# Patient Record
Sex: Male | Born: 1965
Health system: Southern US, Community
[De-identification: ages and names within clinical notes are randomized; demographics above are authoritative.]

## PROBLEM LIST (undated history)

## (undated) DIAGNOSIS — I10 Essential (primary) hypertension: Secondary | ICD-10-CM

## (undated) DIAGNOSIS — E785 Hyperlipidemia, unspecified: Secondary | ICD-10-CM

## (undated) HISTORY — DX: Hyperlipidemia, unspecified: E78.5

## (undated) HISTORY — DX: Essential (primary) hypertension: I10

---

## 2000-04-25 ENCOUNTER — Encounter: Payer: Self-pay | Admitting: Emergency Medicine

## 2000-04-25 ENCOUNTER — Emergency Department (HOSPITAL_COMMUNITY): Admission: EM | Admit: 2000-04-25 | Discharge: 2000-04-25 | Payer: Self-pay | Admitting: Emergency Medicine

## 2003-08-20 ENCOUNTER — Emergency Department (HOSPITAL_COMMUNITY): Admission: EM | Admit: 2003-08-20 | Discharge: 2003-08-21 | Payer: Self-pay | Admitting: Emergency Medicine

## 2006-11-23 ENCOUNTER — Ambulatory Visit: Payer: Self-pay | Admitting: Cardiovascular Disease

## 2006-11-24 ENCOUNTER — Ambulatory Visit: Payer: Self-pay | Admitting: Cardiovascular Disease

## 2006-11-24 LAB — CONVERTED CEMR LAB
ALT: 24 units/L (ref 0–40)
AST: 23 units/L (ref 0–37)
Albumin: 3.9 g/dL (ref 3.5–5.2)
Alkaline Phosphatase: 43 units/L (ref 39–117)
BUN: 8 mg/dL (ref 6–23)
Basophils Absolute: 0.1 10*3/uL (ref 0.0–0.1)
Basophils Relative: 0.8 % (ref 0.0–1.0)
Bilirubin, Direct: 0.1 mg/dL (ref 0.0–0.3)
CO2: 30 meq/L (ref 19–32)
Calcium: 9.7 mg/dL (ref 8.4–10.5)
Chloride: 102 meq/L (ref 96–112)
Cholesterol: 234 mg/dL (ref 0–200)
Creatinine, Ser: 1 mg/dL (ref 0.4–1.5)
Direct LDL: 79.8 mg/dL
Eosinophils Absolute: 0.2 10*3/uL (ref 0.0–0.6)
Eosinophils Relative: 2.9 % (ref 0.0–5.0)
GFR calc Af Amer: 106 mL/min
GFR calc non Af Amer: 88 mL/min
Glucose, Bld: 113 mg/dL — ABNORMAL HIGH (ref 70–99)
HCT: 41.5 % (ref 39.0–52.0)
HDL: 30.9 mg/dL — ABNORMAL LOW (ref >39.0)
Hemoglobin: 14.2 g/dL (ref 13.0–17.0)
Lymphocytes Relative: 48.7 % — ABNORMAL HIGH (ref 12.0–46.0)
MCHC: 34.2 g/dL (ref 30.0–36.0)
MCV: 81.8 fL (ref 78.0–100.0)
Monocytes Absolute: 0.7 10*3/uL (ref 0.2–0.7)
Monocytes Relative: 9.1 % (ref 3.0–11.0)
Neutro Abs: 2.9 10*3/uL (ref 1.4–7.7)
Neutrophils Relative %: 38.5 % — ABNORMAL LOW (ref 43.0–77.0)
Platelets: 326 10*3/uL (ref 150–400)
Potassium: 3.6 meq/L (ref 3.5–5.1)
RBC: 5.07 M/uL (ref 4.22–5.81)
RDW: 12.7 % (ref 11.5–14.6)
Sodium: 138 meq/L (ref 135–145)
TSH: 2.44 microintl units/mL (ref 0.35–5.50)
Total Bilirubin: 0.7 mg/dL (ref 0.3–1.2)
Total CHOL/HDL Ratio: 7.6
Total Protein: 7.5 g/dL (ref 6.0–8.3)
Triglycerides: 856 mg/dL (ref 0–149)
VLDL: 171 mg/dL — ABNORMAL HIGH (ref 0–40)
WBC: 7.6 10*3/uL (ref 4.5–10.5)

## 2006-12-07 ENCOUNTER — Ambulatory Visit: Payer: Self-pay | Admitting: Cardiovascular Disease

## 2007-03-06 ENCOUNTER — Ambulatory Visit: Payer: Self-pay | Admitting: Cardiovascular Disease

## 2007-03-06 ENCOUNTER — Ambulatory Visit: Payer: Self-pay

## 2007-03-06 LAB — CONVERTED CEMR LAB
ALT: 24 units/L (ref 0–40)
AST: 27 units/L (ref 0–37)
Albumin: 3.8 g/dL (ref 3.5–5.2)
Alkaline Phosphatase: 47 units/L (ref 39–117)
Bilirubin, Direct: 0.1 mg/dL (ref 0.0–0.3)
Cholesterol: 248 mg/dL (ref 0–200)
Direct LDL: 67.2 mg/dL
HDL: 27.4 mg/dL — ABNORMAL LOW (ref >39.0)
Total Bilirubin: 0.7 mg/dL (ref 0.3–1.2)
Total CHOL/HDL Ratio: 9.1
Total Protein: 7.8 g/dL (ref 6.0–8.3)
Triglycerides: 939 mg/dL (ref 0–149)
VLDL: 188 mg/dL — ABNORMAL HIGH (ref 0–40)

## 2008-06-24 ENCOUNTER — Ambulatory Visit: Payer: Self-pay | Admitting: Cardiovascular Disease

## 2008-06-27 ENCOUNTER — Ambulatory Visit: Payer: Self-pay | Admitting: Cardiovascular Disease

## 2008-06-27 LAB — CONVERTED CEMR LAB
ALT: 23 units/L (ref 0–53)
AST: 25 units/L (ref 0–37)
Albumin: 4.2 g/dL (ref 3.5–5.2)
Alkaline Phosphatase: 37 units/L — ABNORMAL LOW (ref 39–117)
BUN: 15 mg/dL (ref 6–23)
Bilirubin, Direct: 0.1 mg/dL (ref 0.0–0.3)
CO2: 28 meq/L (ref 19–32)
Calcium: 9.5 mg/dL (ref 8.4–10.5)
Chloride: 101 meq/L (ref 96–112)
Cholesterol: 296 mg/dL (ref 0–200)
Creatinine, Ser: 1.1 mg/dL (ref 0.4–1.5)
Direct LDL: 75.9 mg/dL
GFR calc Af Amer: 94 mL/min
GFR calc non Af Amer: 78 mL/min
Glucose, Bld: 124 mg/dL — ABNORMAL HIGH (ref 70–99)
HDL: 29.4 mg/dL — ABNORMAL LOW (ref >39.0)
Potassium: 3.7 meq/L (ref 3.5–5.1)
Sodium: 137 meq/L (ref 135–145)
Total Bilirubin: 0.8 mg/dL (ref 0.3–1.2)
Total CHOL/HDL Ratio: 10.1
Total Protein: 7.8 g/dL (ref 6.0–8.3)
Triglycerides: 1325 mg/dL (ref 0–149)
VLDL: 265 mg/dL — ABNORMAL HIGH (ref 0–40)

## 2008-10-01 ENCOUNTER — Emergency Department (HOSPITAL_BASED_OUTPATIENT_CLINIC_OR_DEPARTMENT_OTHER): Admission: EM | Admit: 2008-10-01 | Discharge: 2008-10-01 | Payer: Self-pay | Admitting: Emergency Medicine

## 2008-10-01 ENCOUNTER — Ambulatory Visit: Payer: Self-pay | Admitting: Diagnostic Radiology

## 2009-10-16 DIAGNOSIS — E781 Pure hyperglyceridemia: Secondary | ICD-10-CM | POA: Insufficient documentation

## 2009-10-16 DIAGNOSIS — I10 Essential (primary) hypertension: Secondary | ICD-10-CM | POA: Insufficient documentation

## 2009-10-29 ENCOUNTER — Ambulatory Visit: Payer: Self-pay | Admitting: Cardiovascular Disease

## 2010-02-11 ENCOUNTER — Encounter (INDEPENDENT_AMBULATORY_CARE_PROVIDER_SITE_OTHER): Payer: Self-pay | Admitting: *Deleted

## 2010-07-07 ENCOUNTER — Ambulatory Visit: Payer: Self-pay | Admitting: Cardiovascular Disease

## 2010-07-09 ENCOUNTER — Ambulatory Visit: Payer: Self-pay | Admitting: Cardiovascular Disease

## 2010-07-10 ENCOUNTER — Ambulatory Visit: Payer: Self-pay | Admitting: Internal Medicine

## 2010-07-10 DIAGNOSIS — E118 Type 2 diabetes mellitus with unspecified complications: Secondary | ICD-10-CM | POA: Insufficient documentation

## 2010-07-10 LAB — CONVERTED CEMR LAB
ALT: 43 units/L (ref 0–53)
AST: 28 units/L (ref 0–37)
Albumin: 4.9 g/dL (ref 3.5–5.2)
BUN: 16 mg/dL (ref 6–23)
Basophils Absolute: 0 10*3/uL (ref 0.0–0.1)
Bilirubin Urine: NEGATIVE
CO2: 28 meq/L (ref 19–32)
Calcium: 9.7 mg/dL (ref 8.4–10.5)
Chloride: 96 meq/L (ref 96–112)
Cholesterol: 586 mg/dL — ABNORMAL HIGH (ref 0–200)
Creatinine, Ser: 0.8 mg/dL (ref 0.4–1.5)
Creatinine,U: 164 mg/dL
GFR calc non Af Amer: 132.8 mL/min (ref >60)
Glucose, Bld: 204 mg/dL — ABNORMAL HIGH (ref 70–99)
HDL: 21 mg/dL — ABNORMAL LOW (ref >39)
Hemoglobin: 14.1 g/dL (ref 13.0–17.0)
Hgb A1c MFr Bld: 13.2 % — ABNORMAL HIGH (ref 4.6–6.5)
Ketones, ur: NEGATIVE mg/dL
Leukocytes, UA: NEGATIVE
Lymphocytes Relative: 49 % — ABNORMAL HIGH (ref 12–46)
Microalb Creat Ratio: 11.2 mg/g (ref 0.0–30.0)
Microalb, Ur: 18.4 mg/dL — ABNORMAL HIGH (ref 0.0–1.9)
Neutro Abs: 3.4 10*3/uL (ref 1.7–7.7)
Nitrite: NEGATIVE
Platelets: 416 10*3/uL — ABNORMAL HIGH (ref 150–400)
Potassium: 4.1 meq/L (ref 3.5–5.1)
RDW: 14.1 % (ref 11.5–15.5)
Sodium: 133 meq/L — ABNORMAL LOW (ref 135–145)
Specific Gravity, Urine: 1.025 (ref 1.000–1.030)
Total CHOL/HDL Ratio: 27.9
Total Protein, Urine: 30 mg/dL
Urine Glucose: NEGATIVE mg/dL
Urobilinogen, UA: 0.2 (ref 0.0–1.0)
pH: 6 (ref 5.0–8.0)

## 2010-07-27 ENCOUNTER — Ambulatory Visit: Payer: Self-pay | Admitting: Internal Medicine

## 2010-07-27 DIAGNOSIS — R809 Proteinuria, unspecified: Secondary | ICD-10-CM | POA: Insufficient documentation

## 2010-07-27 LAB — CONVERTED CEMR LAB: Cholesterol, target level: 200 mg/dL

## 2010-08-06 ENCOUNTER — Ambulatory Visit: Payer: Self-pay | Admitting: Cardiovascular Disease

## 2010-08-06 DIAGNOSIS — E785 Hyperlipidemia, unspecified: Secondary | ICD-10-CM | POA: Insufficient documentation

## 2010-08-06 LAB — CONVERTED CEMR LAB
ALT: 26 units/L (ref 0–53)
Bilirubin, Direct: 0 mg/dL (ref 0.0–0.3)
Cholesterol: 219 mg/dL — ABNORMAL HIGH (ref 0–200)
Direct LDL: 124.2 mg/dL
Total Protein: 7.8 g/dL (ref 6.0–8.3)
Triglycerides: 282 mg/dL — ABNORMAL HIGH (ref 0.0–149.0)

## 2010-08-13 ENCOUNTER — Ambulatory Visit: Payer: Self-pay | Admitting: Cardiology

## 2010-08-31 ENCOUNTER — Ambulatory Visit: Payer: Self-pay | Admitting: Endocrinology

## 2010-09-14 ENCOUNTER — Ambulatory Visit: Payer: Self-pay | Admitting: Cardiology

## 2010-09-14 LAB — CONVERTED CEMR LAB
AST: 22 units/L (ref 0–37)
Albumin: 4.3 g/dL (ref 3.5–5.2)
Cholesterol: 170 mg/dL (ref 0–200)
HDL: 30.9 mg/dL — ABNORMAL LOW (ref >39.00)
Total CHOL/HDL Ratio: 6
Total Protein: 7.8 g/dL (ref 6.0–8.3)
Triglycerides: 185 mg/dL — ABNORMAL HIGH (ref 0.0–149.0)

## 2010-09-21 ENCOUNTER — Ambulatory Visit: Payer: Self-pay | Admitting: Cardiovascular Disease

## 2010-10-14 ENCOUNTER — Encounter: Payer: Self-pay | Admitting: Cardiovascular Disease

## 2010-10-14 ENCOUNTER — Encounter: Admit: 2010-10-14 | Payer: Self-pay | Admitting: Cardiovascular Disease

## 2010-11-01 LAB — CONVERTED CEMR LAB
ALT: 25 units/L (ref 0–53)
AST: 23 units/L (ref 0–37)
Albumin: 4.2 g/dL (ref 3.5–5.2)
Alkaline Phosphatase: 39 units/L (ref 39–117)
BUN: 18 mg/dL (ref 6–23)
BUN: 9 mg/dL (ref 6–23)
Bilirubin, Direct: 0 mg/dL (ref 0.0–0.3)
CO2: 25 meq/L (ref 19–32)
CO2: 27 meq/L (ref 19–32)
Calcium: 9.5 mg/dL (ref 8.4–10.5)
Calcium: 9.9 mg/dL (ref 8.4–10.5)
Chloride: 101 meq/L (ref 96–112)
Chloride: 94 meq/L — ABNORMAL LOW (ref 96–112)
Cholesterol: 287 mg/dL — ABNORMAL HIGH (ref 0–200)
Creatinine, Ser: 0.8 mg/dL (ref 0.4–1.5)
Creatinine, Ser: 0.8 mg/dL (ref 0.4–1.5)
Direct LDL: 42.4 mg/dL
GFR calc non Af Amer: 127.34 mL/min (ref >60)
GFR calc non Af Amer: 135.14 mL/min (ref >60)
Glucose, Bld: 133 mg/dL — ABNORMAL HIGH (ref 70–99)
Glucose, Bld: 339 mg/dL — ABNORMAL HIGH (ref 70–99)
HDL: 41.4 mg/dL (ref >39.00)
Potassium: 3.6 meq/L (ref 3.5–5.1)
Potassium: 4.1 meq/L (ref 3.5–5.1)
Sodium: 129 meq/L — ABNORMAL LOW (ref 135–145)
Sodium: 137 meq/L (ref 135–145)
TSH: 1.05 microintl units/mL (ref 0.35–5.50)
Total Bilirubin: 0.6 mg/dL (ref 0.3–1.2)
Total CHOL/HDL Ratio: 7
Total Protein: 7.7 g/dL (ref 6.0–8.3)
Triglycerides: 1624 mg/dL — ABNORMAL HIGH (ref 0.0–149.0)
VLDL: 324.8 mg/dL — ABNORMAL HIGH (ref 0.0–40.0)

## 2010-11-05 NOTE — Assessment & Plan Note (Signed)
Summary: 2 wk f/u #/cd   Vital Signs:  Patient profile:   45 year old male Height:      67 inches Weight:      204 pounds BMI:     32.07 O2 Sat:      98 % on Room air Temp:     98.4 degrees F oral Pulse rate:   64 / minute Pulse rhythm:   regular Resp:     16 per minute BP sitting:   118 / 80  (left arm) Cuff size:   large  Vitals Entered By: Rock Nephew CMA (July 27, 2010 10:14 AM)  Nutrition Counseling: Patient's BMI is greater than 25 and therefore counseled on weight management options.  O2 Flow:  Room air CC: follow-up visit, Lipid Management Is Patient Diabetic? Yes Did you bring your meter with you today? No Pain Assessment Patient in pain? no       Does patient need assistance? Functional Status Self care Ambulation Normal   Primary Care Provider:  Etta Grandchild MD  CC:  follow-up visit and Lipid Management.  History of Present Illness:  Follow-Up Visit      This is a 45 year old man who presents for Follow-up visit.  The patient denies chest pain, palpitations, dizziness, syncope, low blood sugar symptoms, high blood sugar symptoms, edema, SOB, DOE, PND, and orthopnea.  Since the last visit the patient notes no new problems or concerns.  The patient reports taking meds as prescribed, monitoring BP, monitoring blood sugars, and dietary compliance.  When questioned about possible medication side effects, the patient notes none.    Lipid Management History:      Positive NCEP/ATP III risk factors include diabetes, HDL cholesterol less than 40, and hypertension.  Negative NCEP/ATP III risk factors include male age less than 10 years old, no family history for ischemic heart disease, non-tobacco-user status, no ASHD (atherosclerotic heart disease), no prior stroke/TIA, no peripheral vascular disease, and no history of aortic aneurysm.        The patient states that he knows about the "Therapeutic Lifestyle Change" diet.  His compliance with the TLC diet is  fair.  The patient expresses understanding of adjunctive measures for cholesterol lowering.  Adjunctive measures started by the patient include aerobic exercise, fiber, limit alcohol consumpton, and weight reduction.  He expresses no side effects from his lipid-lowering medication.  The patient denies any symptoms to suggest myopathy or liver disease.     Preventive Screening-Counseling & Management  Alcohol-Tobacco     Alcohol drinks/day: 0     Alcohol Counseling: not indicated; patient does not drink     Smoking Status: never     Tobacco Counseling: not indicated; no tobacco use  Hep-HIV-STD-Contraception     Hepatitis Risk: no risk noted     HIV Risk: no risk noted     STD Risk: no risk noted      Sexual History:  currently monogamous.        Drug Use:  never.        Blood Transfusions:  no.    Clinical Review Panels:  Immunizations   Last Tetanus Booster:  Tdap (07/10/2010)   Last Flu Vaccine:  Fluvax 3+ (07/10/2010)  Lipid Management   Cholesterol:  586 (07/10/2010)   LDL (bad choesterol):  NOT CALC mg/dL (16/07/9603)   HDL (good cholesterol):  21 (07/10/2010)  Diabetes Management   HgBA1C:  13.2 (07/10/2010)   Creatinine:  0.8 (07/10/2010)   Last  Foot Exam:  yes (07/27/2010)   Last Flu Vaccine:  Fluvax 3+ (07/10/2010)  CBC   WBC:  8.3 (07/10/2010)   RBC:  4.79 (07/10/2010)   Hgb:  14.1 (07/10/2010)   Hct:  38.3 (07/10/2010)   Platelets:  416 (07/10/2010)   MCV  80.0 (07/10/2010)   MCHC  36.8 (07/10/2010)   RDW  14.1 (07/10/2010)   PMN:  41 (07/10/2010)   Lymphs:  49 (07/10/2010)   Monos:  8 (07/10/2010)   Eosinophils:  1 (07/10/2010)   Basophil:  1 (07/10/2010)  Complete Metabolic Panel   Glucose:  204 (07/10/2010)   Sodium:  133 Lipemic mEq/L (07/10/2010)   Potassium:  4.1 (07/10/2010)   Chloride:  96 (07/10/2010)   CO2:  28 (07/10/2010)   BUN:  16 (07/10/2010)   Creatinine:  0.8 (07/10/2010)   Albumin:  4.9 (07/10/2010)   Total Protein:  7.8  (07/10/2010)   Calcium:  9.7 (07/10/2010)   Total Bili:  0.4 (07/10/2010)   Alk Phos:  47 (07/10/2010)   SGPT (ALT):  43 (07/10/2010)   SGOT (AST):  28 (07/10/2010)   Medications Prior to Update: 1)  Glucosamine-Chondroitin  Caps (Glucosamine-Chondroit-Vit C-Mn) .... Once A Week 2)  Cayenne Pepper Vitamin Cap .Marland Kitchen.. 3 X A Week 3)  Fish Oil 1000 Mg Caps (Omega-3 Fatty Acids) .... Take 4 Capsules By Mouth Daily 4)  Tricor 145 Mg Tabs (Fenofibrate) .... Take One Tablet By Mouth Daily 5)  Aspirin Ec 325 Mg Tbec (Aspirin) .... Take One Tablet By Mouth Daily 6)  Lantus Solostar 100 Unit/ml Soln (Insulin Glargine) .... Inject 10 Units Subcutaneously Once Daily 7)  Pen Needles 5/16" 31g X 8 Mm Misc (Insulin Pen Needle) .... As Directed 8)  Onetouch Ultra 2 W/device Kit (Blood Glucose Monitoring Suppl) .... Use Two Times A Day To Test Blood Sugar 9)  Onetouch Ultra Blue  Strp (Glucose Blood) .... Use Two Times A Day As Directed  Current Medications (verified): 1)  Glucosamine-Chondroitin  Caps (Glucosamine-Chondroit-Vit C-Mn) .... Once A Week 2)  Cayenne Pepper Vitamin Cap .Marland Kitchen.. 3 X A Week 3)  Fish Oil 1000 Mg Caps (Omega-3 Fatty Acids) .... Take 4 Capsules By Mouth Daily 4)  Tricor 145 Mg Tabs (Fenofibrate) .... Take One Tablet By Mouth Daily 5)  Aspirin Ec 325 Mg Tbec (Aspirin) .... Take One Tablet By Mouth Daily 6)  Lantus Solostar 100 Unit/ml Soln (Insulin Glargine) .... Inject 10 Units Subcutaneously Once Daily 7)  Pen Needles 5/16" 31g X 8 Mm Misc (Insulin Pen Needle) .... As Directed 8)  Onetouch Ultra 2 W/device Kit (Blood Glucose Monitoring Suppl) .... Use Two Times A Day To Test Blood Sugar 9)  Onetouch Ultra Blue  Strp (Glucose Blood) .... Use Two Times A Day As Directed 10)  Janumet 50-1000 Mg Tabs (Sitagliptin-Metformin Hcl) .... One By Mouth Two Times A Day For Diabetes 11)  Crestor 20 Mg Tabs (Rosuvastatin Calcium) .... One By Mouth Once Daily 12)  Ramipril 2.5 Mg Caps (Ramipril)  .... One By Mouth Once Daily For Kidneys  Allergies (verified): No Known Drug Allergies  Past History:  Past Medical History: Last updated: 07/09/2010 Current Problems:  HYPERTENSION, UNSPECIFIED (ICD-401.9) HYPERTRIGLYCERIDEMIA (ICD-272.1), markedly elevated DIABETES  Past Surgical History: Last updated: 10/16/2009 None  Family History: Last updated: 07/10/2010   Pertinent in that his brother died at age 18 from  congestive heart failure and his father had his first myocardial  infarction at age 10 and died at age 50 of  congestive heart failure. 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 He drinks alcohol on occasion, but not in excess. He does not smoke cigarettes. Married Regular exercise-yes  Risk Factors: Alcohol Use: 0 (07/27/2010) Exercise: yes (07/10/2010)  Risk Factors: Smoking Status: never (07/27/2010)  Family History: Reviewed history from 07/10/2010 and no changes required.   Pertinent in that his brother died at age 49 from  congestive heart failure and his father had his first myocardial  infarction at age 14 and died at age 25 of congestive heart failure. Family History Diabetes 1st degree relative Family History Hypertension  Social History: Reviewed history from 07/10/2010 and no changes required. 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 He drinks alcohol on occasion, but not in excess. He does not smoke cigarettes. Married Regular exercise-yes Hepatitis Risk:  no risk noted HIV Risk:  no risk noted STD Risk:  no risk noted Sexual History:  currently monogamous Drug Use:  never Blood Transfusions:  no  Review of Systems  The patient denies anorexia, fever, weight loss, weight gain, chest pain, syncope, dyspnea on  exertion, peripheral edema, prolonged cough, headaches, hemoptysis, abdominal pain, and difficulty walking.   Endo:  Denies cold intolerance, excessive hunger, excessive thirst, excessive urination, heat intolerance, polyuria, and weight change.  Physical Exam  General:  alert, well-developed, well-nourished, well-hydrated, cooperative to examination, good hygiene, and overweight-appearing.   Mouth:  Oral mucosa and oropharynx without lesions or exudates.  Teeth in good repair. Neck:  No deformities, masses, or tenderness noted. Lungs:  normal respiratory effort, no intercostal retractions, no accessory muscle use, normal breath sounds, no dullness, no fremitus, no crackles, and no wheezes.   Heart:  Normal rate and regular rhythm. S1 and S2 normal without gallop, murmur, click, rub or other extra sounds. Abdomen:  soft, non-tender, normal bowel sounds, no distention, no masses, no guarding, no rigidity, no rebound tenderness, no abdominal hernia, no inguinal hernia, no hepatomegaly, and no splenomegaly.   Msk:  No deformity or scoliosis noted of thoracic or lumbar spine.   Pulses:  R and L carotid,radial,femoral,dorsalis pedis and posterior tibial pulses are full and equal bilaterally Extremities:  No clubbing, cyanosis, edema, or deformity noted with normal full range of motion of all joints.   Neurologic:  No cranial nerve deficits noted. Station and gait are normal. Plantar reflexes are down-going bilaterally. DTRs are symmetrical throughout. Sensory, motor and coordinative functions appear intact. Skin:  Intact without suspicious lesions or rashes Cervical Nodes:  No lymphadenopathy noted Psych:  Cognition and judgment appear intact. Alert and cooperative with normal attention span and concentration. No apparent delusions, illusions, hallucinations  Diabetes Management Exam:    Foot Exam (with socks and/or shoes not present):       Sensory-Pinprick/Light touch:          Left medial foot  (L-4): normal          Left dorsal foot (L-5): normal          Left lateral foot (S-1): normal          Right medial foot (L-4): normal          Right dorsal foot (L-5): normal          Right lateral foot (S-1): normal       Sensory-Monofilament:  Left foot: normal          Right foot: normal       Inspection:          Left foot: normal          Right foot: normal       Nails:          Left foot: normal          Right foot: normal   Impression & Recommendations:  Problem # 1:  MICROALBUMINURIA (ICD-791.0) start altace  Problem # 2:  DIABETES MELLITUS, TYPE II, ON INSULIN (ICD-250.00) Assessment: Deteriorated  His updated medication list for this problem includes:    Aspirin Ec 325 Mg Tbec (Aspirin) .Marland Kitchen... Take one tablet by mouth daily    Lantus Solostar 100 Unit/ml Soln (Insulin glargine) ..... Inject 10 units subcutaneously once daily    Janumet 50-1000 Mg Tabs (Sitagliptin-metformin hcl) ..... One by mouth two times a day for diabetes    Ramipril 2.5 Mg Caps (Ramipril) ..... One by mouth once daily for kidneys  Labs Reviewed: Creat: 0.8 (07/10/2010)    Reviewed HgBA1c results: 13.2 (07/10/2010)  Orders: Endocrinology Referral (Endocrine) Ophthalmology Referral (Ophthalmology)  Problem # 3:  HYPERTRIGLYCERIDEMIA (ICD-272.1) Assessment: Deteriorated his lipids are unusual and severe, I will ask Dr. Everardo All to make a recommendation His updated medication list for this problem includes:    Tricor 145 Mg Tabs (Fenofibrate) .Marland Kitchen... Take one tablet by mouth daily    Crestor 20 Mg Tabs (Rosuvastatin calcium) ..... One by mouth once daily  Orders: Endocrinology Referral (Endocrine)  Problem # 4:  HYPERTENSION, UNSPECIFIED (ICD-401.9) Assessment: Improved  His updated medication list for this problem includes:    Ramipril 2.5 Mg Caps (Ramipril) ..... One by mouth once daily for kidneys  BP today: 118/80 Prior BP: 120/82 (07/10/2010)  Prior 10 Yr Risk Heart  Disease: Not enough information (07/10/2010)  Labs Reviewed: K+: 4.1 (07/10/2010) Creat: : 0.8 (07/10/2010)   Chol: 586 (07/10/2010)   HDL: 21 (07/10/2010)   LDL: NOT CALC mg/dL (74/25/9563)   TG: 8756 (07/10/2010)  Complete Medication List: 1)  Glucosamine-chondroitin Caps (Glucosamine-chondroit-vit c-mn) .... Once a week 2)  Cayenne Pepper Vitamin Cap  .Marland Kitchen.. 3 x a week 3)  Fish Oil 1000 Mg Caps (Omega-3 fatty acids) .... Take 4 capsules by mouth daily 4)  Tricor 145 Mg Tabs (Fenofibrate) .... Take one tablet by mouth daily 5)  Aspirin Ec 325 Mg Tbec (Aspirin) .... Take one tablet by mouth daily 6)  Lantus Solostar 100 Unit/ml Soln (Insulin glargine) .... Inject 10 units subcutaneously once daily 7)  Pen Needles 5/16" 31g X 8 Mm Misc (Insulin pen needle) .... As directed 8)  Onetouch Ultra 2 W/device Kit (Blood glucose monitoring suppl) .... Use two times a day to test blood sugar 9)  Onetouch Ultra Blue Strp (Glucose blood) .... Use two times a day as directed 10)  Janumet 50-1000 Mg Tabs (Sitagliptin-metformin hcl) .... One by mouth two times a day for diabetes 11)  Crestor 20 Mg Tabs (Rosuvastatin calcium) .... One by mouth once daily 12)  Ramipril 2.5 Mg Caps (Ramipril) .... One by mouth once daily for kidneys  Lipid Assessment/Plan:      Based on NCEP/ATP III, the patient's risk factor category is "history of diabetes".  The patient's lipid goals are as follows: Total cholesterol goal is 200; LDL cholesterol goal is 100; HDL cholesterol goal is 40; Triglyceride goal is 150.    Patient Instructions: 1)  Please schedule a follow-up appointment in 3 months. 2)  It is important that you exercise regularly at least 20 minutes 5 times a week. If you develop chest pain, have severe difficulty breathing, or feel very tired , stop exercising immediately and seek medical attention. 3)  You need to lose weight. Consider a lower calorie diet and regular exercise.  4)  Check your blood sugars  regularly. If your readings are usually above 200 or below 70 you should contact our office. 5)  It is important that your Diabetic A1c level is checked every 3 months. 6)  See your eye doctor yearly to check for diabetic eye damage. 7)  Check your feet each night for sore areas, calluses or signs of infection. Prescriptions: RAMIPRIL 2.5 MG CAPS (RAMIPRIL) One by mouth once daily for kidneys  #30 x 11   Entered and Authorized by:   Etta Grandchild MD   Signed by:   Etta Grandchild MD on 07/27/2010   Method used:   Electronically to        [facility] [address] #[id number]* (retail)       [address]       [place], [place]  [address]       Ph: [phone]       Fax: [phone]   RxID:   [id number] CRESTOR 20 MG TABS (ROSUVASTATIN CALCIUM) One by mouth once daily  #70 x 0   Entered and Authorized by:   Etta Grandchild MD   Signed by:   Etta Grandchild MD on 07/27/2010   Method used:   Samples Given   RxID:   [id number] JANUMET 50-1000 MG TABS (SITAGLIPTIN-METFORMIN HCL) One by mouth two times a day for diabetes  #224 x 0   Entered and Authorized by:   Etta Grandchild MD   Signed by:   Etta Grandchild MD on 07/27/2010   Method used:   Samples Given   RxID:   [id number]    Orders Added: 1)  Endocrinology Referral [Endocrine] 2)  Ophthalmology Referral [Ophthalmology] 3)  Est. Patient Level IV [55732]

## 2010-11-05 NOTE — Assessment & Plan Note (Signed)
Summary: rov/sp         This is a 45 year old male who presents for follow-up in Lipid Clinic.  He has been compliant with Tricor, Crestor, and fish oil.   He has seen Dr. Everardo All for management of his DM.  His Lantus has been decreased to 5 units daily.  He is checking his blood sugars.  His fructosamine was 284, which corresponds to an A1c  ~6.4.  He has no complaints of muscle pain, aches, chest pain or SOB.      Pt has continued to follow the dietary changes he made prior to last visit.  He continues to avoid pasta and decreased the amount of bread he eats.  He has continued to eat vegetables such as brocolli and salads and started incorporating more fruits such as grapes.  He  has tried a few "cheat" meals to learn what will cause his BG to rise.      Exercise is on the basketball court.  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  He will work out and do drills for about 30 minutes 4 days a week.   Lipid Management Provider  Weston Brass, PharmD  Current Medications (verified): 1)  Cayenne Pepper Vitamin Cap .Marland Kitchen.. 3 X A Week 2)  Fish Oil 1000 Mg Caps (Omega-3 Fatty Acids) .... Take 4 Capsules By Mouth Daily 3)  Tricor 145 Mg Tabs (Fenofibrate) .... Take One Tablet By Mouth Daily 4)  Aspirin Ec 325 Mg Tbec (Aspirin) .... Take One Tablet By Mouth Daily 5)  Lantus Solostar 100 Unit/ml Soln (Insulin Glargine) .... Inject 5 Units Subcutaneously Once Daily 6)  Pen Needles 5/16" 31g X 8 Mm Misc (Insulin Pen Needle) .... As Directed 7)  Onetouch Ultra 2 W/device Kit (Blood Glucose Monitoring Suppl) .... Use Two Times A Day To Test Blood Sugar 8)  Onetouch Ultra Blue  Strp (Glucose Blood) .... Use Two Times A Day As Directed 9)  Crestor 20 Mg Tabs (Rosuvastatin Calcium) .... One By Mouth Once Daily 10)  Ramipril 2.5 Mg Caps (Ramipril) .... One By Mouth Once Daily For Kidneys  Allergies: No Known Drug Allergies   Vital Signs:  Patient profile:   45 year old male BP  sitting:   132 / 100  Impression & Recommendations:  Problem # 1:  PURE HYPERCHOLESTEROLEMIA (ICD-272.0) Assessment Improved Pt's cholesterol continues to improve with the addition of Crestor in November.  TC- 170 (goal<200), TG- 185 (goal<150), HDL- 30.9 (goal>40), LDL- 409 (goal<70).  LFTs are WNL.  Pt has no issues tolerating medications.  He is very compliant with a low-carbohydrate diet as well as exercise.  WIll continue current medications and see if there is any additional decrease in LDL.  If LDL remains elevated, will consider increasing Crestor to 40mg .  Will recheck labs in 3 months.    His updated medication list for this problem includes:    Tricor 145 Mg Tabs (Fenofibrate) .Marland Kitchen... Take one tablet by mouth daily    Crestor 20 Mg Tabs (Rosuvastatin calcium) ..... One by mouth once daily  Patient Instructions: 1)  Great job on your progress!  2)  Continue current medications. 3)  Try to continue to exercise as much as possible 4)  Lab Appt: 12/21/2010 at 9am (fasting) 5)  Lipid Clinic Appt: 12/24/10 at 10:30  Prescriptions: CRESTOR 20 MG TABS (ROSUVASTATIN CALCIUM) One by mouth once daily  #30 x 6   Entered by:  Weston Brass PharmD   Authorized by:   Norva Karvonen, MD   Signed by:   Weston Brass PharmD on 10/01/2010   Method used:   Electronically to        [id number]* (retail)       [address]       [place], [place]  [address]       Ph: [phone]       Fax: [phone]   RxID:   [id number]

## 2010-11-05 NOTE — Assessment & Plan Note (Signed)
Summary: ROV  Medications Added CHOLESTEROL DEFENSE  TABS (NUTRITIONAL SUPPLEMENTS) Take 1 tablet by mouth once a day GLUCOSAMINE 500 MG CAPS (GLUCOSAMINE SULFATE) Take 1 capsule by mouth once a day ASPIRIN 81 MG TBEC (ASPIRIN) Take one tablet by mouth daily NIASPAN 500 MG CR-TABS (NIACIN (ANTIHYPERLIPIDEMIC)) take 1 tab by mouth q hs x4weeks then Niaspan 1 gram x 4 weeks      Allergies Added: NKDA  Visit Type:  Follow-up Primary Provider:  none  CC:  Heart burn.  History of Present Illness: This is a 45 year-old male with familial hyperlipidemia, predomint pattern of high triglycerides and low HDL cholesterol, presenting today for follow-up evaluation. He was last seen in September 2009 and discontinued his medication since then. His triglycerides have been greater than 1000 without medication. He has tolerated Niaspan but discontinued it because the prescription ran out. He follows a fat restricted diet, does not exercise.  The patient denies chest pain, dyspnea, orthopnea, PND, edema, palpitations, lightheadedness, or syncope.  He has previously had problems with GERD but no symptoms recently.  Current Medications (verified): 1)  Cholesterol Defense  Tabs (Nutritional Supplements) .... Take 1 Tablet By Mouth Once A Day 2)  Glucosamine 500 Mg Caps (Glucosamine Sulfate) .... Take 1 Capsule By Mouth Once A Day 3)  Aspirin 81 Mg Tbec (Aspirin) .... Take One Tablet By Mouth Daily 4)  Niaspan 500 Mg Cr-Tabs (Niacin (Antihyperlipidemic)) .... Take 1 Tab By Mouth Q Hs X4weeks Then Niaspan 1 Gram X 4 Weeks  Allergies (verified): No Known Drug Allergies  Past History:  Past medical history reviewed for relevance to current acute and chronic problems.  Past Medical History: Reviewed history from 10/16/2009 and no changes required. Current Problems:  HYPERTENSION, UNSPECIFIED (ICD-401.9) HYPERTRIGLYCERIDEMIA (ICD-272.1) PREDIABETES (ICD-790.29)  Review of Systems       Positive  for knee pain, otherwise negative except as per HPI   Vital Signs:  Patient profile:   45 year old male Height:      67 inches Weight:      221 pounds BMI:     34.74 Pulse rate:   79 / minute Pulse rhythm:   regular Resp:     18 per minute BP sitting:   134 / 90  (left arm) Cuff size:   large  Vitals Entered By: Vikki Ports (October 29, 2009 9:21 AM)  Physical Exam  General:  Pt is alert and oriented, obese, African-American male, in no acute distress. HEENT: normal Neck: normal carotid upstrokes without bruits, JVP normal Lungs: CTA CV: RRR without murmur or gallop Abd: soft, NT, positive BS, no bruit, no organomegaly Ext: no clubbing, cyanosis, or edema. peripheral pulses 2+ and equal Skin: warm and dry without rash    EKG  Procedure date:  10/29/2009  Findings:      NSR with sinus arrhythmia, LAD, otherwise WNL, HR 79 bpm  Impression & Recommendations:  Problem # 1:  HYPERTRIGLYCERIDEMIA (ICD-272.1) Long discussion with the patient today. He needs to take his lipid disorder seriously as he has a very bad family history of aggressive and premature CAD. I have recommend restarting Niaspan 500 mg daily and increasing by 500 mg every 4 wks - will check lipids today and repeat in 12 wks to assess treatment effect. Also reviewed initiaves for an exercise program and dietary changes. He was advised to take an ASA 81 mg prior to the Niaspan dose.  His updated medication list for this problem includes:    Niaspan  500 Mg Cr-tabs (Niacin (antihyperlipidemic)) .Marland Kitchen... Take 1 tab by mouth q hs x4weeks then niaspan 1 gram x 4 weeks  Orders: Treadmill (Treadmill) TLB-Lipid Panel (80061-LIPID) TLB-Hepatic/Liver Function Pnl (80076-HEPATIC) TLB-BMP (Basic Metabolic Panel-BMET) (80048-METABOL)  CHOL: 296 (06/27/2008)   LDL: DEL (06/27/2008)   HDL: 29.4 (06/27/2008)   TG: 1325 (06/27/2008)  Problem # 2:  PREDIABETES (ICD-790.29) Check a fasting glucose today with other labwork.  Restrict carbohydrates.  Problem # 3:  HYPERTENSION, UNSPECIFIED (ICD-401.9) BP borderline. Follow - does not require treatment at present.  His updated medication list for this problem includes:    Aspirin 81 Mg Tbec (Aspirin) .Marland Kitchen... Take one tablet by mouth daily  Orders: Treadmill (Treadmill) TLB-BMP (Basic Metabolic Panel-BMET) (80048-METABOL) TLB-TSH (Thyroid Stimulating Hormone) (84443-TSH)  BP today: 134/90  Labs Reviewed: K+: 3.7 (06/27/2008) Creat: : 1.1 (06/27/2008)   Chol: 296 (06/27/2008)   HDL: 29.4 (06/27/2008)   LDL: DEL (06/27/2008)   TG: 1325 (06/27/2008)  Patient Instructions: 1)  Your physician recommends that you schedule a follow-up appointment in: one year 2)  Your physician has recommended you make the following change in your medication: please start Niaspan 500mg  every night for 4 weeks  tnen Niaspan 0ne gram everynight for 4weeks  then have fasting lipid/liver panels drawn--start asa 81mg  every day 3)  Your physician has requested that you have an exercise tolerance test.  For further information please visit [email or web address]  Please also follow instruction sheet, as given. Prescriptions: NIASPAN 500 MG CR-TABS (NIACIN (ANTIHYPERLIPIDEMIC)) take 1 tab by mouth q hs x4weeks then Niaspan 1 gram x 4 weeks  #90 x 3   Entered by:   Ledon Snare, RN   Authorized by:   Norva Karvonen, MD   Signed by:   Norva Karvonen, MD on 10/29/2009   Method used:   Electronically to        [facility] [address] [id number]* (retail)       [address] [place], [place]  [address]       Ph: [phone]       Fax: [phone]   RxID:   [id number]

## 2010-11-05 NOTE — Letter (Signed)
Summary: Appointment - Missed  [facility], Main Office  [address]   [place], [place] [address]   Phone: [phone]  Fax: [phone]     Feb 11, 2010 MRN: [id number]   Tim Wilson [address] [place], [place]  [address]   Dear [name],  Our records indicate you missed your appointment on 01/13/2010 with Dr. Excell Seltzer. It is very important that we reach you to reschedule this appointment. We look forward to participating in your health care needs. Please contact us at the number listed above at your earliest convenience to reschedule this appointment.     Sincerely, Neurosurgeon Team LG

## 2010-11-05 NOTE — Assessment & Plan Note (Signed)
Summary: ecn/dm/bcbs/jones referred/jss   Vital Signs:  Patient profile:   45 year old male Height:      67 inches (170.18 cm) Weight:      208.50 pounds (94.77 kg) BMI:     32.77 O2 Sat:      97 % on Room air Temp:     98.4 degrees F (36.89 degrees C) oral Pulse rate:   88 / minute Pulse rhythm:   regular BP sitting:   112 / 72  (left arm) Cuff size:   large  Vitals Entered By: Brenton Grills CMA Duncan Dull) (August 31, 2010 10:46 AM)  O2 Flow:  Room air CC: New Endo Consult/DMII/Dr. Jones/aj Is Patient Diabetic? Yes   Primary Provider:  Etta Grandchild MD  CC:  New Endo Consult/DMII/Dr. Jones/aj.  History of Present Illness: pt states 1 month h/o dm.  it is complicated by microalbuminuria.  he was started on insulin, as he had severe hyperglycemia.  he takes lantus 10 units qd.   no cbg record, but states cbg's are 90-130.  it is in general higher later in the day. pt says his diet and exercise are "good." symptomatically, pt states he had a few weeks of slight tingling of the legs, and associated weight loss.  sxs are much better since he has been on the insulin, but he wants to change to oral med for dm if at all possible.    Current Medications (verified): 1)  Cayenne Pepper Vitamin Cap .Marland Kitchen.. 3 X A Week 2)  Fish Oil 1000 Mg Caps (Omega-3 Fatty Acids) .... Take 4 Capsules By Mouth Daily 3)  Tricor 145 Mg Tabs (Fenofibrate) .... Take One Tablet By Mouth Daily 4)  Aspirin Ec 325 Mg Tbec (Aspirin) .... Take One Tablet By Mouth Daily 5)  Lantus Solostar 100 Unit/ml Soln (Insulin Glargine) .... Inject 10 Units Subcutaneously Once Daily 6)  Pen Needles 5/16" 31g X 8 Mm Misc (Insulin Pen Needle) .... As Directed 7)  Onetouch Ultra 2 W/device Kit (Blood Glucose Monitoring Suppl) .... Use Two Times A Day To Test Blood Sugar 8)  Onetouch Ultra Blue  Strp (Glucose Blood) .... Use Two Times A Day As Directed 9)  Crestor 20 Mg Tabs (Rosuvastatin Calcium) .... One By Mouth Once Daily 10)   Ramipril 2.5 Mg Caps (Ramipril) .... One By Mouth Once Daily For Kidneys  Allergies (verified): No Known Drug Allergies  Past History:  Past Medical History: Last updated: 07/09/2010 Current Problems:  HYPERTENSION, UNSPECIFIED (ICD-401.9) HYPERTRIGLYCERIDEMIA (ICD-272.1), markedly elevated DIABETES  Family History: Reviewed history from 07/10/2010 and no changes required.   Pertinent in that his brother died at age 77 from  congestive heart failure and his father had his first myocardial  infarction at age 28 and died at age 61 of congestive heart failure. Family History Diabetes:  mother Family History Hypertension  Social History: Reviewed history from 07/10/2010 and no changes required. 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 He drinks alcohol on occasion, but not in excess. He does not smoke cigarettes. Married Regular exercise-yes  Review of Systems  The patient denies fever.         denies headache, chest pain, sob, n/v, urinary frequency, cramps, excessive diaphoresis, memory loss, depression, hypoglycemia, rhinorrhea, and easy bruising.  blurry vision is improved.   Physical Exam  General:  normal appearance.   Head:  head: no deformity eyes: no periorbital swelling, no proptosis external nose  and ears are normal mouth: no lesion seen Neck:  Supple without thyroid enlargement or tenderness.  Lungs:  Clear to auscultation bilaterally. Normal respiratory effort.  Heart:  Regular rate and rhythm without murmurs or gallops noted. Normal S1,S2.   Abdomen:  abdomen is soft, nontender.  no hepatosplenomegaly.   not distended.  no hernia  Msk:  muscle bulk and strength are grossly normal.  no obvious joint swelling.  gait is normal and steady  Pulses:  dorsalis pedis intact bilat.  no carotid bruit Extremities:  no deformity.  no ulcer on the feet.  feet are of normal color and temp.  no edema  Neurologic:   cn 2-12 grossly intact.   readily moves all 4's.   sensation is intact to touch on the feet  Skin:  normal texture and temp.  no rash.  not diaphoretic  Cervical Nodes:  No significant adenopathy.  Psych:  Alert and cooperative; normal mood and affect; normal attention span and concentration.   Additional Exam:  labs: a1c=13 (rbs was 133 jan 2011)  Fructosamine    284 umol/L   (converts to a1c of 6.4)   Impression & Recommendations:  Problem # 1:  DIABETES MELLITUS, TYPE II, ON INSULIN (ICD-250.00) abrupt onset suggests dx of type 1 dm.  this would also explain sudden improvement.  however, with time the severe hyperglycemia will recur if not rx'ed.  Problem # 2:  MICROALBUMINURIA (ICD-791.0) possibly due to #1  Problem # 3:  HYPERTENSION, UNSPECIFIED (ICD-401.9) i agree with aggressive rx of this, in view of #2  Problem # 4:  HYPERCHOLESTEROLEMIA (ICD-272.0) same as #3  Medications Added to Medication List This Visit: 1)  Lantus Solostar 100 Unit/ml Soln (Insulin glargine) .... Inject 5 units subcutaneously once daily  Other Orders: T-Fructosamine ([id number]) Consultation Level IV (43329)  Patient Instructions: 1)  good diet and exercise habits significanly improve the control of your diabetes.  please let me know if you wish to be referred to a dietician.  high blood sugar is very risky to your health.  you should see an eye doctor every year. 2)  controlling your blood pressure and cholesterol drastically reduces the damage diabetes does to your body.  this also applies to quitting smoking.  please discuss these with your doctor.  you should take an aspirin every day, unless you have been advised by a doctor not to. 3)  check your blood sugar 2 times a day.  vary the time of day when you check, between before the 3 meals, and at bedtime.  also check if you have symptoms of your blood sugar being too high or too low.  please keep a record of the readings and bring it to your  next appointment here.  please call us sooner if you are having low blood sugar episodes. 4)  blood tests are being ordered for you today.  please call [phone] to hear your test results. 5)  pending the test results, please continue the same medications for now. 6)  Please schedule a follow-up appointment in 3 months. 7)  (update: i left message on phone-tree:  reduce lantus to 5 units once daily.  call if cbg's continue to be low, so we can consider changing to actos)   Orders Added: 1)  T-Fructosamine [[id number]] 2)  Consultation Level IV [93235]

## 2010-11-05 NOTE — Letter (Signed)
Summary: Results Follow-up Letter  [facility]  [address] [place], [place] [address]   Phone: [phone]  Fax: [phone]    07/10/2010  [address] [place], [place]  [address]  Dear Mr. POPLASKI,   The following are the results of your recent test(s):  Test     Result     A1C=13     blood sugars are very high Kidney     low sodium from sugar dilution Urine       early signs of diabetic damage  _________________________________________________________  Please call for an appointment very soon _________________________________________________________ _________________________________________________________ _________________________________________________________  Sincerely,  Sanda Linger MD [facility]

## 2010-11-05 NOTE — Assessment & Plan Note (Signed)
Summary: new eval   Visit Type:  Initial Consult Primary [place] Height:  Etta Grandchild MD   History of Present Illness:             This is a 45 year old male who presents for evaluation in Lipid Clinic.  The patient presents with history of hypertriglyceridemia, hypertension, and recently diagnosed diabetes.  He has had TG in the 1000s for several years and tried many different medications including Lopid, Niaspan, Lipitor, and Tricor.  From previous notes, seems like pt had various reasons for stopping medications (niaspan-flushing, Lipitor and Tricor- myalgias) but would mainly d/c due to non-compliance.  He has a pertinent family history for dyslipidemia and CAD including a father with an MI at 51.  At his most recent visit with Dr. Excell Seltzer, pt was noticed to have TG>5000 and fasting BG of >300.  It was decided to start patient on fish oil 4gm daily, Tricor 145mg  daily and Lantus 10 units daily at that time.  He was also referred to primary care to help manage his DM.  Repeat labwork showed TG- 3470 and A1c>13%.  Primary Care MD started patient on Janumet, Crestor, and ramipril.  Of note, pt did not start these because he wanted to talk with our office first.  He has only been taking Tricor, fish oil and lantus.  He is checking his blood sugars.  His post-prandial ( ~1 hr after dinner) are around 110.  He has not done a fasting CBG.  He does report improvement in his blurred vision and is no longer shaky.      Pt has made significant dietary changes since his visit with Dr. Excell Seltzer.  Prior to October, pt would eat some type of pasta every day and pizza at least once a week.  He would eat only one large meal per day.  He would also eat ice cream every day.  His favorite drinks were sweet tea and kol-aid.  Since his visit in October, he has stopped all pasta and tea.  He now drinks water, 7up, Sprite.  He has switched to 3 smaller meals throughout the day.  Breakfast is eggs whites, Cheerios.  He eats  more salads and fresh vegetables such as brocoli and sherburt instead of ice cream.  He snacks on peanuts and almonds and has cut out all alcohol.       Exercise is on the basketball court.  Lipid Management Kenlei Safi  Weston Brass, PharmD  Medications Prior to Update: 1)  Glucosamine-Chondroitin  Caps (Glucosamine-Chondroit-Vit C-Mn) .... Once A Week 2)  Cayenne Pepper Vitamin Cap .Marland Kitchen.. 3 X A Week 3)  Fish Oil 1000 Mg Caps (Omega-3 Fatty Acids) .... Take 4 Capsules By Mouth Daily 4)  Tricor 145 Mg Tabs (Fenofibrate) .... Take One Tablet By Mouth Daily 5)  Aspirin Ec 325 Mg Tbec (Aspirin) .... Take One Tablet By Mouth Daily 6)  Lantus Solostar 100 Unit/ml Soln (Insulin Glargine) .... Inject 10 Units Subcutaneously Once Daily 7)  Pen Needles 5/16" 31g X 8 Mm Misc (Insulin Pen Needle) .... As Directed 8)  Onetouch Ultra 2 W/device Kit (Blood Glucose Monitoring Suppl) .... Use Two Times A Day To Test Blood Sugar 9)  Onetouch Ultra Blue  Strp (Glucose Blood) .... Use Two Times A Day As Directed 10)  Janumet 50-1000 Mg Tabs (Sitagliptin-Metformin Hcl) .... One By Mouth Two Times A Day For Diabetes 11)  Crestor 20 Mg Tabs (Rosuvastatin Calcium) .... One By Mouth Once Daily 12)  Ramipril 2.5 Mg Caps (Ramipril) .... One By Mouth Once Daily For Kidneys  Current Medications (verified): 1)  Cayenne Pepper Vitamin Cap .Marland Kitchen.. 3 X A Week 2)  Fish Oil 1000 Mg Caps (Omega-3 Fatty Acids) .... Take 4 Capsules By Mouth Daily 3)  Tricor 145 Mg Tabs (Fenofibrate) .... Take One Tablet By Mouth Daily 4)  Aspirin Ec 325 Mg Tbec (Aspirin) .... Take One Tablet By Mouth Daily 5)  Lantus Solostar 100 Unit/ml Soln (Insulin Glargine) .... Inject 10 Units Subcutaneously Once Daily 6)  Pen Needles 5/16" 31g X 8 Mm Misc (Insulin Pen Needle) .... As Directed 7)  Onetouch Ultra 2 W/device Kit (Blood Glucose Monitoring Suppl) .... Use Two Times A Day To Test Blood Sugar 8)  Onetouch Ultra Blue  Strp (Glucose Blood) .... Use Two  Times A Day As Directed 9)  Crestor 20 Mg Tabs (Rosuvastatin Calcium) .... One By Mouth Once Daily 10)  Ramipril 2.5 Mg Caps (Ramipril) .... One By Mouth Once Daily For Kidneys  Allergies: No Known Drug Allergies  Past History:  Past Medical History: Last updated: 07/09/2010 Current Problems:  HYPERTENSION, UNSPECIFIED (ICD-401.9) HYPERTRIGLYCERIDEMIA (ICD-272.1), markedly elevated DIABETES  Family History: Last updated: 07/10/2010   Pertinent in that his brother died at age 83 from  congestive heart failure and his father had his first myocardial  infarction at age 56 and died at age 64 of congestive heart failure. 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 He drinks alcohol on occasion, but not in excess. He does not smoke cigarettes. Married Regular exercise-yes   Vital Signs:  Patient profile:   45 year old male Height:      67 inches Weight:      201 pounds BMI:     31.59 BP sitting:   137 / 89  Impression & Recommendations:  Problem # 1:  HYPERTRIGLYCERIDEMIA (ICD-272.1) Assessment Improved Pt's cholesterol has markedly improved since starting Tricor, fish oil, and Lantus.  His TG have decreased to 282 from >5000 in 1 month.  HDL has improved to 33.8.  Since TG have decreased, LDL was able to be measured and was 124.2 (goal<70).  LFTs are WNL.  He is tolerating current medications with no issues.  Since LDL is above goal and pt is diabetic, will go ahead and start statin.  Pt has samples of Crestor 20mg  at home so will start at this dose.  Pt is comfortable continuing lifestyle changes he has already made.  Encouraged him continue exercising as much as possible.  Will f/u in 1 month.   His updated medication list for this problem includes:    Tricor 145 Mg Tabs (Fenofibrate) .Marland Kitchen... Take one tablet by  mouth daily    Crestor 20 Mg Tabs (Rosuvastatin calcium) ..... One by mouth once daily  Problem # 2:  DIABETES MELLITUS, TYPE II, ON INSULIN (ICD-250.00) Assessment: Comment Only Pt seen primary care for DM management.  Of note, did not start medications previously prescribed.  Has only been taking Lantus and self-reported CBGs are much improved.  Pt has appt scheduled with Dr. Everardo All in a few weeks.  Pt would like to wait until then to start any additional medications.  Did ask pt to begin ramipril as BP was slightly above goal of 130/80 today and is renal protective.  Pt is agreeable to this.   The  following medications were removed from the medication list:    Janumet 50-1000 Mg Tabs (Sitagliptin-metformin hcl) ..... One by mouth two times a day for diabetes His updated medication list for this problem includes:    Aspirin Ec 325 Mg Tbec (Aspirin) .Marland Kitchen... Take one tablet by mouth daily    Lantus Solostar 100 Unit/ml Soln (Insulin glargine) ..... Inject 10 units subcutaneously once daily    Ramipril 2.5 Mg Caps (Ramipril) ..... One by mouth once daily for kidneys  Patient Instructions: 1)  Your cholesterol looks great!  Continue your good work.  2)  Start Crestor 20mg  and ramipril 2.5mg  once daily 3)  Continue current medications 4)  Lab Appt: 12/12 in the am- Fasting 5)  Lipid Clinic Appt: 12/19 at 1:30pm

## 2010-11-05 NOTE — Assessment & Plan Note (Signed)
Summary: ROV   Visit Type:  Follow-up Primary Provider:  none  CC:  No complaints.  History of Present Illness: This is a 45 year-old male with familial hyperlipidemia, predomint pattern of high triglycerides and low HDL cholesterol, presenting today for follow-up evaluation. His triglycerides have been greater than 1000 without medication. He feels well and denies chest pain, dyspnea, claudication symptoms, edema, or other CV complaints. He is active but not engaged in regular exercise. He has not been compliant with lipid-lowering medication.  Current Medications (verified): 1)  Glucosamine-Chondroitin  Caps (Glucosamine-Chondroit-Vit C-Mn) .... Once A Week 2)  Cholesterol Off Tab .... Take 1 Tablet By Mouth Two Times A Day 3)  Cayenne Pepper Vitamin Cap .Marland Kitchen.. 3 X A Week  Allergies (verified): No Known Drug Allergies  Past History:  Past medical history reviewed for relevance to current acute and chronic problems.  Past Medical History: Current Problems:  HYPERTENSION, UNSPECIFIED (ICD-401.9) HYPERTRIGLYCERIDEMIA (ICD-272.1), markedly elevated DIABETES  Review of Systems       Negative except as per HPI   Vital Signs:  Patient profile:   45 year old male Height:      67 inches Weight:      203.50 pounds BMI:     31.99 Pulse rate:   63 / minute Pulse rhythm:   regular Resp:     18 per minute BP sitting:   128 / 90  (left arm) Cuff size:   large  Vitals Entered By: Vikki Ports (July 09, 2010 11:52 AM)  Physical Exam  General:  Pt is alert and oriented, obese, African-American male, in no acute distress. HEENT: normal Neck: normal carotid upstrokes without bruits, JVP normal Lungs: CTA CV: RRR without murmur or gallop Abd: soft, NT, positive BS, no bruit, no organomegaly Ext: no clubbing, cyanosis, or edema. peripheral pulses 2+ and equal Skin: warm and dry without rash    EKG  Procedure date:  07/09/2010  Findings:      NSR 64 bpm, left axis  deviation, otherwise within normal limits.  Impression & Recommendations:  Problem # 1:  HYPERTRIGLYCERIDEMIA (ICD-272.1) Lipids from 07/07/10 reviewed.Marland KitchenMarland KitchenTotal chol 817, Trig 5655, HDL <9 !!!  We had a long discussion again today about the serious nature of the patient's dyslipidemia. I tried to impress upon him the risk of pancreatitis and cardiovascular events with severe untreated dyslipidemia and triglycerides greater than 5000. He has developed Type 2 diabetes with fasting glucose greater than 300, and this is clearly contributing to worsening of his lipid abnormalities.   Plan: Initiate tricor, initiate Lantus insulin, ASA 81 mg daily, primary care referral for diabetes treatment, nutrition referral for education. The patient will be referred to our lipid clinic as well.   CHOL: 287 (10/29/2009)   LDL: DEL (06/27/2008)   HDL: 41.40 (10/29/2009)   TG: 1624.0 Lipemic mg/dL (34/74/2595)  His updated medication list for this problem includes:    Tricor 145 Mg Tabs (Fenofibrate) .Marland Kitchen... Take one tablet by mouth daily  Orders: Lipid Clinic (Lipid Clinic) Primary Care Referral (Primary) Nutrition Referral (Nutrition)  Problem # 2:  HYPERTENSION, UNSPECIFIED (ICD-401.9) Assessment: Unchanged  The following medications were removed from the medication list:    Aspirin 81 Mg Tbec (Aspirin) .Marland Kitchen... Take one tablet by mouth daily His updated medication list for this problem includes:    Aspirin Ec 325 Mg Tbec (Aspirin) .Marland Kitchen... Take one tablet by mouth daily  Orders: Lipid Clinic (Lipid Clinic) Primary Care Referral (Primary) Nutrition Referral (Nutrition)  BP  today: 128/90 Prior BP: 134/90 (10/29/2009)  Labs Reviewed: K+: 4.1 (07/07/2010) Creat: : 0.8 (07/07/2010)   Chol: 287 (10/29/2009)   HDL: 41.40 (10/29/2009)   LDL: DEL (06/27/2008)   TG: 1624.0 Lipemic mg/dL (16/07/9603)  Patient Instructions: 1)  Your physician recommends that you return for a FASTING LIPID and LIVER Profile in 1  Month (prior to lipid clinic appt--272.0) 2)  Your physician has recommended you make the following change in your medication: START Tricor 145mg  one daily, START Fish Oil 4 Grams daily, START Aspirin 325mg  once a day, START Lantus 10 units daily 3)  Your physician wants you to follow-up in: 6 MONTHS.  You will receive a reminder letter in the mail two months in advance. If you don't receive a letter, please call our office to schedule the follow-up appointment. 4)  An appointment for you to see the Dietician has been scheduled. Please keep your appointment or be sure to call if you need to reschedule. 5)  An appointment for you to see the Lipid Clinic has been scheduled. Please keep your appointment or be sure to call if you need to reschedule. 6)  You have been referred to [facility] at [facility].  Dr Sanda Linger 07/10/10 at 2:45.  Please arrive at 2:30 for check-in. Prescriptions: TRICOR 145 MG TABS (FENOFIBRATE) take one tablet by mouth daily  #30 x 3   Entered by:   Julieta Gutting, RN, BSN   Authorized by:   Norva Karvonen, MD   Signed by:   Julieta Gutting, RN, BSN on 07/09/2010   Method used:   Electronically to        [id number] (retail)       [address] [place], [place]  [address]       Ph: [phone]       Fax: [phone]   RxID:   [id number] PEN NEEDLES 5/16" 31G X 8 MM MISC (INSULIN PEN NEEDLE) as directed  #100 x 0   Entered by:   Julieta Gutting, RN, BSN   Authorized by:   Norva Karvonen, MD   Signed by:   Julieta Gutting, RN, BSN on 07/09/2010   Method used:   Electronically to        [facility] [id number]* (retail)       [address] [place], [place]  [address]       Ph: [phone]       Fax: [phone]   RxID:   [id number] LANTUS SOLOSTAR 100 UNIT/ML SOLN (INSULIN GLARGINE) inject 10 Units subcutaneously once daily  #15 x 0   Entered by:   Julieta Gutting, RN, BSN   Authorized by:   Norva Karvonen, MD   Signed  by:   Julieta Gutting, RN, BSN on 07/09/2010   Method used:   Electronically to        [facility] [address] [facility] [address] [id number]* (retail)       [address] [place], [place]  [address]       Ph: [phone]       Fax: [phone]   RxID:   [id number]

## 2010-11-05 NOTE — Assessment & Plan Note (Signed)
Summary: NEW/ FED BCBS/ DM/ PER CARDIOLOGY/NWS   Vital Signs:  Patient profile:   45 year old male Height:      67 inches Weight:      206 pounds BMI:     32.38 O2 Sat:      97 % on Room air Temp:     98.8 degrees F oral Pulse rate:   84 / minute Pulse rhythm:   regular Resp:     16 per minute BP sitting:   120 / 82  (left arm) Cuff size:   small  Vitals Entered By: Rock Nephew CMA (July 10, 2010 2:49 PM)  Nutrition Counseling: Patient's BMI is greater than 25 and therefore counseled on weight management options.  O2 Flow:  Room air CC: New patient to establish, Hypertension Management Is Patient Diabetic? Yes Did you bring your meter with you today? No Pain Assessment Patient in pain? no       Does patient need assistance? Functional Status Self care Ambulation Normal   Primary Care Provider:  Etta Grandchild MD  CC:  New patient to establish and Hypertension Management.  History of Present Illness: New to me for evaluation of Diabetes, he had a random blood sugar done yesterday that was nearly 400 and was started on Lantus by a PharmD.  Hypertension History:      He denies chest pain, palpitations, dyspnea with exertion, peripheral edema, visual symptoms, neurologic problems, and syncope.        Positive major cardiovascular risk factors include diabetes, hyperlipidemia, and hypertension.  Negative major cardiovascular risk factors include male age less than 6 years old, negative family history for ischemic heart disease, and non-tobacco-user status.        Further assessment for target organ damage reveals no history of ASHD, cardiac end-organ damage (CHF/LVH), stroke/TIA, peripheral vascular disease, renal insufficiency, or hypertensive retinopathy.     Preventive Screening-Counseling & Management  Alcohol-Tobacco     Smoking Status: never  Caffeine-Diet-Exercise     Does Patient Exercise: yes  Current Medications (verified): 1)   Glucosamine-Chondroitin  Caps (Glucosamine-Chondroit-Vit C-Mn) .... Once A Week 2)  Cayenne Pepper Vitamin Cap .Marland Kitchen.. 3 X A Week 3)  Fish Oil 1000 Mg Caps (Omega-3 Fatty Acids) .... Take 4 Capsules By Mouth Daily 4)  Tricor 145 Mg Tabs (Fenofibrate) .... Take One Tablet By Mouth Daily 5)  Aspirin Ec 325 Mg Tbec (Aspirin) .... Take One Tablet By Mouth Daily 6)  Lantus Solostar 100 Unit/ml Soln (Insulin Glargine) .... Inject 10 Units Subcutaneously Once Daily 7)  Pen Needles 5/16" 31g X 8 Mm Misc (Insulin Pen Needle) .... As Directed  Allergies (verified): No Known Drug Allergies  Past History:  Past Medical History: Last updated: 10/16/2009 Current Problems:  HYPERTENSION, UNSPECIFIED (ICD-401.9) HYPERTRIGLYCERIDEMIA (ICD-272.1) PREDIABETES (ICD-790.29)  Past Surgical History: Last updated: 10/16/2009 None  Family History: Last updated: 07/10/2010   Pertinent in that his brother died at age 59 from  congestive heart failure and his father had his first myocardial  infarction at age 22 and died at age 13 of congestive heart failure. 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 He drinks alcohol on occasion, but not in excess. He does not smoke cigarettes. Married Regular exercise-yes  Risk Factors: Exercise: yes (07/10/2010)  Risk Factors: Smoking Status: never (07/10/2010)  Family History:   Pertinent  in that his brother died at age 61 from  congestive heart failure and his father had his first myocardial  infarction at age 34 and died at age 69 of congestive heart failure. Family History Diabetes 1st degree relative Family History Hypertension  Social History: Reviewed history from 10/16/2009 and no changes required. 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 He drinks alcohol on occasion, but not in excess. He does not smoke cigarettes. Married Regular exercise-yes Smoking Status:  never Does Patient Exercise:  yes  Review of Systems       The patient complains of weight gain.  The patient denies anorexia, fever, weight loss, chest pain, syncope, dyspnea on exertion, peripheral edema, prolonged cough, headaches, hemoptysis, abdominal pain, suspicious skin lesions, difficulty walking, and depression.   Endo:  Complains of excessive hunger, excessive thirst, excessive urination, polyuria, and weight change; denies cold intolerance and heat intolerance.  Physical Exam  General:  alert, well-developed, well-nourished, well-hydrated, cooperative to examination, good hygiene, and overweight-appearing.   Head:  normocephalic, atraumatic, no abnormalities observed, and no abnormalities palpated.   Eyes:  No corneal or conjunctival inflammation noted. EOMI. Perrla. Funduscopic exam benign, without hemorrhages, exudates or papilledema. Vision grossly normal. Mouth:  Oral mucosa and oropharynx without lesions or exudates.  Teeth in good repair. Neck:  No deformities, masses, or tenderness noted. Lungs:  normal respiratory effort, no intercostal retractions, no accessory muscle use, normal breath sounds, no dullness, no fremitus, no crackles, and no wheezes.   Heart:  Normal rate and regular rhythm. S1 and S2 normal without gallop, murmur, click, rub or other extra sounds. Abdomen:  soft, non-tender, normal bowel sounds, no distention, no masses, no guarding, no rigidity, no rebound tenderness, no abdominal hernia, no inguinal hernia, no hepatomegaly, and no splenomegaly.   Msk:  No deformity or scoliosis noted of thoracic or lumbar spine.   Pulses:  R and L carotid,radial,femoral,dorsalis pedis and posterior tibial pulses are full and equal bilaterally Extremities:  No clubbing, cyanosis, edema, or deformity noted with normal full range of motion  of all joints.   Neurologic:  No cranial nerve deficits noted. Station and gait are normal. Plantar reflexes are down-going bilaterally. DTRs are symmetrical throughout. Sensory, motor and coordinative functions appear intact. Skin:  Intact without suspicious lesions or rashes Cervical Nodes:  No lymphadenopathy noted Axillary Nodes:  No palpable lymphadenopathy Inguinal Nodes:  No significant adenopathy Psych:  Cognition and judgment appear intact. Alert and cooperative with normal attention span and concentration. No apparent delusions, illusions, hallucinations  Diabetes Management Exam:    Foot Exam (with socks and/or shoes not present):       Sensory-Pinprick/Light touch:          Left medial foot (L-4): normal          Left dorsal foot (L-5): normal          Left lateral foot (S-1): normal          Right medial foot (L-4): normal          Right dorsal foot (L-5): normal          Right lateral foot (S-1): normal       Sensory-Monofilament:          Left foot: normal          Right foot: normal       Inspection:          Left foot: normal  Right foot: normal       Nails:          Left foot: normal          Right foot: normal   Impression & Recommendations:  Problem # 1:  DIABETES MELLITUS, TYPE II, ON INSULIN (ICD-250.00) Assessment New  He will probably need oral meds with metformin as the cornerstone of therapy for DM II His updated medication list for this problem includes:    Aspirin Ec 325 Mg Tbec (Aspirin) .Marland Kitchen... Take one tablet by mouth daily    Lantus Solostar 100 Unit/ml Soln (Insulin glargine) ..... Inject 10 units subcutaneously once daily  Orders: Diabetic Clinic Referral (Diabetic) Nutrition Referral (Nutrition) Venipuncture (24401) TLB-Lipid Panel (80061-LIPID) TLB-BMP (Basic Metabolic Panel-BMET) (80048-METABOL) TLB-CBC Platelet - w/Differential (85025-CBCD) TLB-Hepatic/Liver Function Pnl (80076-HEPATIC) TLB-A1C / Hgb A1C (Glycohemoglobin)  (83036-A1C) TLB-Udip w/ Micro (81001-URINE) TLB-Microalbumin/Creat Ratio, Urine (82043-MALB) Ophthalmology Referral (Ophthalmology)  Labs Reviewed: Creat: 0.8 (07/07/2010)     Problem # 2:  HYPERTRIGLYCERIDEMIA (ICD-272.1) Assessment: Unchanged  His updated medication list for this problem includes:    Tricor 145 Mg Tabs (Fenofibrate) .Marland Kitchen... Take one tablet by mouth daily  Orders: Diabetic Clinic Referral (Diabetic) Nutrition Referral (Nutrition) Venipuncture (02725) TLB-Lipid Panel (80061-LIPID) TLB-BMP (Basic Metabolic Panel-BMET) (80048-METABOL) TLB-CBC Platelet - w/Differential (85025-CBCD) TLB-Hepatic/Liver Function Pnl (80076-HEPATIC) TLB-A1C / Hgb A1C (Glycohemoglobin) (83036-A1C) TLB-Udip w/ Micro (81001-URINE) TLB-Microalbumin/Creat Ratio, Urine (82043-MALB)  Problem # 3:  HYPERTENSION, UNSPECIFIED (ICD-401.9) Assessment: Improved  Orders: Diabetic Clinic Referral (Diabetic) Nutrition Referral (Nutrition) Venipuncture (36644) TLB-Lipid Panel (80061-LIPID) TLB-BMP (Basic Metabolic Panel-BMET) (80048-METABOL) TLB-CBC Platelet - w/Differential (85025-CBCD) TLB-Hepatic/Liver Function Pnl (80076-HEPATIC) TLB-A1C / Hgb A1C (Glycohemoglobin) (83036-A1C) TLB-Udip w/ Micro (81001-URINE) TLB-Microalbumin/Creat Ratio, Urine (82043-MALB)  BP today: 120/82 Prior BP: 134/90 (10/29/2009)  10 Yr Risk Heart Disease: Not enough information  Labs Reviewed: K+: 4.1 (07/07/2010) Creat: : 0.8 (07/07/2010)   Chol: 287 (10/29/2009)   HDL: 41.40 (10/29/2009)   LDL: DEL (06/27/2008)   TG: 1624.0 Lipemic mg/dL (03/47/4259)  Complete Medication List: 1)  Glucosamine-chondroitin Caps (Glucosamine-chondroit-vit c-mn) .... Once a week 2)  Cayenne Pepper Vitamin Cap  .Marland Kitchen.. 3 x a week 3)  Fish Oil 1000 Mg Caps (Omega-3 fatty acids) .... Take 4 capsules by mouth daily 4)  Tricor 145 Mg Tabs (Fenofibrate) .... Take one tablet by mouth daily 5)  Aspirin Ec 325 Mg Tbec (Aspirin) .... Take  one tablet by mouth daily 6)  Lantus Solostar 100 Unit/ml Soln (Insulin glargine) .... Inject 10 units subcutaneously once daily 7)  Pen Needles 5/16" 31g X 8 Mm Misc (Insulin pen needle) .... As directed 8)  Onetouch Ultra 2 W/device Kit (Blood glucose monitoring suppl) .... Use two times a day to test blood sugar 9)  Onetouch Ultra Blue Strp (Glucose blood) .... Use two times a day as directed  Other Orders: Tdap => 73yrs IM (56387) Admin 1st Vaccine (56433) Admin 1st Vaccine (29518) Flu Vaccine 68yrs + (84166)  Hypertension Assessment/Plan:      The patient's hypertensive risk group is category C: Target organ damage and/or diabetes.  Today's blood pressure is 120/82.    Patient Instructions: 1)  Please schedule a follow-up appointment in 2 weeks. 2)  It is important that you exercise regularly at least 20 minutes 5 times a week. If you develop chest pain, have severe difficulty breathing, or feel very tired , stop exercising immediately and seek medical attention. 3)  You need to lose weight. Consider a lower calorie diet and regular exercise.  4)  Check your blood sugars regularly. If your readings are usually above 200 or below 70 you should contact our office. 5)  It is important that your Diabetic A1c level is checked every 3 months. 6)  See your eye doctor yearly to check for diabetic eye damage. 7)  Check your feet each night for sore areas, calluses or signs of infection. 8)  Check your Blood Pressure regularly. If it is above: you should make an appointment. Prescriptions: ONETOUCH ULTRA BLUE  STRP (GLUCOSE BLOOD) Use two times a day as directed  #10 x 0   Entered and Authorized by:   Etta Grandchild MD   Signed by:   Etta Grandchild MD on 07/10/2010   Method used:   Samples Given   RxID:   [id number] ONETOUCH ULTRA 2 W/DEVICE KIT (BLOOD GLUCOSE MONITORING SUPPL) Use two times a day to test blood sugar  #1 x 0   Entered and Authorized by:   Etta Grandchild MD   Signed  by:   Etta Grandchild MD on 07/10/2010   Method used:   Samples Given   RxID:   [id number]    Immunizations Administered:  Tetanus Vaccine:    Vaccine Type: Tdap    Site: right deltoid    Mfr: GlaxoSmithKline    Dose: 0.5 ml    Route: IM    Given by: Lamar Sprinkles, CMA    Exp. Date: 07/23/2012    Lot #: [id number]    VIS given: 08/21/08 version given July 10, 2010.      Flu Vaccine Consent Questions     Do you have a history of severe allergic reactions to this vaccine? no    Any prior history of allergic reactions to egg and/or gelatin? no    Do you have a sensitivity to the preservative Thimersol? no    Do you have a past history of Guillan-Barre Syndrome? no    Do you currently have an acute febrile illness? no    Have you ever had a severe reaction to latex? no    Vaccine information given and explained to patient? yes    Are you currently pregnant? no    Lot Number:[id number]   Exp Date:04/03/2011   Site Given  Left Deltoid IMlu ... Lamar Sprinkles, CMA  July 10, 2010 3:56 PM   Appended Document: Orders Update    Clinical Lists Changes  Orders: Added new Test order of T- * Misc. Laboratory test [phone]) - Signed

## 2010-12-08 ENCOUNTER — Ambulatory Visit: Payer: Self-pay | Admitting: Endocrinology

## 2010-12-08 DIAGNOSIS — Z0289 Encounter for other administrative examinations: Secondary | ICD-10-CM

## 2010-12-15 NOTE — Letter (Signed)
Summary: [facility]   Imported By: Marylou Mccoy 12/10/2010 15:26:10  _____________________________________________________________________  External Attachment:    Type:   Image     Comment:   External Document

## 2010-12-21 ENCOUNTER — Other Ambulatory Visit: Payer: Self-pay

## 2010-12-24 ENCOUNTER — Ambulatory Visit: Payer: Self-pay

## 2011-02-16 NOTE — Procedures (Signed)
[facility]                              EXERCISE TREADMILL   NAME:Courville, MALIK PAAR                       MRN:          [id number]  DATE:03/06/2007                            DOB:          1966/08/15    PROCEDURE:  Exercise treadmill stress test.   INDICATIONS:  Mr. Mecham is a 45 year old African-American male with  multiple cardiac risk factors.  He was referred for an exercise  treadmill stress test in the setting of severe familial hyperlipidemia.   The patient has a normal resting ECG with the exception of left axis  deviation.  He has no ST segment or T wave changes.  He exercised for 7  minutes and 12 seconds according to the Bruce protocol for 8.8 metabolic  equivalents.  He had a resting heart rate of 75 beats per minute that  rose to 162 beats per minute which represents 90% of the maximal age  predicted heart rate.  There was no chest pain, arrhythmia or  significant ST segment change with exercise.  His resting blood pressure  rose from 132 to 182 to a maximum of 193/96.   INTERPRETATION:  1. Negative for ischemia.  2. Hypertensive response to exercise.     Veverly Fells. Excell Seltzer, MD  Electronically Signed    MDC/MedQ  DD: 03/06/2007  DT: 03/06/2007  Job #: [id number]

## 2011-02-16 NOTE — Assessment & Plan Note (Signed)
[facility]                            CARDIOLOGY OFFICE NOTE   NAME:Wurth, UNDREA SHIPES                       MRN:          [id number]  DATE:06/24/2008                            DOB:          1966-01-31    REASON FOR VISIT:  Dyslipidemia.   HISTORY OF PRESENT ILLNESS:  Mr. Betsch is a 45 year old gentleman  with familial dyslipidemia mainly by hypertriglyceridemia.  He is  asymptomatic from a cardiac standpoint.  Unfortunately, he has not been  following a diet and has been off all of his medications.  He has had  difficulty tolerating both Lipitor and TriCor.  He had myalgias with  both of those medications.  He also has been off Lopid because of  intolerance and he discontinued Niaspan because he wanted to see if he  could control his triglycerides with diet and exercise.  He had flushing  with Niaspan, but this was fairly mild.  He was able to achieve a dose  of 1000 mg twice daily on this drug.   He has developed problems with heartburn.  He has symptoms at night time  and may always occur after eating.  He has been eating a lot of fried  foods and the symptoms are worse with those type of foods.  He denies  exertional dyspnea, edema, exertional chest pain, or other complaints.   MEDICATIONS:  None.   ALLERGIES:  NKDA.   PHYSICAL EXAMINATION:  VITAL SIGNS:  Weight is 221 pounds.  Blood  pressure 144/100, heart rate is 84, and respiratory rate is 12.  HEENT:  Normal.  NECK:  Normal carotid upstrokes.  No bruits.  JVP normal.  LUNGS:  Clear bilaterally.  HEART:  Regular rate and rhythm.  No murmurs or gallops.  ABDOMEN:  Soft and nontender.  No organomegaly.  EXTREMITIES:  No clubbing, cyanosis, or edema.   LABORATORY DATA:  Lipid panel from June 2008 showed normal LFTs with a  cholesterol of 248, triglycerides of 939, HDL 27, and LDL direct was 67.   EKG shows normal sinus rhythm with leftward axis, but otherwise within  normal  limits.   ASSESSMENT:  1. Hypertriglyceridemia.  Lipids has not been checked in over 1 year.      We will repeat lipids and LFTs.  I suspect his triglycerides will      be even higher since he has gained 10 pounds since last year.  We      really need to find a medication that he can stick with.  We will      probably retry Niaspan since he has tolerated this reasonably well      in the past.  Diet and weight loss are going to be a major factor      for his long-term control as well.  2. Hypertension.  The blood pressure was elevated today.  It was      normal at the time of his last visit.  Again, this will respond a      risk reduction through diet and behavioral change.  Long discussion  today regarding these measures.  3. Prediabetes.  Fasting blood sugar was elevated last year with a      glucose of 113.  We will follow up with labs again.   DISCUSSION:  Same as above.   For followup, I will see Mr. Champney back in 4 months.  I will be in  touch with further plans for his management after his blood work is  back.     Veverly Fells. Excell Seltzer, MD  Electronically Signed    MDC/MedQ  DD: 06/24/2008  DT: 06/25/2008  Job #: [id number]

## 2011-02-19 NOTE — Assessment & Plan Note (Signed)
[facility]                            CARDIOLOGY OFFICE NOTE   NAME:Lycan, SIONE BAUMGARTEN                       MRN:          [id number]  DATE:11/23/2006                            DOB:          Sep 25, 1966    Janeann Forehand returns to the [facility] in outpatient  follow up for his dyslipidemia. He has been previously followed by [facility] Must, but has not been seen here since 2003. He has had severe  hypertriglyceridemia and a family history of premature coronary artery  disease. He has previously been treated on Niaspan 1500 mg at night and  was tolerating this medication reasonably well. However, he discontinued  this back in 2003 and has not been on any medications since. He reports  that his diet has changed dramatically since that time and that he  follows a much healthier diet program. He does not exercise regularly.  Mr. Schetter has not had his lipids checked or seen a physician in the  last 4 years.   At today's visit, he has no complaints. He specifically denies chest  pain, dyspnea, orthopnea, PND, palpitations, light headedness, or  syncope. His only complaint is related to gastroesophageal reflux  disease. He gets a burning in his throat after eating certain foods.   MEDICATIONS:  None.   ALLERGIES:  No known drug allergies.   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 He drinks alcohol on occasion,  but not in excess. He does not smoke cigarettes.   FAMILY HISTORY:  Pertinent in that his brother died at age 70 from  congestive heart failure and his father had his first myocardial  infarction at age 43 and died at age 55 of congestive heart failure.   REVIEW OF SYSTEMS:  A complete 12 point review of systems was performed.  There were no pertinent positives to report.   PHYSICAL EXAMINATION:  GENERAL: The patient is alert and oriented. He is  in no  acute distress.  VITAL SIGNS: Blood pressure 110/80, heart rate 69, respiratory rate 12,  weight 210 pounds.  HEENT: Normal.  NECK: Normal. Carotid upstrokes are without bruits. Jugular venous  pressure is normal.  LUNGS: Clear to auscultation bilaterally.  HEART: Regular rate and rhythm without murmurs or gallops.  ABDOMEN: Soft and nontender, no organomegaly. No abdominal bruits.  EXTREMITIES: No cyanosis, clubbing, or edema. Peripheral pulses are 2+  and equal throughout.  SKIN: Warm and dry without rash. There are no skin lesions or  xanthelasmas.  LYMPHATICS: There is no adenopathy.  NEUROLOGIC: Cranial nerves 2-12 are intact. Motor strength is 5/5 and  equal in the arms and legs bilaterally.   EKG demonstrates normal sinus rhythm with left axis deviation.  Otherwise, it is within normal limits.   ASSESSMENT:  Mr. Jenniges is a 45 year old gentleman with a history of  marked hypertriglyceridemia. His last lipid panel, which was performed  in February 2003, demonstrated a triglyceride level of 298 on therapy.  His previous triglyceride level had been greater than 1300. He  clearly  needs a repeat lipid panel drawn. Following the results of his blood  work, we will likely initiate therapy with either Lopid or Niaspan. I  will plan on seeing him back in two weeks after his lipids and LFTs are  completed. Also, we will check a basic metabolic panel and a CBC as he  has not had any blood work performed in several years.   Regarding his strong family history of coronary artery disease, he may  require a stress test in the future. We will review this with him at the  time of his return visit.     Veverly Fells. Excell Seltzer, MD  Electronically Signed    MDC/MedQ  DD: 11/23/2006  DT: 11/24/2006  Job #: [id number]

## 2011-02-19 NOTE — Assessment & Plan Note (Signed)
[facility]                            CARDIOLOGY OFFICE NOTE   NAME:Zeitz, LATHYN GRIGGS                       MRN:          [id number]  DATE:12/07/2006                            DOB:          1966-08-06    Janeann Forehand returns for followup as an outpatient at the [facility] on December 07, 2006.  He is a 45 year old man with  familial dyslipidemia presenting today after an initial evaluation on  February 20.  He is asymptomatic from a cardiac standpoint.  His lipid  panel demonstrated a total cholesterol of 234 with an HDL of 31, LDL of  80, and a triglyceride level of 856.  His fasting glucose was 113.   Current medicines include only omega 3, multivitamin, garlic tablet, and  an over-the-counter medicine called Cholest-Off.   ALLERGIES:  No known drug allergies.   PHYSICAL EXAMINATION:  GENERAL:  The patient is alert and oriented.  He  is in no acute distress.  VITAL SIGNS:  Weight is 211 pounds, blood pressure is 138/83, heart rate  75, respiratory rate 12.  HEENT:  Normal.  NECK:  Normal carotid upstrokes without bruits.  Jugular venous pressure  is normal.  LUNGS:  Clear to auscultation bilaterally.  HEART:  Regular rate and rhythm without murmurs or gallops.  ABDOMEN:  Soft, nontender.  EXTREMITIES:  No clubbing, cyanosis, or edema.   ASSESSMENT:  Mr. Lamp is currently stable from a cardiac standpoint.  His problems are as follows:  1. Familial dyslipidemia with markedly elevated triglycerides and low      HDL cholesterol.  I have asked him to start on Lopid 600 mg twice      daily.  We will plan on recheck of lipids and LFTs in 12 weeks.  Of      note, he has been on Niaspan in the remote past but had trouble      getting up to a therapeutic dose.  If he is intolerant to Lopid, we      could give Niaspan another try, but I think that Lopid is a good      starting point with his specific lipid profile.  2. Prediabetes.  His  fasting blood glucose was 113.  We had a long      discussion today about dietary modifications and the importance of      limiting simple sugars and carbohydrates.  Also talked about the      importance of aerobic exercise.  We will plan on repeating his      fasting blood glucose in 3 months when he has his lipids drawn.  3. Overall cardiovascular risks.  Mr. Swing clearly has increased      risk with his dyslipidemia and family history of premature coronary      artery disease.  I would like to perform an exercise treadmill      stress test prior to his return visit in 3 months.  We will arrange      for this.   For followup, again, I will see him after his  lipids, LFTs, glucose and  exercise treadmill test.     Veverly Fells. Excell Seltzer, MD  Electronically Signed    MDC/MedQ  DD: 12/07/2006  DT: 12/07/2006  Job #: [id number]

## 2011-04-20 ENCOUNTER — Other Ambulatory Visit (INDEPENDENT_AMBULATORY_CARE_PROVIDER_SITE_OTHER): Payer: Federal, State, Local not specified - PPO

## 2011-04-20 ENCOUNTER — Encounter: Payer: Self-pay | Admitting: Internal Medicine

## 2011-04-20 ENCOUNTER — Ambulatory Visit (INDEPENDENT_AMBULATORY_CARE_PROVIDER_SITE_OTHER): Payer: Federal, State, Local not specified - PPO | Admitting: Internal Medicine

## 2011-04-20 ENCOUNTER — Telehealth: Payer: Self-pay | Admitting: *Deleted

## 2011-04-20 ENCOUNTER — Telehealth: Payer: Self-pay | Admitting: Internal Medicine

## 2011-04-20 DIAGNOSIS — E119 Type 2 diabetes mellitus without complications: Secondary | ICD-10-CM

## 2011-04-20 DIAGNOSIS — M25571 Pain in right ankle and joints of right foot: Secondary | ICD-10-CM

## 2011-04-20 DIAGNOSIS — M109 Gout, unspecified: Secondary | ICD-10-CM | POA: Insufficient documentation

## 2011-04-20 DIAGNOSIS — M25579 Pain in unspecified ankle and joints of unspecified foot: Secondary | ICD-10-CM

## 2011-04-20 LAB — COMPREHENSIVE METABOLIC PANEL
Alkaline Phosphatase: 36 U/L — ABNORMAL LOW (ref 39–117)
BUN: 11 mg/dL (ref 6–23)
CO2: 30 mEq/L (ref 19–32)
GFR: 121.86 mL/min (ref >60.00)
Glucose, Bld: 169 mg/dL — ABNORMAL HIGH (ref 70–99)
Sodium: 139 mEq/L (ref 135–145)
Total Bilirubin: 0.7 mg/dL (ref 0.3–1.2)
Total Protein: 8.5 g/dL — ABNORMAL HIGH (ref 6.0–8.3)

## 2011-04-20 LAB — HEMOGLOBIN A1C: Hgb A1c MFr Bld: 8.7 % — ABNORMAL HIGH (ref 4.6–6.5)

## 2011-04-20 MED ORDER — INDOMETHACIN ER 75 MG PO CPCR: 75.0000 mg | ORAL_CAPSULE | Freq: Two times a day (BID) | ORAL | Status: DC

## 2011-04-20 MED ORDER — TRAMADOL HCL 50 MG PO TABS: 50.0000 mg | ORAL_TABLET | Freq: Two times a day (BID) | ORAL | Status: DC | PRN

## 2011-04-20 NOTE — Telephone Encounter (Signed)
Called pt he is seeing Dr Macario Golds today for evaluation of pain in his foot

## 2011-04-20 NOTE — Telephone Encounter (Signed)
Tim Wilson, please, inform patient that all labs are normal except for diabetic test. Gout test was nl. It could be pseudogout. Take meds as prescribed. Keep ROV. Thx

## 2011-04-20 NOTE — Progress Notes (Signed)
  Subjective:    Patient ID: Tim Wilson, male    DOB: 1966/03/27, 45 y.o.   MRN: [id number]  HPI   C/o severe R ankle pain x 4 d - he had several attacks over past year, pain is 10/10 now. Aleve helped  A little   Review of Systems  Constitutional: Negative for appetite change, fatigue and unexpected weight change.  HENT: Negative for nosebleeds, congestion, sore throat, sneezing, trouble swallowing and neck pain.   Eyes: Negative for itching and visual disturbance.  Respiratory: Negative for cough.   Cardiovascular: Negative for chest pain, palpitations and leg swelling.  Gastrointestinal: Negative for nausea, diarrhea, blood in stool and abdominal distention.  Genitourinary: Negative for frequency and hematuria.  Musculoskeletal: Positive for arthralgias (R ankle). Negative for back pain, joint swelling and gait problem.  Skin: Negative for rash.  Neurological: Negative for dizziness, tremors, speech difficulty and weakness.  Psychiatric/Behavioral: Negative for sleep disturbance, dysphoric mood and agitation. The patient is not nervous/anxious.        Objective:   Physical Exam  Constitutional: He is oriented to person, place, and time. He appears well-developed.  HENT:  Mouth/Throat: Oropharynx is clear and moist.  Eyes: Conjunctivae are normal. Pupils are equal, round, and reactive to light.  Neck: Normal range of motion. No JVD present. No thyromegaly present.  Cardiovascular: Normal rate, regular rhythm, normal heart sounds and intact distal pulses.  Exam reveals no gallop and no friction rub.   No murmur heard. Pulmonary/Chest: Effort normal and breath sounds normal. No respiratory distress. He has no wheezes. He has no rales. He exhibits no tenderness.  Abdominal: Soft. Bowel sounds are normal. He exhibits no distension and no mass. There is no tenderness. There is no rebound and no guarding.  Musculoskeletal: Normal range of motion. He exhibits edema (R ankle) and  tenderness.  Lymphadenopathy:    He has no cervical adenopathy.  Neurological: He is alert and oriented to person, place, and time. He has normal reflexes. No cranial nerve deficit. He exhibits normal muscle tone. Coordination normal.  Skin: Skin is warm and dry. No rash noted.  Psychiatric: He has a normal mood and affect. His behavior is normal. Judgment and thought content normal.          Assessment & Plan:    Gout Info printed

## 2011-04-20 NOTE — Telephone Encounter (Signed)
Pt called stating he needs a ref to a podiatrist . Please Advise

## 2011-05-05 ENCOUNTER — Encounter: Payer: Self-pay | Admitting: Internal Medicine

## 2011-05-05 ENCOUNTER — Ambulatory Visit (INDEPENDENT_AMBULATORY_CARE_PROVIDER_SITE_OTHER): Payer: Federal, State, Local not specified - PPO | Admitting: Internal Medicine

## 2011-05-05 VITALS — BP 140/90 | HR 69 | Temp 98.7°F | Resp 16 | Wt 214.0 lb

## 2011-05-05 DIAGNOSIS — E781 Pure hyperglyceridemia: Secondary | ICD-10-CM

## 2011-05-05 DIAGNOSIS — E7800 Pure hypercholesterolemia, unspecified: Secondary | ICD-10-CM

## 2011-05-05 DIAGNOSIS — R809 Proteinuria, unspecified: Secondary | ICD-10-CM

## 2011-05-05 DIAGNOSIS — I10 Essential (primary) hypertension: Secondary | ICD-10-CM

## 2011-05-05 DIAGNOSIS — E119 Type 2 diabetes mellitus without complications: Secondary | ICD-10-CM

## 2011-05-05 MED ORDER — GLUCOSE BLOOD VI STRP: ORAL_STRIP | Status: DC

## 2011-05-05 MED ORDER — INSULIN GLARGINE 100 UNIT/ML ~~LOC~~ SOLN: 20.0000 [IU] | Freq: Every day | SUBCUTANEOUS | Status: DC

## 2011-05-05 MED ORDER — "PEN NEEDLES 5/16"" 31G X 8 MM MISC": 1.0000 | Freq: Every day | Status: DC

## 2011-05-05 NOTE — Progress Notes (Signed)
Subjective:    Patient ID: Tim Wilson, male    DOB: 06-22-1966, 45 y.o.   MRN: [id number]  Diabetes He presents for his follow-up diabetic visit. He has type 2 diabetes mellitus. His disease course has been worsening. There are no hypoglycemic associated symptoms. Pertinent negatives for hypoglycemia include no dizziness, headaches, pallor, seizures, speech difficulty, sweats or tremors. There are no diabetic associated symptoms. Pertinent negatives for diabetes include no blurred vision, no chest pain, no fatigue and no weakness. There are no hypoglycemic complications. Symptoms are stable. There are no diabetic complications. When asked about current treatments, none were reported. He is compliant with treatment none of the time. His weight is increasing steadily. He is following a generally healthy diet. Meal planning includes avoidance of concentrated sweets. He has not had a previous visit with a dietician. He participates in exercise intermittently. His home blood glucose trend is increasing steadily. His breakfast blood glucose range is generally 140-180 mg/dl. His lunch blood glucose range is generally 140-180 mg/dl. His dinner blood glucose range is generally 180-200 mg/dl. His highest blood glucose is 180-200 mg/dl. His overall blood glucose range is 180-200 mg/dl. An ACE inhibitor/angiotensin II receptor blocker is being taken. He does not see a podiatrist.Eye exam is not current.  Hypertension This is a chronic problem. The current episode started more than 1 year ago. The problem is unchanged. The problem is uncontrolled. Pertinent negatives include no anxiety, blurred vision, chest pain, headaches, malaise/fatigue, neck pain, orthopnea, palpitations, peripheral edema, PND, shortness of breath or sweats. Past treatments include ACE inhibitors. The current treatment provides mild improvement. Compliance problems include diet and exercise.       Review of Systems  Constitutional: Negative  for fever, chills, malaise/fatigue, diaphoresis, activity change, appetite change, fatigue and unexpected weight change.  HENT: Negative for sore throat, facial swelling, trouble swallowing, neck pain, neck stiffness and voice change.   Eyes: Negative.  Negative for blurred vision.  Respiratory: Negative for apnea, cough, choking, chest tightness, shortness of breath, wheezing and stridor.   Cardiovascular: Negative for chest pain, palpitations, orthopnea, leg swelling and PND.  Gastrointestinal: Negative for nausea, vomiting, abdominal pain, diarrhea and constipation.  Genitourinary: Negative for dysuria, urgency, frequency, hematuria, flank pain, decreased urine volume, enuresis and difficulty urinating.  Musculoskeletal: Negative for myalgias, back pain, joint swelling, arthralgias and gait problem.  Skin: Negative for color change, pallor, rash and wound.  Neurological: Negative for dizziness, tremors, seizures, syncope, facial asymmetry, speech difficulty, weakness, light-headedness, numbness and headaches.  Hematological: Negative for adenopathy. Does not bruise/bleed easily.  Psychiatric/Behavioral: Negative.        Objective:   Physical Exam  Vitals reviewed. Constitutional: He is oriented to person, place, and time. He appears well-developed and well-nourished. No distress.  HENT:  Head: Normocephalic and atraumatic.  Right Ear: External ear normal.  Left Ear: External ear normal.  Nose: Nose normal.  Mouth/Throat: Oropharynx is clear and moist. No oropharyngeal exudate.  Eyes: Conjunctivae and EOM are normal. Pupils are equal, round, and reactive to light. Right eye exhibits no discharge. Left eye exhibits no discharge. No scleral icterus.  Neck: Normal range of motion. Neck supple. No JVD present. No tracheal deviation present. No thyromegaly present.  Cardiovascular: Normal rate, regular rhythm, normal heart sounds and intact distal pulses.  Exam reveals no gallop and no  friction rub.   No murmur heard. Pulmonary/Chest: Effort normal and breath sounds normal. No stridor. No respiratory distress. He has no wheezes. He has no  rales. He exhibits no tenderness.  Abdominal: Soft. Bowel sounds are normal. He exhibits no distension and no mass. There is no tenderness. There is no rebound and no guarding.  Musculoskeletal: Normal range of motion. He exhibits no edema and no tenderness.  Lymphadenopathy:    He has no cervical adenopathy.  Neurological: He is alert and oriented to person, place, and time. He has normal reflexes. He displays normal reflexes. No cranial nerve deficit. He exhibits normal muscle tone. Coordination normal.  Skin: Skin is warm and dry. No rash noted. He is not diaphoretic. No erythema. No pallor.  Psychiatric: He has a normal mood and affect. His behavior is normal. Judgment and thought content normal.      Lab Results  Component Value Date   WBC 8.3 07/10/2010   HGB 14.1 07/10/2010   HCT 38.3* 07/10/2010   PLT 416* 07/10/2010   CHOL 170 09/14/2010   TRIG 185.0* 09/14/2010   HDL 30.90* 09/14/2010   LDLDIRECT 124.2 08/06/2010   ALT 19 04/20/2011   AST 21 04/20/2011   NA 139 04/20/2011   K 4.0 04/20/2011   CL 104 04/20/2011   CREATININE 0.9 04/20/2011   BUN 11 04/20/2011   CO2 30 04/20/2011   TSH 1.05 10/29/2009   HGBA1C 8.7* 04/20/2011   MICROALBUR 18.4* 07/10/2010      Assessment & Plan:

## 2011-05-05 NOTE — Assessment & Plan Note (Signed)
Continue crestor and tricor

## 2011-05-05 NOTE — Assessment & Plan Note (Signed)
He needs to restart his lantus

## 2011-05-05 NOTE — Patient Instructions (Signed)
Diabetes, Type 2 Diabetes is a lasting (chronic) disease. In type 2 diabetes, the pancreas does not make enough insulin (a hormone), and the body does not respond normally to the insulin that is made. This type of diabetes was also previously called adult onset diabetes. About 90% of all those who have diabetes have type 2. It usually occurs after the age of 55 but can occur at any age. CAUSES Unlike type 1 diabetes, which happens because insulin is no longer being made, type 2 diabetes happens because the body is making less insulin and has trouble using the insulin properly. SYMPTOMS  Drinking more than usual.   Urinating more than usual.   Blurred vision.   Dry, itchy skin.   Frequent infection like yeast infections in women.   More tired than usual (fatigue).  TREATMENT  Healthy eating.   Exercise.   Medication, if needed.   Monitoring blood glucose (sugar).   Seeing your caregiver regularly.  HOME CARE INSTRUCTIONS  Check your blood glucose (sugar) at least once daily. More frequent monitoring may be necessary, depending on your medications and on how well your diabetes is controlled. Your caregiver will advise you.   Take your medicine as directed by your caregiver.   Do not smoke.   Make wise food choices. Ask your caregiver for information. Weight loss can improve your diabetes.   Learn about low blood glucose (hypoglycemia) and how to treat it.   Get your eyes checked regularly.   Have a yearly physical exam. Have your blood pressure checked. Get your blood and urine tested.   Wear a pendant or bracelet saying that you have diabetes.   Check your feet every night for sores. Let your caregiver know if you have sores that are not healing.  SEEK MEDICAL CARE IF:  You are having problems keeping your blood glucose at target range.   You feel you might be having problems with your medicines.   You have symptoms of an illness that is not improving after 24  hours.   You have a sore or wound that is not healing.   You notice a change in vision or a new problem with your vision.   You develop a fever of more than 100.5.  Document Released: 09/20/2005 Document Re-Released: 10/12/2009 [facility] Patient Information 2011 [place], [place].A1c, Hemoglobin A1c   The A1c test checks the average amount of glucose (sugar) in the blood over the last 2 to 3 months. It does this by measuring the concentration of glycosylated hemoglobin. As glucose circulates in the blood, some of it binds to hemoglobin A. This is the main form of hemoglobin in adults. Hemoglobin is a red protein that carries oxygen in the red blood cells (RBC's). Once the glucose is bound to the hemoglobin A, it remains there for the life of the red blood cell (about 120 days). This combination of glucose and hemoglobin A is called A1c (or hemoglobin A1c or glycohemoglobin). Increased glucose in the blood, increases the hemoglobin A1c. A1c levels do not change quickly but will shift as older RBC's die and younger ones take their place.   The A1c test is used primarily to monitor the glucose control of diabetics over time. The goal of those with diabetes is to keep their blood glucose levels as close to normal as possible. This helps to minimize the complications caused by chronically elevated glucose levels, such as progressive damage to body organs like the kidneys, eyes, cardiovascular system, and nerves. The  A1c test gives a picture of the average amount of glucose in the blood over the last few months. It can help a patient and his doctor know if the measures they are taking to control the patient's diabetes are successful or need to be adjusted.    Depending on the type of diabetes that you have, how well your diabetes is controlled, and your doctor, your A1c may be measured 2 to 4 times each year. The American Diabetes Association (ADA) recommends testing your A1c 4 times each year if you have type  1 or type 2 diabetes and use insulin; or 2 times each year if you have type 2 diabetes and do not use insulin. When someone is first diagnosed with diabetes or if control is not good, A1c may be ordered more frequently.   PREPARATION FOR TEST:  No preparation or fasting is necessary. A blood sample is taken by a needle from a vein or a drop of blood can be taken from your finger.   NORMAL VALUES  Non diabetic adults: 2.2%-4.8%  Non diabetic child: 1.8%-4.0%  Good diabetic control: 2.5%-5.9%  Fair diabetic control: 6%-8%  Poor diabetic control: greater than 8%   MEANING OF TEST Your caregiver will go over the test results with you and discuss the importance and meaning of your results, as well as treatment options and the need for additional tests if necessary.   OBTAINING THE TEST RESULTS It is your responsibility to obtain your test results.  Ask the lab or department performing the test when and how you will get your results.   **"Normal" ranges for lab values and other tests may vary among different laboratories and/or hospitals.  You should always check with your doctor after having lab work or other tests done to discuss the meaning of your test results and whether or not your values are considered "within normal limits".   Document Released: 12/17/2008   [facility] Patient Information 2011 [place], [place].

## 2011-05-06 NOTE — Assessment & Plan Note (Signed)
He will improve his compliance with meds and his lifestyle modifications

## 2011-05-06 NOTE — Assessment & Plan Note (Signed)
Will continue the same for now 

## 2011-05-06 NOTE — Assessment & Plan Note (Signed)
I asked him to control his diabetes better and to stay on the ACEI

## 2011-05-10 ENCOUNTER — Other Ambulatory Visit: Payer: Self-pay | Admitting: Cardiovascular Disease

## 2011-05-29 ENCOUNTER — Encounter (HOSPITAL_BASED_OUTPATIENT_CLINIC_OR_DEPARTMENT_OTHER): Payer: Self-pay | Admitting: *Deleted

## 2011-05-29 ENCOUNTER — Emergency Department (HOSPITAL_BASED_OUTPATIENT_CLINIC_OR_DEPARTMENT_OTHER)
Admission: EM | Admit: 2011-05-29 | Discharge: 2011-05-29 | Disposition: A | Discharge status: Home / Self Care | Payer: Federal, State, Local not specified - PPO | Attending: Emergency Medicine | Admitting: Emergency Medicine

## 2011-05-29 DIAGNOSIS — E119 Type 2 diabetes mellitus without complications: Secondary | ICD-10-CM | POA: Insufficient documentation

## 2011-05-29 DIAGNOSIS — E785 Hyperlipidemia, unspecified: Secondary | ICD-10-CM | POA: Insufficient documentation

## 2011-05-29 DIAGNOSIS — I10 Essential (primary) hypertension: Secondary | ICD-10-CM | POA: Insufficient documentation

## 2011-05-29 DIAGNOSIS — J029 Acute pharyngitis, unspecified: Secondary | ICD-10-CM | POA: Insufficient documentation

## 2011-05-29 MED ORDER — AMOXICILLIN 500 MG PO CAPS: 500.0000 mg | ORAL_CAPSULE | Freq: Three times a day (TID) | ORAL | Status: AC

## 2011-05-29 MED ORDER — AMOXICILLIN 500 MG PO CAPS
500.0000 mg | ORAL_CAPSULE | Freq: Once | ORAL | Status: AC
  Administered 2011-05-29: 500 mg via ORAL
  Filled 2011-05-29: qty 1

## 2011-05-29 NOTE — ED Notes (Signed)
Pt presents to ED today with c/o sore throat that began around 0100.  Pt states "feels like theres something in there".  Pt tried coughing with no success in dislodging.

## 2011-05-29 NOTE — ED Provider Notes (Signed)
History     CSN: [id number] Arrival date & time: 05/29/2011  4:21 AM  Chief Complaint  Patient presents with  . Sore Throat   Patient is a 45 y.o. male presenting with pharyngitis. The history is provided by the patient.  Sore Throat This is a new problem. The current episode started 1 to 2 hours ago. The problem occurs constantly. The problem has not changed since onset.Pertinent negatives include no chest pain, no abdominal pain, no headaches and no shortness of breath. The symptoms are aggravated by swallowing. The symptoms are relieved by nothing. He has tried nothing for the symptoms.  Ate pizza earlier and went to bed and woke up with a sore throat. No F/C, no cough and no sick contacts. No rash or recent illness otherwise, no SOB or trouble breathing but intially complained of unable to clear his throat like something was in there that made him anxious. Hurts to swallow, no unilateral pain.   Past Medical History  Diagnosis Date  . Hyperlipidemia   . Diabetes mellitus   . Hypertension     History reviewed. No pertinent past surgical history.  Family History  Problem Relation Age of Onset  . Diabetes Mother   . Heart disease Father   . Heart disease Brother     History  Substance Use Topics  . Smoking status: Never Smoker   . Smokeless tobacco: Not on file  . Alcohol Use: 1.2 oz/week    2 Cans of beer per week     on occasion, not in excess      Review of Systems  Constitutional: Negative for fever and chills.  HENT: Positive for sore throat. Negative for trouble swallowing, neck pain, neck stiffness and voice change.   Eyes: Negative for pain.  Respiratory: Negative for apnea, cough, choking, chest tightness, shortness of breath, wheezing and stridor.   Cardiovascular: Negative for chest pain.  Gastrointestinal: Negative for abdominal pain.  Genitourinary: Negative for dysuria.  Musculoskeletal: Negative for back pain.  Skin: Negative for rash.  Neurological:  Negative for headaches.  All other systems reviewed and are negative.    Physical Exam  BP 130/99  Pulse 76  Temp(Src) 98.6 F (37 C) (Oral)  SpO2 99%  Physical Exam  Nursing note and vitals reviewed. Constitutional: He is oriented to person, place, and time. He appears well-developed and well-nourished.  HENT:  Head: Normocephalic and atraumatic.  Mouth/Throat: Oropharynx is clear and moist and mucous membranes are normal. No oral lesions.       Posterior pharynx erythematous with enlarged tonsils and uvula. No exudates, no trismus, no uvular deviation.   Eyes: Conjunctivae and EOM are normal. Pupils are equal, round, and reactive to light.  Neck: Trachea normal. Neck supple. No tracheal tenderness present. No rigidity. Normal range of motion present. No mass and no thyromegaly present.       No tender lymphadenopathy  Cardiovascular: Normal rate, regular rhythm, S1 normal, S2 normal and normal pulses.     No systolic murmur is present   No diastolic murmur is present  Pulses:      Radial pulses are 2+ on the right side, and 2+ on the left side.  Pulmonary/Chest: Effort normal and breath sounds normal. No stridor. He has no wheezes. He has no rhonchi. He has no rales. He exhibits no tenderness.  Abdominal: Soft. Normal appearance and bowel sounds are normal. There is no tenderness. There is no CVA tenderness and negative Murphy's sign.  Musculoskeletal:  BLE:s Calves nontender, no cords or erythema, negative Homans sign  Neurological: He is alert and oriented to person, place, and time. He has normal strength. No cranial nerve deficit or sensory deficit. GCS eye subscore is 4. GCS verbal subscore is 5. GCS motor subscore is 6.  Skin: Skin is warm and dry. No rash noted. He is not diaphoretic.  Psychiatric: His speech is normal.       Cooperative and appropriate    ED Course  Procedures  MDM Clinical pharyngitis in adult diabetic male. PO ABx, no airway stridor or  wheezes/ stable for discharge with close PCP follow up. Reliable historian understands strict return precautions.       Sunnie Nielsen, MD 05/29/11 [phone]

## 2011-05-29 NOTE — ED Notes (Signed)
Family at bedside. 

## 2011-07-07 ENCOUNTER — Other Ambulatory Visit (INDEPENDENT_AMBULATORY_CARE_PROVIDER_SITE_OTHER): Payer: Federal, State, Local not specified - PPO

## 2011-07-07 ENCOUNTER — Encounter: Payer: Self-pay | Admitting: Internal Medicine

## 2011-07-07 ENCOUNTER — Ambulatory Visit (INDEPENDENT_AMBULATORY_CARE_PROVIDER_SITE_OTHER): Payer: Federal, State, Local not specified - PPO | Admitting: Internal Medicine

## 2011-07-07 ENCOUNTER — Other Ambulatory Visit: Payer: Self-pay | Admitting: Internal Medicine

## 2011-07-07 DIAGNOSIS — I10 Essential (primary) hypertension: Secondary | ICD-10-CM

## 2011-07-07 DIAGNOSIS — E119 Type 2 diabetes mellitus without complications: Secondary | ICD-10-CM

## 2011-07-07 DIAGNOSIS — R809 Proteinuria, unspecified: Secondary | ICD-10-CM

## 2011-07-07 DIAGNOSIS — E7800 Pure hypercholesterolemia, unspecified: Secondary | ICD-10-CM

## 2011-07-07 LAB — LIPID PANEL
Cholesterol: 239 mg/dL — ABNORMAL HIGH (ref 0–200)
HDL: 32.2 mg/dL — ABNORMAL LOW (ref >39.00)
Total CHOL/HDL Ratio: 7
Triglycerides: 1000 mg/dL — ABNORMAL HIGH (ref 0.0–149.0)

## 2011-07-07 LAB — COMPREHENSIVE METABOLIC PANEL
ALT: 22 U/L (ref 0–53)
CO2: 26 mEq/L (ref 19–32)
Creatinine, Ser: 0.8 mg/dL (ref 0.4–1.5)
GFR: 134.11 mL/min (ref >60.00)
Glucose, Bld: 110 mg/dL — ABNORMAL HIGH (ref 70–99)
Total Bilirubin: 0.2 mg/dL — ABNORMAL LOW (ref 0.3–1.2)

## 2011-07-07 LAB — CBC WITH DIFFERENTIAL/PLATELET
Basophils Absolute: 0.1 10*3/uL (ref 0.0–0.1)
Eosinophils Relative: 2.2 % (ref 0.0–5.0)
HCT: 39.7 % (ref 39.0–52.0)
Lymphs Abs: 4.2 10*3/uL — ABNORMAL HIGH (ref 0.7–4.0)
MCV: 82.9 fl (ref 78.0–100.0)
Monocytes Absolute: 0.7 10*3/uL (ref 0.1–1.0)
Monocytes Relative: 7.8 % (ref 3.0–12.0)
Neutrophils Relative %: 39.5 % — ABNORMAL LOW (ref 43.0–77.0)
Platelets: 364 10*3/uL (ref 150.0–400.0)
RDW: 14.1 % (ref 11.5–14.6)
WBC: 8.6 10*3/uL (ref 4.5–10.5)

## 2011-07-07 MED ORDER — OLMESARTAN MEDOXOMIL 40 MG PO TABS: 40.0000 mg | ORAL_TABLET | Freq: Every day | ORAL | Status: DC

## 2011-07-07 NOTE — Assessment & Plan Note (Signed)
Change to benicar

## 2011-07-07 NOTE — Patient Instructions (Signed)
Diabetes, Type 2 Diabetes is a lasting (chronic) disease. In type 2 diabetes, the pancreas does not make enough insulin (a hormone), and the body does not respond normally to the insulin that is made. This type of diabetes was also previously called adult onset diabetes. About 90% of all those who have diabetes have type 2. It usually occurs after the age of 40 but can occur at any age. CAUSES Unlike type 1 diabetes, which happens because insulin is no longer being made, type 2 diabetes happens because the body is making less insulin and has trouble using the insulin properly. SYMPTOMS  Drinking more than usual.   Urinating more than usual.   Blurred vision.   Dry, itchy skin.   Frequent infection like yeast infections in women.   More tired than usual (fatigue).  TREATMENT  Healthy eating.   Exercise.   Medication, if needed.   Monitoring blood glucose (sugar).   Seeing your caregiver regularly.  HOME CARE INSTRUCTIONS  Check your blood glucose (sugar) at least once daily. More frequent monitoring may be necessary, depending on your medications and on how well your diabetes is controlled. Your caregiver will advise you.   Take your medicine as directed by your caregiver.   Do not smoke.   Make wise food choices. Ask your caregiver for information. Weight loss can improve your diabetes.   Learn about low blood glucose (hypoglycemia) and how to treat it.   Get your eyes checked regularly.   Have a yearly physical exam. Have your blood pressure checked. Get your blood and urine tested.   Wear a pendant or bracelet saying that you have diabetes.   Check your feet every night for sores. Let your caregiver know if you have sores that are not healing.  SEEK MEDICAL CARE IF:  You are having problems keeping your blood glucose at target range.   You feel you might be having problems with your medicines.   You have symptoms of an illness that is not improving after 24  hours.   You have a sore or wound that is not healing.   You notice a change in vision or a new problem with your vision.   You develop a fever of more than 100.5.  Document Released: 09/20/2005 Document Re-Released: 10/12/2009 [employer] Patient Information 2011 [employer]. 

## 2011-07-07 NOTE — Assessment & Plan Note (Signed)
Stop the ACEI due to the cough and start Benicar

## 2011-07-07 NOTE — Progress Notes (Signed)
Subjective:    Patient ID: Tim Wilson, male    DOB: July 29, 1966, 45 y.o.   MRN: [id number]  Hypertension This is a chronic problem. The current episode started more than 1 year ago. The problem has been gradually improving since onset. The problem is controlled. Pertinent negatives include no anxiety, blurred vision, chest pain, headaches, malaise/fatigue, neck pain, orthopnea, palpitations, peripheral edema, PND, shortness of breath or sweats. There are no associated agents to hypertension. Past treatments include ACE inhibitors. The current treatment provides significant improvement. Compliance problems include medication side effects (chronic, lingering non-productive cough).   Diabetes He presents for his follow-up diabetic visit. He has type 2 diabetes mellitus. His disease course has been stable. There are no hypoglycemic associated symptoms. Pertinent negatives for hypoglycemia include no dizziness, headaches, seizures, speech difficulty, sweats or tremors. Pertinent negatives for diabetes include no blurred vision, no chest pain, no fatigue, no foot paresthesias, no foot ulcerations, no polydipsia, no polyphagia, no polyuria, no visual change, no weakness and no weight loss. There are no hypoglycemic complications. Symptoms are stable. There are no diabetic complications. Current diabetic treatment includes intensive insulin program. He is compliant with treatment some of the time. His weight is stable. He is following a generally healthy diet. Meal planning includes avoidance of concentrated sweets. He has not had a previous visit with a dietician. He participates in exercise every other day. His breakfast blood glucose range is generally 70-90 mg/dl. His lunch blood glucose range is generally 110-130 mg/dl. His dinner blood glucose range is generally 130-140 mg/dl. His highest blood glucose is 140-180 mg/dl. His overall blood glucose range is 130-140 mg/dl. An ACE inhibitor/angiotensin II  receptor blocker is being taken.      Review of Systems  Constitutional: Negative for fever, chills, weight loss, malaise/fatigue, diaphoresis, activity change, appetite change, fatigue and unexpected weight change.  HENT: Negative.  Negative for neck pain.   Eyes: Negative.  Negative for blurred vision.  Respiratory: Negative for apnea, cough, choking, chest tightness, shortness of breath, wheezing and stridor.   Cardiovascular: Negative for chest pain, palpitations, orthopnea, leg swelling and PND.  Gastrointestinal: Negative.   Genitourinary: Negative for dysuria, urgency, polyuria, frequency, hematuria, flank pain, decreased urine volume, enuresis and difficulty urinating.  Musculoskeletal: Negative for myalgias, back pain, joint swelling, arthralgias and gait problem.  Skin: Negative.   Neurological: Negative for dizziness, tremors, seizures, syncope, facial asymmetry, speech difficulty, weakness, light-headedness, numbness and headaches.  Hematological: Negative for polydipsia, polyphagia and adenopathy. Does not bruise/bleed easily.  Psychiatric/Behavioral: Negative.        Objective:   Physical Exam  Vitals reviewed. Constitutional: He is oriented to person, place, and time. He appears well-developed and well-nourished. No distress.  HENT:  Head: Normocephalic and atraumatic.  Mouth/Throat: Oropharynx is clear and moist. No oropharyngeal exudate.  Eyes: Conjunctivae are normal. Right eye exhibits no discharge. Left eye exhibits no discharge. No scleral icterus.  Neck: Normal range of motion. Neck supple. No JVD present. No tracheal deviation present. No thyromegaly present.  Cardiovascular: Normal rate, regular rhythm, normal heart sounds and intact distal pulses.  Exam reveals no gallop and no friction rub.   No murmur heard. Pulmonary/Chest: Effort normal and breath sounds normal. No stridor. No respiratory distress. He has no wheezes. He has no rales. He exhibits no  tenderness.  Abdominal: Soft. Bowel sounds are normal. He exhibits no distension and no mass. There is no tenderness. There is no rebound and no guarding.  Musculoskeletal: Normal range  of motion. He exhibits no edema and no tenderness.  Lymphadenopathy:    He has no cervical adenopathy.  Neurological: He is oriented to person, place, and time. He displays normal reflexes. He exhibits normal muscle tone. Coordination normal.  Skin: Skin is warm and dry. No rash noted. He is not diaphoretic. No erythema. No pallor.  Psychiatric: He has a normal mood and affect. His behavior is normal. Judgment and thought content normal.      Lab Results  Component Value Date   WBC 8.3 07/10/2010   HGB 14.1 07/10/2010   HCT 38.3* 07/10/2010   PLT 416* 07/10/2010   CHOL 170 09/14/2010   TRIG 185.0* 09/14/2010   HDL 30.90* 09/14/2010   LDLDIRECT 124.2 08/06/2010   ALT 19 04/20/2011   AST 21 04/20/2011   NA 139 04/20/2011   K 4.0 04/20/2011   CL 104 04/20/2011   CREATININE 0.9 04/20/2011   BUN 11 04/20/2011   CO2 30 04/20/2011   TSH 1.05 10/29/2009   HGBA1C 8.7* 04/20/2011   MICROALBUR 18.4* 07/10/2010      Assessment & Plan:

## 2011-07-07 NOTE — Assessment & Plan Note (Signed)
I will check his A1C today and will monitor his renal function 

## 2011-07-07 NOTE — Assessment & Plan Note (Signed)
Continue current meds and check his FLP today

## 2011-07-09 ENCOUNTER — Encounter: Payer: Self-pay | Admitting: Internal Medicine

## 2011-07-09 LAB — HEMOGLOBIN A1C: Hgb A1c MFr Bld: 8.2 % — ABNORMAL HIGH (ref 4.6–6.5)

## 2011-08-02 ENCOUNTER — Other Ambulatory Visit: Payer: Self-pay | Admitting: Cardiovascular Disease

## 2011-08-03 ENCOUNTER — Other Ambulatory Visit: Payer: Self-pay | Admitting: *Deleted

## 2011-08-03 ENCOUNTER — Telehealth: Payer: Self-pay | Admitting: Cardiovascular Disease

## 2011-08-03 MED ORDER — FENOFIBRATE 145 MG PO TABS: 145.0000 mg | ORAL_TABLET | Freq: Every day | ORAL | Status: DC

## 2011-08-03 NOTE — Telephone Encounter (Signed)
Walk In pt Form " pt needs Refill" sent to [facility]  08/03/11/km

## 2011-10-08 ENCOUNTER — Telehealth: Payer: Self-pay

## 2011-10-08 DIAGNOSIS — E119 Type 2 diabetes mellitus without complications: Secondary | ICD-10-CM

## 2011-10-08 MED ORDER — GLUCOSE BLOOD VI STRP: ORAL_STRIP | Status: DC

## 2011-10-08 NOTE — Telephone Encounter (Signed)
Refill

## 2012-01-06 ENCOUNTER — Other Ambulatory Visit: Payer: Self-pay | Admitting: Cardiovascular Disease

## 2012-07-11 ENCOUNTER — Encounter: Payer: Federal, State, Local not specified - PPO | Admitting: Internal Medicine

## 2012-07-13 ENCOUNTER — Other Ambulatory Visit (INDEPENDENT_AMBULATORY_CARE_PROVIDER_SITE_OTHER): Payer: Federal, State, Local not specified - PPO

## 2012-07-13 ENCOUNTER — Encounter: Payer: Self-pay | Admitting: Internal Medicine

## 2012-07-13 ENCOUNTER — Ambulatory Visit (INDEPENDENT_AMBULATORY_CARE_PROVIDER_SITE_OTHER): Payer: Federal, State, Local not specified - PPO | Admitting: Internal Medicine

## 2012-07-13 VITALS — BP 118/82 | HR 68 | Temp 97.7°F | Resp 16 | Wt 213.0 lb

## 2012-07-13 DIAGNOSIS — R809 Proteinuria, unspecified: Secondary | ICD-10-CM

## 2012-07-13 DIAGNOSIS — Z0000 Encounter for general adult medical examination without abnormal findings: Secondary | ICD-10-CM | POA: Insufficient documentation

## 2012-07-13 DIAGNOSIS — E7800 Pure hypercholesterolemia, unspecified: Secondary | ICD-10-CM

## 2012-07-13 DIAGNOSIS — E119 Type 2 diabetes mellitus without complications: Secondary | ICD-10-CM

## 2012-07-13 DIAGNOSIS — I10 Essential (primary) hypertension: Secondary | ICD-10-CM

## 2012-07-13 DIAGNOSIS — Z23 Encounter for immunization: Secondary | ICD-10-CM

## 2012-07-13 DIAGNOSIS — E781 Pure hyperglyceridemia: Secondary | ICD-10-CM

## 2012-07-13 LAB — COMPREHENSIVE METABOLIC PANEL
ALT: 25 U/L (ref 0–53)
Alkaline Phosphatase: 42 U/L (ref 39–117)
CO2: 30 mEq/L (ref 19–32)
Creatinine, Ser: 0.9 mg/dL (ref 0.4–1.5)
GFR: 110.84 mL/min (ref >60.00)
Total Bilirubin: 0.5 mg/dL (ref 0.3–1.2)

## 2012-07-13 LAB — CBC WITH DIFFERENTIAL/PLATELET
Eosinophils Relative: 2.1 % (ref 0.0–5.0)
HCT: 44.8 % (ref 39.0–52.0)
Hemoglobin: 14.7 g/dL (ref 13.0–17.0)
Lymphs Abs: 3.8 10*3/uL (ref 0.7–4.0)
Monocytes Relative: 7.5 % (ref 3.0–12.0)
Neutro Abs: 3.8 10*3/uL (ref 1.4–7.7)
WBC: 8.4 10*3/uL (ref 4.5–10.5)

## 2012-07-13 LAB — URINALYSIS, ROUTINE W REFLEX MICROSCOPIC
Bilirubin Urine: NEGATIVE
Leukocytes, UA: NEGATIVE
Nitrite: NEGATIVE

## 2012-07-13 LAB — LIPID PANEL
Cholesterol: 261 mg/dL — ABNORMAL HIGH (ref 0–200)
HDL: 33.8 mg/dL — ABNORMAL LOW (ref >39.00)
Triglycerides: 1100 mg/dL — ABNORMAL HIGH (ref 0.0–149.0)

## 2012-07-13 LAB — HEMOGLOBIN A1C: Hgb A1c MFr Bld: 8 % — ABNORMAL HIGH (ref 4.6–6.5)

## 2012-07-13 LAB — HM DIABETES FOOT EXAM: HM Diabetic Foot Exam: NORMAL

## 2012-07-13 LAB — TSH: TSH: 1.46 u[IU]/mL (ref 0.35–5.50)

## 2012-07-13 NOTE — Assessment & Plan Note (Signed)
His BP is well controlled, I will check his lytes and renal function 

## 2012-07-13 NOTE — Assessment & Plan Note (Signed)
Exam done, vaccines were updated, labs ordered, pt ed material was given 

## 2012-07-13 NOTE — Assessment & Plan Note (Signed)
He is doing well on his current regimen, I will check his FLP today

## 2012-07-13 NOTE — Patient Instructions (Addendum)
Health Maintenance, Males A healthy lifestyle and preventative care can promote health and wellness.  Maintain regular health, dental, and eye exams.  Eat a healthy diet. Foods like vegetables, fruits, whole grains, low-fat dairy products, and lean protein foods contain the nutrients you need without too many calories. Decrease your intake of foods high in solid fats, added sugars, and salt. Get information about a proper diet from your caregiver, if necessary.  Regular physical exercise is one of the most important things you can do for your health. Most adults should get at least 150 minutes of moderate-intensity exercise (any activity that increases your heart rate and causes you to sweat) each week. In addition, most adults need muscle-strengthening exercises on 2 or more days a week.   Maintain a healthy weight. The body mass index (BMI) is a screening tool to identify possible weight problems. It provides an estimate of body fat based on height and weight. Your caregiver can help determine your BMI, and can help you achieve or maintain a healthy weight. For adults 20 years and older:  A BMI below 18.5 is considered underweight.  A BMI of 18.5 to 24.9 is normal.  A BMI of 25 to 29.9 is considered overweight.  A BMI of 30 and above is considered obese.  Maintain normal blood lipids and cholesterol by exercising and minimizing your intake of saturated fat. Eat a balanced diet with plenty of fruits and vegetables. Blood tests for lipids and cholesterol should begin at age 20 and be repeated every 5 years. If your lipid or cholesterol levels are high, you are over 50, or you are a high risk for heart disease, you may need your cholesterol levels checked more frequently.Ongoing high lipid and cholesterol levels should be treated with medicines, if diet and exercise are not effective.  If you smoke, find out from your caregiver how to quit. If you do not use tobacco, do not start.  If you  choose to drink alcohol, do not exceed 2 drinks per day. One drink is considered to be 12 ounces (355 mL) of beer, 5 ounces (148 mL) of wine, or 1.5 ounces (44 mL) of liquor.  Avoid use of street drugs. Do not share needles with anyone. Ask for help if you need support or instructions about stopping the use of drugs.  High blood pressure causes heart disease and increases the risk of stroke. Blood pressure should be checked at least every 1 to 2 years. Ongoing high blood pressure should be treated with medicines if weight loss and exercise are not effective.  If you are 45 to 46 years old, ask your caregiver if you should take aspirin to prevent heart disease.  Diabetes screening involves taking a blood sample to check your fasting blood sugar level. This should be done once every 3 years, after age 45, if you are within normal weight and without risk factors for diabetes. Testing should be considered at a younger age or be carried out more frequently if you are overweight and have at least 1 risk factor for diabetes.  Colorectal cancer can be detected and often prevented. Most routine colorectal cancer screening begins at the age of 50 and continues through age 75. However, your caregiver may recommend screening at an earlier age if you have risk factors for colon cancer. On a yearly basis, your caregiver may provide home test kits to check for hidden blood in the stool. Use of a small camera at the end of a tube,   to directly examine the colon (sigmoidoscopy or colonoscopy), can detect the earliest forms of colorectal cancer. Talk to your caregiver about this at age 50, when routine screening begins. Direct examination of the colon should be repeated every 5 to 10 years through age 75, unless early forms of pre-cancerous polyps or small growths are found.  Hepatitis C blood testing is recommended for all people born from 1945 through 1965 and any individual with known risks for hepatitis C.  Healthy  men should no longer receive prostate-specific antigen (PSA) blood tests as part of routine cancer screening. Consult with your caregiver about prostate cancer screening.  Testicular cancer screening is not recommended for adolescents or adult males who have no symptoms. Screening includes self-exam, caregiver exam, and other screening tests. Consult with your caregiver about any symptoms you have or any concerns you have about testicular cancer.  Practice safe sex. Use condoms and avoid high-risk sexual practices to reduce the spread of sexually transmitted infections (STIs).  Use sunscreen with a sun protection factor (SPF) of 30 or greater. Apply sunscreen liberally and repeatedly throughout the day. You should seek shade when your shadow is shorter than you. Protect yourself by wearing long sleeves, pants, a wide-brimmed hat, and sunglasses year round, whenever you are outdoors.  Notify your caregiver of new moles or changes in moles, especially if there is a change in shape or color. Also notify your caregiver if a mole is larger than the size of a pencil eraser.  A one-time screening for abdominal aortic aneurysm (AAA) and surgical repair of large AAAs by sound wave imaging (ultrasonography) is recommended for ages 65 to 75 years who are current or former smokers.  Stay current with your immunizations. Document Released: 03/18/2008 Document Revised: 12/13/2011 Document Reviewed: 02/15/2011 [employer] Patient Information 2013 [employer].  

## 2012-07-13 NOTE — Assessment & Plan Note (Signed)
I will check his a1c and will monitor his renal function 

## 2012-07-13 NOTE — Assessment & Plan Note (Signed)
BMP and UA today 

## 2012-07-13 NOTE — Assessment & Plan Note (Signed)
FLP today 

## 2012-07-13 NOTE — Progress Notes (Signed)
Subjective:    Patient ID: Tim Wilson, male    DOB: [birth year], 46 y.o.   MRN: [id number]  Diabetes He presents for his follow-up diabetic visit. He has type 2 diabetes mellitus. His disease course has been stable. There are no hypoglycemic associated symptoms. Pertinent negatives for hypoglycemia include no dizziness, pallor or tremors. Pertinent negatives for diabetes include no blurred vision, no chest pain, no fatigue, no foot paresthesias, no foot ulcerations, no polydipsia, no polyphagia, no polyuria, no visual change, no weakness and no weight loss. There are no hypoglycemic complications. Symptoms are stable. There are no diabetic complications. Current diabetic treatment includes diet. He is compliant with treatment all of the time. His weight is stable. He is following a generally healthy diet. Meal planning includes avoidance of concentrated sweets. He has not had a previous visit with a dietician. He participates in exercise intermittently. His breakfast blood glucose range is generally 90-110 mg/dl. His lunch blood glucose range is generally 110-130 mg/dl. His dinner blood glucose range is generally 110-130 mg/dl. His highest blood glucose is 130-140 mg/dl. His overall blood glucose range is 110-130 mg/dl. An ACE inhibitor/angiotensin II receptor blocker is not being taken. He does not see a podiatrist.Eye exam is not current.      Review of Systems  Constitutional: Negative for fever, chills, weight loss, diaphoresis, activity change, appetite change, fatigue and unexpected weight change.  HENT: Negative.   Eyes: Negative.  Negative for blurred vision.  Respiratory: Negative for cough, chest tightness, shortness of breath, wheezing and stridor.   Cardiovascular: Negative for chest pain, palpitations and leg swelling.  Gastrointestinal: Negative for nausea, vomiting, abdominal pain, diarrhea, constipation and blood in stool.  Genitourinary: Negative.  Negative for polyuria.    Musculoskeletal: Negative.   Skin: Negative for color change, pallor, rash and wound.  Neurological: Negative.  Negative for dizziness, tremors, weakness and light-headedness.  Hematological: Negative for polydipsia, polyphagia and adenopathy. Does not bruise/bleed easily.  Psychiatric/Behavioral: Negative.        Objective:   Physical Exam  Vitals reviewed. Constitutional: He is oriented to person, place, and time. He appears well-developed and well-nourished. No distress.  HENT:  Head: Normocephalic and atraumatic.  Mouth/Throat: Oropharynx is clear and moist. No oropharyngeal exudate.  Eyes: Conjunctivae normal are normal. Right eye exhibits no discharge. Left eye exhibits no discharge. No scleral icterus.  Neck: Normal range of motion. Neck supple. No JVD present. No tracheal deviation present. No thyromegaly present.  Cardiovascular: Normal rate, regular rhythm, normal heart sounds and intact distal pulses.  Exam reveals no gallop and no friction rub.   No murmur heard. Pulmonary/Chest: Effort normal and breath sounds normal. No stridor. No respiratory distress. He has no wheezes. He has no rales. He exhibits no tenderness.  Abdominal: Soft. Bowel sounds are normal. He exhibits no distension and no mass. There is no tenderness. There is no rebound and no guarding. Hernia confirmed negative in the right inguinal area and confirmed negative in the left inguinal area.  Genitourinary: Rectum normal, prostate normal, testes normal and penis normal. Rectal exam shows no external hemorrhoid, no internal hemorrhoid, no fissure, no mass, no tenderness and anal tone normal. Guaiac negative stool. Prostate is not enlarged and not tender. Right testis shows no mass, no swelling and no tenderness. Right testis is descended. Left testis shows no mass, no swelling and no tenderness. Left testis is descended. Uncircumcised. No phimosis, paraphimosis, hypospadias, penile erythema or penile tenderness. No  discharge found.  Musculoskeletal:  Normal range of motion. He exhibits no edema and no tenderness.  Lymphadenopathy:    He has no cervical adenopathy.       Right: No inguinal adenopathy present.       Left: No inguinal adenopathy present.  Neurological: He is oriented to person, place, and time.  Skin: Skin is warm and dry. No rash noted. He is not diaphoretic. No erythema. No pallor.  Psychiatric: He has a normal mood and affect. His behavior is normal. Judgment and thought content normal.     Lab Results  Component Value Date   WBC 8.6 07/07/2011   HGB 13.4 07/07/2011   HCT 39.7 07/07/2011   PLT 364.0 07/07/2011   GLUCOSE 110* 07/07/2011   CHOL 239* 07/07/2011   TRIG 1000.0 Triglyceride is over 400; calculations on Lipids are invalid.* 07/07/2011   HDL 32.20* 07/07/2011   LDLDIRECT 86.8 07/07/2011   LDLCALC 102* 09/14/2010   ALT 22 07/07/2011   AST 22 07/07/2011   NA 139 07/07/2011   K 3.8 07/07/2011   CL 107 07/07/2011   CREATININE 0.8 07/07/2011   BUN 10 07/07/2011   CO2 26 07/07/2011   TSH 1.05 10/29/2009   HGBA1C 8.2* 07/07/2011   MICROALBUR 18.4* 07/10/2010       Assessment & Plan:

## 2012-08-02 LAB — HM DIABETES EYE EXAM: HM Diabetic Eye Exam: NORMAL

## 2012-08-24 ENCOUNTER — Other Ambulatory Visit: Payer: Self-pay | Admitting: Cardiovascular Disease

## 2012-08-28 ENCOUNTER — Telehealth: Payer: Self-pay

## 2012-08-28 MED ORDER — RAMIPRIL 2.5 MG PO TABS: 2.5000 mg | ORAL_TABLET | Freq: Every day | ORAL | Status: DC

## 2012-08-28 MED ORDER — FENOFIBRATE 145 MG PO TABS: 145.0000 mg | ORAL_TABLET | Freq: Every day | ORAL | Status: DC

## 2012-08-28 NOTE — Telephone Encounter (Signed)
Patient called requesting refills on tricor and altace. RX sent via e-script

## 2012-09-15 ENCOUNTER — Telehealth: Payer: Self-pay

## 2012-09-15 MED ORDER — TRAMADOL HCL 50 MG PO TABS: 50.0000 mg | ORAL_TABLET | Freq: Two times a day (BID) | ORAL | Status: AC | PRN

## 2012-09-15 MED ORDER — INDOMETHACIN ER 75 MG PO CPCR: 75.0000 mg | ORAL_CAPSULE | Freq: Two times a day (BID) | ORAL | Status: AC

## 2012-09-15 NOTE — Telephone Encounter (Signed)
done

## 2012-09-15 NOTE — Telephone Encounter (Signed)
Patient notified per MD.

## 2012-09-15 NOTE — Telephone Encounter (Signed)
Patient called Tim Wilson c/o gout flare since 09/11/12. He states that in the past tramadol and an anti inflammatory has help with this before, he is not sure of the name of it.

## 2013-07-30 ENCOUNTER — Encounter: Payer: Self-pay | Admitting: Internal Medicine

## 2013-07-30 ENCOUNTER — Ambulatory Visit (INDEPENDENT_AMBULATORY_CARE_PROVIDER_SITE_OTHER): Payer: Federal, State, Local not specified - PPO | Admitting: Internal Medicine

## 2013-07-30 ENCOUNTER — Other Ambulatory Visit (INDEPENDENT_AMBULATORY_CARE_PROVIDER_SITE_OTHER): Payer: Federal, State, Local not specified - PPO

## 2013-07-30 VITALS — BP 130/80 | HR 80 | Temp 97.5°F | Resp 16 | Ht 69.0 in | Wt 214.0 lb

## 2013-07-30 DIAGNOSIS — R809 Proteinuria, unspecified: Secondary | ICD-10-CM

## 2013-07-30 DIAGNOSIS — I10 Essential (primary) hypertension: Secondary | ICD-10-CM

## 2013-07-30 DIAGNOSIS — Z0000 Encounter for general adult medical examination without abnormal findings: Secondary | ICD-10-CM

## 2013-07-30 DIAGNOSIS — E781 Pure hyperglyceridemia: Secondary | ICD-10-CM

## 2013-07-30 DIAGNOSIS — IMO0001 Reserved for inherently not codable concepts without codable children: Secondary | ICD-10-CM

## 2013-07-30 DIAGNOSIS — E7800 Pure hypercholesterolemia, unspecified: Secondary | ICD-10-CM

## 2013-07-30 DIAGNOSIS — Z23 Encounter for immunization: Secondary | ICD-10-CM

## 2013-07-30 DIAGNOSIS — E119 Type 2 diabetes mellitus without complications: Secondary | ICD-10-CM

## 2013-07-30 LAB — COMPREHENSIVE METABOLIC PANEL
ALT: 20 U/L (ref 0–53)
AST: 16 U/L (ref 0–37)
BUN: 9 mg/dL (ref 6–23)
CO2: 27 mEq/L (ref 19–32)
Calcium: 9.4 mg/dL (ref 8.4–10.5)
Chloride: 97 mEq/L (ref 96–112)
GFR: 123.93 mL/min (ref >60.00)
Glucose, Bld: 218 mg/dL — ABNORMAL HIGH (ref 70–99)
Potassium: 4.3 mEq/L (ref 3.5–5.1)
Sodium: 133 mEq/L — ABNORMAL LOW (ref 135–145)
Total Bilirubin: 0.9 mg/dL (ref 0.3–1.2)
Total Protein: 7.2 g/dL (ref 6.0–8.3)

## 2013-07-30 LAB — LIPID PANEL
HDL: 19.1 mg/dL — ABNORMAL LOW (ref >39.00)
Total CHOL/HDL Ratio: 21
Triglycerides: 3442 mg/dL — ABNORMAL HIGH (ref 0.0–149.0)

## 2013-07-30 LAB — CBC WITH DIFFERENTIAL/PLATELET
Basophils Absolute: 0.1 10*3/uL (ref 0.0–0.1)
Basophils Relative: 1 % (ref 0.0–3.0)
Eosinophils Absolute: 0.2 10*3/uL (ref 0.0–0.7)
HCT: 38.5 % — ABNORMAL LOW (ref 39.0–52.0)
Hemoglobin: 13.8 g/dL (ref 13.0–17.0)
Lymphs Abs: 3.7 10*3/uL (ref 0.7–4.0)
MCHC: 35.8 g/dL (ref 30.0–36.0)
MCV: 79.9 fl (ref 78.0–100.0)
Monocytes Relative: 7.2 % (ref 3.0–12.0)
Neutro Abs: 2.8 10*3/uL (ref 1.4–7.7)
Platelets: 318 10*3/uL (ref 150.0–400.0)
RDW: 13.5 % (ref 11.5–14.6)

## 2013-07-30 LAB — URINALYSIS, ROUTINE W REFLEX MICROSCOPIC
Bilirubin Urine: NEGATIVE
Ketones, ur: NEGATIVE
Nitrite: NEGATIVE
Total Protein, Urine: 30
pH: 6 (ref 5.0–8.0)

## 2013-07-30 MED ORDER — FENOFIBRATE 145 MG PO TABS: 145.0000 mg | ORAL_TABLET | Freq: Every day | ORAL | Status: DC

## 2013-07-30 MED ORDER — ROSUVASTATIN CALCIUM 20 MG PO TABS: 20.0000 mg | ORAL_TABLET | Freq: Every day | ORAL | Status: DC

## 2013-07-30 MED ORDER — OMEGA-3-ACID ETHYL ESTERS 1 G PO CAPS: 2.0000 g | ORAL_CAPSULE | Freq: Two times a day (BID) | ORAL | Status: DC

## 2013-07-30 MED ORDER — INSULIN DETEMIR 100 UNIT/ML ~~LOC~~ SOLN: 30.0000 [IU] | Freq: Every day | SUBCUTANEOUS | Status: DC

## 2013-07-30 MED ORDER — OLMESARTAN MEDOXOMIL 20 MG PO TABS: 20.0000 mg | ORAL_TABLET | Freq: Every day | ORAL | Status: DC

## 2013-07-30 MED ORDER — SITAGLIPTIN PHOS-METFORMIN HCL 50-1000 MG PO TABS: 1.0000 | ORAL_TABLET | Freq: Two times a day (BID) | ORAL | Status: DC

## 2013-07-30 NOTE — Addendum Note (Signed)
Addended by: Etta Grandchild on: 07/30/2013 04:31 PM   Modules accepted: Orders

## 2013-07-30 NOTE — Patient Instructions (Signed)
Health Maintenance, Males A healthy lifestyle and preventative care can promote health and wellness.  Maintain regular health, dental, and eye exams.  Eat a healthy diet. Foods like vegetables, fruits, whole grains, low-fat dairy products, and lean protein foods contain the nutrients you need without too many calories. Decrease your intake of foods high in solid fats, added sugars, and salt. Get information about a proper diet from your caregiver, if necessary.  Regular physical exercise is one of the most important things you can do for your health. Most adults should get at least 150 minutes of moderate-intensity exercise (any activity that increases your heart rate and causes you to sweat) each week. In addition, most adults need muscle-strengthening exercises on 2 or more days a week.   Maintain a healthy weight. The body mass index (BMI) is a screening tool to identify possible weight problems. It provides an estimate of body fat based on height and weight. Your caregiver can help determine your BMI, and can help you achieve or maintain a healthy weight. For adults 20 years and older:  A BMI below 18.5 is considered underweight.  A BMI of 18.5 to 24.9 is normal.  A BMI of 25 to 29.9 is considered overweight.  A BMI of 30 and above is considered obese.  Maintain normal blood lipids and cholesterol by exercising and minimizing your intake of saturated fat. Eat a balanced diet with plenty of fruits and vegetables. Blood tests for lipids and cholesterol should begin at age 20 and be repeated every 5 years. If your lipid or cholesterol levels are high, you are over 50, or you are a high risk for heart disease, you may need your cholesterol levels checked more frequently.Ongoing high lipid and cholesterol levels should be treated with medicines, if diet and exercise are not effective.  If you smoke, find out from your caregiver how to quit. If you do not use tobacco, do not start.  If you  choose to drink alcohol, do not exceed 2 drinks per day. One drink is considered to be 12 ounces (355 mL) of beer, 5 ounces (148 mL) of wine, or 1.5 ounces (44 mL) of liquor.  Avoid use of street drugs. Do not share needles with anyone. Ask for help if you need support or instructions about stopping the use of drugs.  High blood pressure causes heart disease and increases the risk of stroke. Blood pressure should be checked at least every 1 to 2 years. Ongoing high blood pressure should be treated with medicines if weight loss and exercise are not effective.  If you are 45 to 47 years old, ask your caregiver if you should take aspirin to prevent heart disease.  Diabetes screening involves taking a blood sample to check your fasting blood sugar level. This should be done once every 3 years, after age 45, if you are within normal weight and without risk factors for diabetes. Testing should be considered at a younger age or be carried out more frequently if you are overweight and have at least 1 risk factor for diabetes.  Colorectal cancer can be detected and often prevented. Most routine colorectal cancer screening begins at the age of 50 and continues through age 75. However, your caregiver may recommend screening at an earlier age if you have risk factors for colon cancer. On a yearly basis, your caregiver may provide home test kits to check for hidden blood in the stool. Use of a small camera at the end of a tube,   to directly examine the colon (sigmoidoscopy or colonoscopy), can detect the earliest forms of colorectal cancer. Talk to your caregiver about this at age 26, when routine screening begins. Direct examination of the colon should be repeated every 5 to 10 years through age 13, unless early forms of pre-cancerous polyps or small growths are found.  Hepatitis C blood testing is recommended for all people born from [birth year] through 1965 and any individual with known risks for hepatitis C.  Healthy  men should no longer receive prostate-specific antigen (PSA) blood tests as part of routine cancer screening. Consult with your caregiver about prostate cancer screening.  Testicular cancer screening is not recommended for adolescents or adult males who have no symptoms. Screening includes self-exam, caregiver exam, and other screening tests. Consult with your caregiver about any symptoms you have or any concerns you have about testicular cancer.  Practice safe sex. Use condoms and avoid high-risk sexual practices to reduce the spread of sexually transmitted infections (STIs).  Use sunscreen with a sun protection factor (SPF) of 30 or greater. Apply sunscreen liberally and repeatedly throughout the day. You should seek shade when your shadow is shorter than you. Protect yourself by wearing long sleeves, pants, a wide-brimmed hat, and sunglasses year round, whenever you are outdoors.  Notify your caregiver of new moles or changes in moles, especially if there is a change in shape or color. Also notify your caregiver if a mole is larger than the size of a pencil eraser.  A one-time screening for abdominal aortic aneurysm (AAA) and surgical repair of large AAAs by sound wave imaging (ultrasonography) is recommended for ages 44 to 28 years who are current or former smokers.  Stay current with your immunizations. Document Released: 03/18/2008 Document Revised: 12/13/2011 Document Reviewed: 02/15/2011 [facility] Patient Information 2014 [place], [place]. Type 2 Diabetes Mellitus, Adult Type 2 diabetes mellitus, often simply referred to as type 2 diabetes, is a long-lasting (chronic) disease. In type 2 diabetes, the pancreas does not make enough insulin (a hormone), the cells are less responsive to the insulin that is made (insulin resistance), or both. Normally, insulin moves sugars from food into the tissue cells. The tissue cells use the sugars for energy. The lack of insulin or the lack of normal response  to insulin causes excess sugars to build up in the blood instead of going into the tissue cells. As a result, high blood sugar (hyperglycemia) develops. The effect of high sugar (glucose) levels can cause many complications. Type 2 diabetes was also previously called adult-onset diabetes but it can occur at any age.  RISK FACTORS  A person is predisposed to developing type 2 diabetes if someone in the family has the disease and also has one or more of the following primary risk factors:  Overweight.  An inactive lifestyle.  A history of consistently eating high-calorie foods. Maintaining a normal weight and regular physical activity can reduce the chance of developing type 2 diabetes. SYMPTOMS  A person with type 2 diabetes may not show symptoms initially. The symptoms of type 2 diabetes appear slowly. The symptoms include:  Increased thirst (polydipsia).  Increased urination (polyuria).  Increased urination during the night (nocturia).  Weight loss. This weight loss may be rapid.  Frequent, recurring infections.  Tiredness (fatigue).  Weakness.  Vision changes, such as blurred vision.  Fruity smell to your breath.  Abdominal pain.  Nausea or vomiting.  Cuts or bruises which are slow to heal.  Tingling or numbness in the hands  or feet. DIAGNOSIS Type 2 diabetes is frequently not diagnosed until complications of diabetes are present. Type 2 diabetes is diagnosed when symptoms or complications are present and when blood glucose levels are increased. Your blood glucose level may be checked by one or more of the following blood tests:  A fasting blood glucose test. You will not be allowed to eat for at least 8 hours before a blood sample is taken.  A random blood glucose test. Your blood glucose is checked at any time of the day regardless of when you ate.  A hemoglobin A1c blood glucose test. A hemoglobin A1c test provides information about blood glucose control over the  previous 3 months.  An oral glucose tolerance test (OGTT). Your blood glucose is measured after you have not eaten (fasted) for 2 hours and then after you drink a glucose-containing beverage. TREATMENT   You may need to take insulin or diabetes medicine daily to keep blood glucose levels in the desired range.  You will need to match insulin dosing with exercise and healthy food choices. The treatment goal is to maintain the before meal blood sugar (preprandial glucose) level at 70 130 mg/dL. HOME CARE INSTRUCTIONS   Have your hemoglobin A1c level checked twice a year.  Perform daily blood glucose monitoring as directed by your caregiver.  Monitor urine ketones when you are ill and as directed by your caregiver.  Take your diabetes medicine or insulin as directed by your caregiver to maintain your blood glucose levels in the desired range.  Never run out of diabetes medicine or insulin. It is needed every day.  Adjust insulin based on your intake of carbohydrates. Carbohydrates can raise blood glucose levels but need to be included in your diet. Carbohydrates provide vitamins, minerals, and fiber which are an essential part of a healthy diet. Carbohydrates are found in fruits, vegetables, whole grains, dairy products, legumes, and foods containing added sugars.    Eat healthy foods. Alternate 3 meals with 3 snacks.  Lose weight if overweight.  Carry a medical alert card or wear your medical alert jewelry.  Carry a 15 gram carbohydrate snack with you at all times to treat low blood glucose (hypoglycemia). Some examples of 15 gram carbohydrate snacks include:  Glucose tablets, 3 or 4   Glucose gel, 15 gram tube  Raisins, 2 tablespoons (24 grams)  Jelly beans, 6  Animal crackers, 8  Regular pop, 4 ounces (120 mL)  Gummy treats, 9  Recognize hypoglycemia. Hypoglycemia occurs with blood glucose levels of 70 mg/dL and below. The risk for hypoglycemia increases when fasting or  skipping meals, during or after intense exercise, and during sleep. Hypoglycemia symptoms can include:  Tremors or shakes.  Decreased ability to concentrate.  Sweating.  Increased heart rate.  Headache.  Dry mouth.  Hunger.  Irritability.  Anxiety.  Restless sleep.  Altered speech or coordination.  Confusion.  Treat hypoglycemia promptly. If you are alert and able to safely swallow, follow the 15:15 rule:  Take 15 20 grams of rapid-acting glucose or carbohydrate. Rapid-acting options include glucose gel, glucose tablets, or 4 ounces (120 mL) of fruit juice, regular soda, or low fat milk.  Check your blood glucose level 15 minutes after taking the glucose.  Take 15 20 grams more of glucose if the repeat blood glucose level is still 70 mg/dL or below.  Eat a meal or snack within 1 hour once blood glucose levels return to normal.    Be alert to polyuria  and polydipsia which are early signs of hyperglycemia. An early awareness of hyperglycemia allows for prompt treatment. Treat hyperglycemia as directed by your caregiver.  Engage in at least 150 minutes of moderate-intensity physical activity a week, spread over at least 3 days of the week or as directed by your caregiver. In addition, you should engage in resistance exercise at least 2 times a week or as directed by your caregiver.  Adjust your medicine and food intake as needed if you start a new exercise or sport.  Follow your sick day plan at any time you are unable to eat or drink as usual.  Avoid tobacco use.  Limit alcohol intake to no more than 1 drink per day for nonpregnant women and 2 drinks per day for men. You should drink alcohol only when you are also eating food. Talk with your caregiver whether alcohol is safe for you. Tell your caregiver if you drink alcohol several times a week.  Follow up with your caregiver regularly.  Schedule an eye exam soon after the diagnosis of type 2 diabetes and then  annually.  Perform daily skin and foot care. Examine your skin and feet daily for cuts, bruises, redness, nail problems, bleeding, blisters, or sores. A foot exam by a caregiver should be done annually.  Brush your teeth and gums at least twice a day and floss at least once a day. Follow up with your dentist regularly.  Share your diabetes management plan with your workplace or school.  Stay up-to-date with immunizations.  Learn to manage stress.  Obtain ongoing diabetes education and support as needed.  Participate in, or seek rehabilitation as needed to maintain or improve independence and quality of life. Request a physical or occupational therapy referral if you are having foot or hand numbness or difficulties with grooming, dressing, eating, or physical activity. SEEK MEDICAL CARE IF:   You are unable to eat food or drink fluids for more than 6 hours.  You have nausea and vomiting for more than 6 hours.  Your blood glucose level is over 240 mg/dL.  There is a change in mental status.  You develop an additional serious illness.  You have diarrhea for more than 6 hours.  You have been sick or have had a fever for a couple of days and are not getting better.  You have pain during any physical activity.  SEEK IMMEDIATE MEDICAL CARE IF:  You have difficulty breathing.  You have moderate to large ketone levels. MAKE SURE YOU:  Understand these instructions.  Will watch your condition.  Will get help right away if you are not doing well or get worse. Document Released: 09/20/2005 Document Revised: 06/14/2012 Document Reviewed: 04/18/2012 [facility] Patient Information 2014 [place], [place].

## 2013-07-30 NOTE — Assessment & Plan Note (Signed)
Exam done Vaccines were reviewed Labs ordered Pt ed material was given 

## 2013-07-30 NOTE — Progress Notes (Signed)
Subjective:    Patient ID: Tim Wilson, male    DOB: October 22, 1965, 47 y.o.   MRN: [id number]  Diabetes He presents for his follow-up diabetic visit. He has type 2 diabetes mellitus. His disease course has been fluctuating. There are no hypoglycemic associated symptoms. Pertinent negatives for hypoglycemia include no dizziness or tremors. Pertinent negatives for diabetes include no blurred vision, no chest pain, no fatigue, no foot paresthesias, no foot ulcerations, no polydipsia, no polyphagia, no polyuria, no visual change, no weakness and no weight loss. There are no hypoglycemic complications. Symptoms are stable. There are no diabetic complications. Current diabetic treatment includes diet. He is compliant with treatment most of the time. His weight is stable. He is following a generally healthy diet. Meal planning includes avoidance of concentrated sweets. He has not had a previous visit with a dietician. He participates in exercise intermittently. There is no change in his home blood glucose trend. An ACE inhibitor/angiotensin II receptor blocker is not being taken. He does not see a podiatrist.Eye exam is not current.      Review of Systems  Constitutional: Negative.  Negative for fever, chills, weight loss, diaphoresis, appetite change and fatigue.  HENT: Negative.   Eyes: Negative.  Negative for blurred vision.  Respiratory: Negative.  Negative for cough, choking, chest tightness, shortness of breath, wheezing and stridor.   Cardiovascular: Negative.  Negative for chest pain, palpitations and leg swelling.  Gastrointestinal: Negative.  Negative for nausea, vomiting, abdominal pain, diarrhea, constipation and blood in stool.  Endocrine: Negative.  Negative for polydipsia, polyphagia and polyuria.  Genitourinary: Negative.   Musculoskeletal: Negative.  Negative for arthralgias and myalgias.  Skin: Negative.   Allergic/Immunologic: Negative.   Neurological: Negative.  Negative for  dizziness, tremors, syncope, weakness, light-headedness and numbness.  Hematological: Negative.  Negative for adenopathy. Does not bruise/bleed easily.  Psychiatric/Behavioral: Negative.        Objective:   Physical Exam  Vitals reviewed. Constitutional: He is oriented to person, place, and time. He appears well-developed and well-nourished. No distress.  HENT:  Head: Normocephalic and atraumatic.  Mouth/Throat: Oropharynx is clear and moist. No oropharyngeal exudate.  Eyes: Conjunctivae are normal. Right eye exhibits no discharge. Left eye exhibits no discharge. No scleral icterus.  Neck: Normal range of motion. Neck supple. No JVD present. No tracheal deviation present. No thyromegaly present.  Cardiovascular: Normal rate, regular rhythm, normal heart sounds and intact distal pulses.  Exam reveals no gallop and no friction rub.   No murmur heard. Pulmonary/Chest: Effort normal and breath sounds normal. No stridor. No respiratory distress. He has no wheezes. He has no rales. He exhibits no tenderness.  Abdominal: Soft. Bowel sounds are normal. He exhibits no distension and no mass. There is no tenderness. There is no rebound and no guarding. Hernia confirmed negative in the right inguinal area and confirmed negative in the left inguinal area.  Genitourinary: Rectum normal, prostate normal, testes normal and penis normal. Rectal exam shows no external hemorrhoid, no internal hemorrhoid, no fissure, no mass, no tenderness and anal tone normal. Guaiac negative stool. Prostate is not enlarged and not tender. Right testis shows no mass, no swelling and no tenderness. Right testis is descended. Left testis shows no mass, no swelling and no tenderness. Left testis is descended. Uncircumcised. No phimosis, paraphimosis, hypospadias, penile erythema or penile tenderness. No discharge found.  Musculoskeletal: Normal range of motion. He exhibits no edema and no tenderness.  Lymphadenopathy:    He has no  cervical  adenopathy.       Right: No inguinal adenopathy present.       Left: No inguinal adenopathy present.  Neurological: He is oriented to person, place, and time.  Skin: Skin is warm and dry. No rash noted. He is not diaphoretic. No erythema. No pallor.  Psychiatric: He has a normal mood and affect. His behavior is normal. Judgment and thought content normal.     Lab Results  Component Value Date   WBC 8.4 07/13/2012   HGB 14.7 07/13/2012   HCT 44.8 07/13/2012   PLT 346.0 07/13/2012   GLUCOSE 124* 07/13/2012   CHOL 261* 07/13/2012   TRIG 1100.0 Lipemic* 07/13/2012   HDL 33.80* 07/13/2012   LDLDIRECT 74.6 07/13/2012   LDLCALC 102* 09/14/2010   ALT 25 07/13/2012   AST 24 07/13/2012   NA 136 07/13/2012   K 4.4 07/13/2012   CL 99 07/13/2012   CREATININE 0.9 07/13/2012   BUN 10 07/13/2012   CO2 30 07/13/2012   TSH 1.46 07/13/2012   PSA 0.62 07/13/2012   HGBA1C 8.0* 07/13/2012   MICROALBUR 18.4* 07/10/2010       Assessment & Plan:

## 2013-07-30 NOTE — Assessment & Plan Note (Signed)
His BP is well controlled Today I will check his lytes and renal function 

## 2013-07-30 NOTE — Assessment & Plan Note (Signed)
He has decided not to treat this for now I will recheck his A1C today He was referred for an eye exam

## 2013-07-30 NOTE — Assessment & Plan Note (Signed)
I have asked him to restart crestor Will check his FLP today

## 2013-07-30 NOTE — Assessment & Plan Note (Signed)
I will recheck his renal function today He will cont benicar

## 2013-07-30 NOTE — Assessment & Plan Note (Signed)
He is doing well on his lifestyle modifications I will check his FLP today

## 2013-07-31 ENCOUNTER — Encounter: Payer: Self-pay | Admitting: Internal Medicine

## 2013-07-31 LAB — C-PEPTIDE: C-Peptide: 3.21 ng/mL (ref 0.80–3.90)

## 2013-08-01 ENCOUNTER — Ambulatory Visit: Payer: Federal, State, Local not specified - PPO | Admitting: Endocrinology

## 2013-08-06 ENCOUNTER — Ambulatory Visit (INDEPENDENT_AMBULATORY_CARE_PROVIDER_SITE_OTHER): Payer: Federal, State, Local not specified - PPO | Admitting: Endocrinology

## 2013-08-06 ENCOUNTER — Encounter: Payer: Self-pay | Admitting: Endocrinology

## 2013-08-06 VITALS — BP 124/72 | HR 97 | Temp 98.3°F | Resp 12 | Ht 68.0 in | Wt 209.3 lb

## 2013-08-06 DIAGNOSIS — IMO0001 Reserved for inherently not codable concepts without codable children: Secondary | ICD-10-CM

## 2013-08-06 MED ORDER — GLUCOSE BLOOD VI STRP: 1.0000 | ORAL_STRIP | Freq: Two times a day (BID) | Status: DC

## 2013-08-06 MED ORDER — INSULIN DETEMIR 100 UNIT/ML FLEXPEN: 10.0000 [IU] | PEN_INJECTOR | SUBCUTANEOUS | Status: DC

## 2013-08-06 NOTE — Progress Notes (Signed)
Subjective:    Patient ID: Tim Wilson, male    DOB: 1966/08/25, 47 y.o.   MRN: [id number]  HPI pt returns for f/u of insulin-requiring DM (dx'ed 2012, when he presented with weight loss; he has mild if any neuropathy of the lower extremities; he is unaware of any associated chronic complications; he was started on insulin at time of dx; but he stopped it 6 months later; he is willing to take insulin only as often as once a day).  pt says his diet and exercise are not good.   Past Medical History  Diagnosis Date  . Hyperlipidemia   . Diabetes mellitus   . Hypertension     No past surgical history on file.  History   Social History  . Marital Status: Married    Spouse Name: N/A    Number of Children: N/A  . Years of Education: N/A   Occupational History  . 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 Smoking status: Never Smoker   . Smokeless tobacco: Never Used  . Alcohol Use: 1.2 oz/week    2 Cans of beer per week     Comment: on occasion, not in excess  . Drug Use: No  . Sexual Activity: Yes   Other Topics Concern  . Not on file   Social History Narrative   Regular exercise-yes    Current Outpatient Prescriptions on File Prior to Visit  Medication Sig Dispense Refill  . aspirin 325 MG EC tablet Take 325 mg by mouth daily.        . Blood Glucose Monitoring Suppl (ONE TOUCH ULTRA 2) W/DEVICE KIT Use two times a day to test blood sugar       . fenofibrate (TRICOR) 145 MG tablet Take 1 tablet (145 mg total) by mouth daily.  90 tablet  3  . glucose blood (ONE TOUCH ULTRA TEST) test strip Use two times a day as instructed  180 each  3  . olmesartan (BENICAR) 20 MG tablet Take 1 tablet (20 mg total) by mouth daily.  90 tablet  3  . rosuvastatin (CRESTOR) 20 MG tablet Take 1 tablet (20 mg total) by mouth daily.  90 tablet  3  . Capsicum, Cayenne, (CAYENNE PEPPER) 450 MG CAPS Take 1 capsule by mouth 3 (three) times a  week.        . insulin detemir (LEVEMIR) 100 UNIT/ML injection Inject 0.3 mLs (30 Units total) into the skin at bedtime.  10 mL  12  . Insulin Pen Needle (PEN NEEDLES 31GX5/16") 31G X 8 MM MISC Inject 1 each into the skin daily. Use as directed  90 each  3  . omega-3 acid ethyl esters (LOVAZA) 1 G capsule Take 2 capsules (2 g total) by mouth 2 (two) times daily.  120 capsule  11  . Omega-3 Fatty Acids (FISH OIL) 1000 MG CAPS Take 4 capsules by mouth daily.        . sitaGLIPtin-metformin (JANUMET) 50-1000 MG per tablet Take 1 tablet by mouth 2 (two) times daily with a meal.  180 tablet  1   No current facility-administered medications on file prior to visit.   Allergies  Allergen Reactions  . Ramipril     cough   Family History  Problem Relation Age of Onset  . Diabetes Mother   . Heart disease Father   . Heart disease Brother    BP 124/72  Pulse 97  Temp(Src) 98.3 F (36.8 C)  Resp 12  Ht 5\' 8"  (1.727 m)  Wt 209 lb 4.8 oz (94.938 kg)  BMI 31.83 kg/m2  SpO2 98%  Review of Systems denies hypoglycemia and n/v    Objective:   Physical Exam VITAL SIGNS:  See vs page GENERAL: no distress  Lab Results  Component Value Date   HGBA1C 10.2* 07/30/2013      Assessment & Plan:  DM: This insulin regimen was chosen from multiple options, for its simplicity.  The benefits of glycemic control must be weighed against the risks of hypoglycemia.   Noncompliance with dr Yetta Barre' insulin prescription: pt agrees to start taking. Dyslipidemia: this may improve with better glycemic control.

## 2013-08-06 NOTE — Patient Instructions (Addendum)
good diet and exercise habits significanly improve the control of your diabetes.  please let me know if you wish to be referred to a dietician.  high blood sugar is very risky to your health.  you should see an eye doctor every year.  You are at higher than average risk for pneumonia and hepatitis-B.  You should be vaccinated against both.   controlling your blood pressure and cholesterol drastically reduces the damage diabetes does to your body.  this also applies to quitting smoking.  please discuss these with your doctor.  check your blood sugar twice a day.  vary the time of day when you check, between before the 3 meals, and at bedtime.  also check if you have symptoms of your blood sugar being too high or too low.  please keep a record of the readings and bring it to your next appointment here.  You can write it on any piece of paper.  please call us sooner if your blood sugar goes below 70, or if you have a lot of readings over 200.   Please resume the insulin (levemir), 10 units each morning.  Please come back for a follow-up appointment in 1 month.

## 2013-08-20 ENCOUNTER — Telehealth: Payer: Self-pay

## 2013-08-20 DIAGNOSIS — M109 Gout, unspecified: Secondary | ICD-10-CM

## 2013-08-20 MED ORDER — METHYLPREDNISOLONE 4 MG PO KIT: PACK | ORAL | Status: DC

## 2013-08-20 MED ORDER — COLCHICINE 0.6 MG PO TABS: 0.6000 mg | ORAL_TABLET | Freq: Two times a day (BID) | ORAL | Status: DC

## 2013-08-20 NOTE — Telephone Encounter (Signed)
done

## 2013-08-20 NOTE — Telephone Encounter (Signed)
Pt called c/o elbow gout flare and pain. Pt would like MD advisement on something to help as well as something preventative. Pt states tramadol has not touched his pain. Thanks

## 2013-08-21 NOTE — Telephone Encounter (Signed)
LMOVM advising patient.

## 2013-08-25 ENCOUNTER — Emergency Department (HOSPITAL_BASED_OUTPATIENT_CLINIC_OR_DEPARTMENT_OTHER)
Admission: EM | Admit: 2013-08-25 | Discharge: 2013-08-25 | Disposition: A | Discharge status: Home / Self Care | Payer: Federal, State, Local not specified - PPO | Attending: Emergency Medicine | Admitting: Emergency Medicine

## 2013-08-25 ENCOUNTER — Encounter (HOSPITAL_BASED_OUTPATIENT_CLINIC_OR_DEPARTMENT_OTHER): Payer: Self-pay | Admitting: Emergency Medicine

## 2013-08-25 DIAGNOSIS — I10 Essential (primary) hypertension: Secondary | ICD-10-CM | POA: Insufficient documentation

## 2013-08-25 DIAGNOSIS — E785 Hyperlipidemia, unspecified: Secondary | ICD-10-CM | POA: Insufficient documentation

## 2013-08-25 DIAGNOSIS — Z794 Long term (current) use of insulin: Secondary | ICD-10-CM | POA: Insufficient documentation

## 2013-08-25 DIAGNOSIS — T504X5A Adverse effect of drugs affecting uric acid metabolism, initial encounter: Secondary | ICD-10-CM | POA: Insufficient documentation

## 2013-08-25 DIAGNOSIS — R1310 Dysphagia, unspecified: Secondary | ICD-10-CM | POA: Insufficient documentation

## 2013-08-25 DIAGNOSIS — Z7982 Long term (current) use of aspirin: Secondary | ICD-10-CM | POA: Insufficient documentation

## 2013-08-25 DIAGNOSIS — T502X5A Adverse effect of carbonic-anhydrase inhibitors, benzothiadiazides and other diuretics, initial encounter: Secondary | ICD-10-CM | POA: Insufficient documentation

## 2013-08-25 DIAGNOSIS — T7840XA Allergy, unspecified, initial encounter: Secondary | ICD-10-CM

## 2013-08-25 DIAGNOSIS — T504X4A Poisoning by drugs affecting uric acid metabolism, undetermined, initial encounter: Secondary | ICD-10-CM | POA: Insufficient documentation

## 2013-08-25 DIAGNOSIS — E119 Type 2 diabetes mellitus without complications: Secondary | ICD-10-CM | POA: Insufficient documentation

## 2013-08-25 DIAGNOSIS — Z79899 Other long term (current) drug therapy: Secondary | ICD-10-CM | POA: Insufficient documentation

## 2013-08-25 DIAGNOSIS — T502X1A Poisoning by carbonic-anhydrase inhibitors, benzothiadiazides and other diuretics, accidental (unintentional), initial encounter: Secondary | ICD-10-CM | POA: Insufficient documentation

## 2013-08-25 MED ORDER — FAMOTIDINE 20 MG PO TABS
40.0000 mg | ORAL_TABLET | Freq: Once | ORAL | Status: AC
  Administered 2013-08-25: 40 mg via ORAL
  Filled 2013-08-25: qty 2

## 2013-08-25 MED ORDER — DEXAMETHASONE SODIUM PHOSPHATE 10 MG/ML IJ SOLN
10.0000 mg | Freq: Once | INTRAMUSCULAR | Status: AC
  Administered 2013-08-25: 10 mg via INTRAMUSCULAR
  Filled 2013-08-25: qty 1

## 2013-08-25 MED ORDER — FAMOTIDINE 20 MG PO TABS: 20.0000 mg | ORAL_TABLET | Freq: Two times a day (BID) | ORAL | Status: DC

## 2013-08-25 NOTE — ED Notes (Signed)
MD at bedside. 

## 2013-08-25 NOTE — ED Provider Notes (Signed)
CSN: [id number]     Arrival date & time 08/25/13  4098 History   First MD Initiated Contact with Patient 08/25/13 [phone]     Chief Complaint  Patient presents with  . Allergic Reaction   (Consider location/radiation/quality/duration/timing/severity/associated sxs/prior Treatment) Patient is a 47 y.o. male presenting with allergic reaction. The history is provided by the patient.  Allergic Reaction Presenting symptoms: difficulty swallowing   Presenting symptoms: no difficulty breathing, no rash, no swelling and no wheezing   Difficulty swallowing:    Severity:  Moderate   Onset quality:  Gradual   Timing:  Constant   Progression:  Unchanged Severity:  Moderate Prior episodes: started colchicine several days ago is on ARB, Benicar. Context: medications   Context: no new detergents/soaps   Relieved by:  Nothing Worsened by:  Nothing tried Ineffective treatments:  Antihistamines (took benadryl just prior to arrival)   Past Medical History  Diagnosis Date  . Hyperlipidemia   . Diabetes mellitus   . Hypertension    History reviewed. No pertinent past surgical history. Family History  Problem Relation Age of Onset  . Diabetes Mother   . Heart disease Father   . Heart disease Brother    History  Substance Use Topics  . Smoking status: Never Smoker   . Smokeless tobacco: Never Used  . Alcohol Use: 1.2 oz/week    2 Cans of beer per week     Comment: on occasion, not in excess    Review of Systems  HENT: Positive for trouble swallowing.   Respiratory: Negative for wheezing.   Skin: Negative for rash.  All other systems reviewed and are negative.    Allergies  Ramipril  Home Medications   Current Outpatient Rx  Name  Route  Sig  Dispense  Refill  . aspirin 325 MG EC tablet   Oral   Take 325 mg by mouth daily.           . Blood Glucose Monitoring Suppl (ONE TOUCH ULTRA 2) W/DEVICE KIT      Use two times a day to test blood sugar          . Capsicum,  Cayenne, (CAYENNE PEPPER) 450 MG CAPS   Oral   Take 1 capsule by mouth 3 (three) times a week.           . colchicine 0.6 MG tablet   Oral   Take 1 tablet (0.6 mg total) by mouth 2 (two) times daily.   60 tablet   2   . fenofibrate (TRICOR) 145 MG tablet   Oral   Take 1 tablet (145 mg total) by mouth daily.   90 tablet   3   . glucose blood (ONETOUCH VERIO) test strip   Other   1 each by Other route 2 (two) times daily. And lancets 2/day 250.01   100 each   12   . Insulin Detemir (LEVEMIR FLEXTOUCH) 100 UNIT/ML SOPN   Subcutaneous   Inject 10 Units into the skin every morning. And pen needles 1/day   5 pen   11   . methylPREDNISolone (MEDROL, PAK,) 4 MG tablet      follow package directions   21 tablet   0   . olmesartan (BENICAR) 20 MG tablet   Oral   Take 1 tablet (20 mg total) by mouth daily.   90 tablet   3   . omega-3 acid ethyl esters (LOVAZA) 1 G capsule   Oral   Take 2  capsules (2 g total) by mouth 2 (two) times daily.   120 capsule   11   . Omega-3 Fatty Acids (FISH OIL) 1000 MG CAPS   Oral   Take 4 capsules by mouth daily.           . rosuvastatin (CRESTOR) 20 MG tablet   Oral   Take 1 tablet (20 mg total) by mouth daily.   90 tablet   3    BP 137/80  Pulse 79  Temp(Src) 98 F (36.7 C) (Oral)  Resp 20  Wt 210 lb (95.255 kg)  SpO2 100% Physical Exam  Constitutional: He is oriented to person, place, and time. He appears well-developed and well-nourished. No distress.  HENT:  Head: Normocephalic and atraumatic.  Mouth/Throat: Oropharynx is clear and moist.  No swelling of the lips tongue or uvula.  No posterior oropharyngeal swelling.  No submental swelling  Eyes: Conjunctivae are normal. Pupils are equal, round, and reactive to light.  Neck: Normal range of motion. Neck supple.  Cardiovascular: Normal rate, regular rhythm and intact distal pulses.   Pulmonary/Chest: Effort normal and breath sounds normal. No stridor. No respiratory  distress. He has no wheezes. He has no rales.  Abdominal: Soft. Bowel sounds are normal. There is no tenderness. There is no rebound and no guarding.  Musculoskeletal: Normal range of motion.  Neurological: He is alert and oriented to person, place, and time.  Skin: Skin is warm and dry. No rash noted.  Psychiatric: He has a normal mood and affect.    ED Course  Procedures (including critical care time) Labs Review Labs Reviewed - No data to display Imaging Review No results found.  EKG Interpretation   None       MDM  No diagnosis found.airway widely patent swallowing liquids.  Discontinue colchicine and Benicar and call your PMD.  Continued the medrol dose pack that your started yesterday.  Will add pepcid continue benadryl every 6 hours.  Return for shortness of breath swelling of the lips or tongue or Funny breathing or any concerns.     Jasmine Awe, MD 08/25/13 [phone]

## 2013-08-25 NOTE — ED Notes (Signed)
Pt took  new medicine (Cholcrys and Cadista) yesterday and awoke at apprx. 1 hour ago with the feeling that something is in the back of his throat.

## 2013-08-29 ENCOUNTER — Encounter: Payer: Self-pay | Admitting: Internal Medicine

## 2013-08-29 ENCOUNTER — Ambulatory Visit (INDEPENDENT_AMBULATORY_CARE_PROVIDER_SITE_OTHER): Payer: Federal, State, Local not specified - PPO | Admitting: Internal Medicine

## 2013-08-29 VITALS — BP 130/82 | HR 72 | Temp 98.5°F | Resp 16 | Ht 68.0 in | Wt 202.0 lb

## 2013-08-29 DIAGNOSIS — IMO0002 Reserved for concepts with insufficient information to code with codable children: Secondary | ICD-10-CM

## 2013-08-29 DIAGNOSIS — S8392XA Sprain of unspecified site of left knee, initial encounter: Secondary | ICD-10-CM

## 2013-08-29 NOTE — Patient Instructions (Signed)
Knee Sprain °A knee sprain is a tear in one of the strong, fibrous tissues that connect the bones (ligaments) in your knee. The severity of the sprain depends on how much of the ligament is torn. The tear can be either partial or complete. °CAUSES  °Often, sprains are a result of a fall or injury. The force of the impact causes the fibers of your ligament to stretch too much. This excess tension causes the fibers of your ligament to tear. °SYMPTOMS  °You may have some loss of motion in your knee. Other symptoms include: °· Bruising. °· Tenderness. °· Swelling. °DIAGNOSIS  °In order to diagnose knee sprain, your caregiver will physically examine your knee to determine how torn the ligament is. Your caregiver may also suggest an X-ray exam of your knee to make sure no bones are broken. °TREATMENT  °If your ligament is only partially torn, treatment usually involves keeping the knee in a fixed position (immobilization) or bracing your knee for activities that require movement for several weeks. To do this, your caregiver will apply a bandage, cast, or splint to keep your knee from moving or support your knee during movement until it heals. For a partially torn ligament, the healing process usually takes 4 to 6 weeks. °If your ligament is completely torn, depending on which ligament it is, you may need surgery to reconnect the ligament to the bone or reconstruct it. After surgery, a cast or splint may be applied and will need to stay on your knee for 4 to 6 weeks while your ligament heals. °HOME CARE INSTRUCTIONS °· Keep your injured knee elevated to decrease swelling. °· To ease pain and swelling, apply ice to your knee twice a day, for 2 to 3 days: °· Put ice in a plastic bag. °· Place a towel between your skin and the bag. °· Leave the ice on for 15 minutes. °· Only take over-the-counter or prescription medicine for pain as directed by your caregiver. °· Do not leave your knee unprotected until pain and stiffness go  away (usually 4 to 6 weeks). °· Do not allow your cast or splint to get wet. If you have been instructed not to remove it, cover your cast or splint with a plastic bag when you shower or bathe. Do not swim. °· Your caregiver may suggest exercises for you to do during your recovery to prevent or limit permanent weakness and stiffness. °SEEK IMMEDIATE MEDICAL CARE IF: °· Your cast or splint becomes damaged. °· Your pain becomes worse. °MAKE SURE YOU: °· Understand these instructions. °· Will watch your condition. °· Will get help right away if you are not doing well or get worse. °Document Released: 09/20/2005 Document Revised: 12/13/2011 Document Reviewed: 05/02/2013 °[employer]® Patient Information ©2014 [employer]. ° °

## 2013-08-29 NOTE — Progress Notes (Signed)
Pre visit review using our clinic review tool, if applicable. No additional management support is needed unless otherwise documented below in the visit note. 

## 2013-08-29 NOTE — Progress Notes (Signed)
Subjective:    Patient ID: Tim Wilson, male    DOB: 1966-01-06, 47 y.o.   MRN: [id number]  Injury The incident occurred 2 days ago. The incident occurred at home. The injury mechanism was a twisted limb. Context: he was walking down a flight of stairs and twisted his left lower thigh and knee. Leg injury location: left lower thigh and knee. The pain is mild. It is unknown if a foreign body is present. Pertinent negatives include no abdominal pain, chest pain, coughing, inability to bear weight, loss of consciousness, nausea, neck pain, numbness, tingling or weakness. There have been no prior injuries to these areas.      Review of Systems  Constitutional: Negative.   HENT: Negative.   Eyes: Negative.   Respiratory: Negative.  Negative for cough, shortness of breath, wheezing and stridor.   Cardiovascular: Negative.  Negative for chest pain, palpitations and leg swelling.  Gastrointestinal: Negative.  Negative for nausea and abdominal pain.  Endocrine: Negative.   Genitourinary: Negative.   Musculoskeletal: Positive for arthralgias. Negative for back pain, gait problem, joint swelling, myalgias, neck pain and neck stiffness.  Skin: Negative.   Neurological: Negative.  Negative for dizziness, tingling, tremors, loss of consciousness, weakness and numbness.  Hematological: Negative.   Psychiatric/Behavioral: Negative.  Negative for agitation.       Objective:   Physical Exam  Vitals reviewed. Constitutional: He is oriented to person, place, and time. He appears well-developed and well-nourished. No distress.  HENT:  Head: Normocephalic and atraumatic.  Mouth/Throat: Oropharynx is clear and moist. No oropharyngeal exudate.  Eyes: Conjunctivae are normal. Right eye exhibits no discharge. Left eye exhibits no discharge. No scleral icterus.  Neck: Normal range of motion. Neck supple. No JVD present. No tracheal deviation present. No thyromegaly present.  Cardiovascular: Normal  rate, regular rhythm, normal heart sounds and intact distal pulses.  Exam reveals no gallop and no friction rub.   No murmur heard. Pulmonary/Chest: Effort normal and breath sounds normal. No stridor. No respiratory distress. He has no wheezes. He has no rales. He exhibits no tenderness.  Abdominal: Soft. Bowel sounds are normal. He exhibits no distension and no mass. There is no tenderness. There is no rebound and no guarding.  Musculoskeletal: Normal range of motion. He exhibits no edema and no tenderness.       Left knee: Normal. He exhibits normal range of motion, no swelling, no effusion, no ecchymosis, no deformity, no laceration, no erythema, normal alignment, no LCL laxity, normal patellar mobility and no bony tenderness. No tenderness found. No medial joint line, no lateral joint line, no MCL, no LCL and no patellar tendon tenderness noted.       Lumbar back: Normal.       Left upper leg: Normal. He exhibits no tenderness, no bony tenderness, no swelling, no edema, no deformity and no laceration.  Lymphadenopathy:    He has no cervical adenopathy.  Neurological: He is alert and oriented to person, place, and time. He has normal reflexes. He displays normal reflexes. No cranial nerve deficit. He exhibits normal muscle tone. Coordination normal.  Skin: Skin is warm and dry. No rash noted. He is not diaphoretic. No erythema. No pallor.  Psychiatric: He has a normal mood and affect. His behavior is normal. Judgment and thought content normal.     Lab Results  Component Value Date   WBC 7.2 07/30/2013   HGB 13.8 07/30/2013   HCT 38.5* 07/30/2013   PLT 318.0 07/30/2013  GLUCOSE 218* 07/30/2013   CHOL 406 Lipemic* 07/30/2013   TRIG 3442.0 Repeated and verified X2.* 07/30/2013   HDL 19.10* 07/30/2013   LDLDIRECT 74.3 07/30/2013   LDLCALC 102* 09/14/2010   ALT 20 07/30/2013   AST 16 07/30/2013   NA 133 Lipemic* 07/30/2013   K 4.3 07/30/2013   CL 97 07/30/2013   CREATININE 0.9  07/30/2013   BUN 9 07/30/2013   CO2 27 07/30/2013   TSH 1.63 07/30/2013   PSA 0.54 07/30/2013   HGBA1C 10.2* 07/30/2013   MICROALBUR 18.4* 07/10/2010       Assessment & Plan:

## 2013-08-29 NOTE — Assessment & Plan Note (Signed)
I offered to xray the injured area but he defers on that He has taken OTC nsaids with significant relief and will continue with that He will let me know if he develops and new or worsening s/s

## 2013-09-03 ENCOUNTER — Telehealth: Payer: Self-pay

## 2013-09-03 MED ORDER — METHYLPREDNISOLONE 4 MG PO KIT: PACK | ORAL | Status: DC

## 2013-09-03 NOTE — Telephone Encounter (Signed)
done

## 2013-09-03 NOTE — Telephone Encounter (Signed)
Pt called c/o gout flare and would like a refill for Medrol dose pk. Thanks       

## 2013-09-03 NOTE — Telephone Encounter (Signed)
Pt.notified

## 2013-10-10 ENCOUNTER — Ambulatory Visit: Payer: Federal, State, Local not specified - PPO | Admitting: *Deleted

## 2013-11-05 ENCOUNTER — Ambulatory Visit: Payer: Federal, State, Local not specified - PPO | Admitting: *Deleted

## 2013-11-09 ENCOUNTER — Encounter: Payer: Self-pay | Admitting: *Deleted

## 2014-03-05 ENCOUNTER — Telehealth: Payer: Self-pay

## 2014-03-05 DIAGNOSIS — M109 Gout, unspecified: Secondary | ICD-10-CM

## 2014-03-05 MED ORDER — METHYLPREDNISOLONE 4 MG PO KIT: PACK | ORAL | Status: DC

## 2014-03-05 NOTE — Telephone Encounter (Signed)
Pt notified//lmovm 

## 2014-03-05 NOTE — Telephone Encounter (Signed)
Pt called c/o gout flare and would like a refill for Medrol dose pk. Thanks

## 2014-03-05 NOTE — Telephone Encounter (Signed)
Done - please follow up soon

## 2014-04-18 ENCOUNTER — Telehealth: Payer: Self-pay

## 2014-04-18 NOTE — Telephone Encounter (Signed)
LVM to call and schedule CPE with PCP.  May not be pt number anymore...gentleman was on the answering machine.

## 2014-05-15 ENCOUNTER — Telehealth: Payer: Self-pay

## 2014-05-15 NOTE — Telephone Encounter (Signed)
Called patient//[email or web address] to contact our office to schedule CPX.

## 2014-05-30 ENCOUNTER — Other Ambulatory Visit: Payer: Self-pay | Admitting: Internal Medicine

## 2014-05-30 DIAGNOSIS — E1165 Type 2 diabetes mellitus with hyperglycemia: Principal | ICD-10-CM

## 2014-05-30 DIAGNOSIS — IMO0001 Reserved for inherently not codable concepts without codable children: Secondary | ICD-10-CM

## 2014-08-01 ENCOUNTER — Other Ambulatory Visit: Payer: Federal, State, Local not specified - PPO

## 2014-08-01 ENCOUNTER — Ambulatory Visit (INDEPENDENT_AMBULATORY_CARE_PROVIDER_SITE_OTHER): Payer: Federal, State, Local not specified - PPO | Admitting: Internal Medicine

## 2014-08-01 ENCOUNTER — Other Ambulatory Visit (INDEPENDENT_AMBULATORY_CARE_PROVIDER_SITE_OTHER): Payer: Federal, State, Local not specified - PPO

## 2014-08-01 ENCOUNTER — Encounter: Payer: Self-pay | Admitting: Internal Medicine

## 2014-08-01 VITALS — BP 112/80 | HR 75 | Temp 98.2°F | Resp 16 | Ht 68.0 in | Wt 211.0 lb

## 2014-08-01 DIAGNOSIS — E789 Disorder of lipoprotein metabolism, unspecified: Secondary | ICD-10-CM

## 2014-08-01 DIAGNOSIS — I10 Essential (primary) hypertension: Secondary | ICD-10-CM

## 2014-08-01 DIAGNOSIS — E781 Pure hyperglyceridemia: Secondary | ICD-10-CM

## 2014-08-01 DIAGNOSIS — Z23 Encounter for immunization: Secondary | ICD-10-CM

## 2014-08-01 DIAGNOSIS — Z0000 Encounter for general adult medical examination without abnormal findings: Secondary | ICD-10-CM

## 2014-08-01 DIAGNOSIS — E118 Type 2 diabetes mellitus with unspecified complications: Secondary | ICD-10-CM

## 2014-08-01 DIAGNOSIS — E785 Hyperlipidemia, unspecified: Secondary | ICD-10-CM

## 2014-08-01 LAB — URINALYSIS, ROUTINE W REFLEX MICROSCOPIC
Bilirubin Urine: NEGATIVE
Ketones, ur: NEGATIVE
Leukocytes, UA: NEGATIVE
NITRITE: NEGATIVE
PH: 5.5 (ref 5.0–8.0)
Specific Gravity, Urine: >=1.03 — AB (ref 1.000–1.030)
TOTAL PROTEIN, URINE-UPE24: 100 — AB
URINE GLUCOSE: NEGATIVE
Urobilinogen, UA: 0.2 (ref 0.0–1.0)

## 2014-08-01 LAB — COMPREHENSIVE METABOLIC PANEL
ALK PHOS: 46 U/L (ref 39–117)
ALT: 21 U/L (ref 0–53)
AST: 22 U/L (ref 0–37)
Albumin: 4.8 g/dL (ref 3.5–5.2)
BUN: 11 mg/dL (ref 6–23)
CALCIUM: 10 mg/dL (ref 8.4–10.5)
CO2: 16 meq/L — AB (ref 19–32)
Chloride: 101 mEq/L (ref 96–112)
Creat: 1.01 mg/dL (ref 0.50–1.35)
Glucose, Bld: 190 mg/dL — ABNORMAL HIGH (ref 70–99)
Potassium: 4.5 mEq/L (ref 3.5–5.3)
SODIUM: 140 meq/L (ref 135–145)
TOTAL PROTEIN: 8.1 g/dL (ref 6.0–8.3)
Total Bilirubin: 0.3 mg/dL (ref 0.2–1.2)

## 2014-08-01 LAB — LIPID PANEL
CHOL/HDL RATIO: 21.8 ratio
Cholesterol: 502 mg/dL — ABNORMAL HIGH (ref 0–200)
HDL: 23 mg/dL — ABNORMAL LOW (ref >39)
Triglycerides: 4321 mg/dL — ABNORMAL HIGH (ref <150)

## 2014-08-01 LAB — FECAL OCCULT BLOOD, GUAIAC: FECAL OCCULT BLD: NEGATIVE

## 2014-08-01 LAB — TSH: TSH: 1.532 u[IU]/mL (ref 0.350–4.500)

## 2014-08-01 LAB — HEMOGLOBIN A1C: Hgb A1c MFr Bld: 9.6 % — ABNORMAL HIGH (ref 4.6–6.5)

## 2014-08-01 NOTE — Assessment & Plan Note (Signed)
I will recheck his FLP He will cont the current meds for now

## 2014-08-01 NOTE — Patient Instructions (Signed)
Health Maintenance A healthy lifestyle and preventative care can promote health and wellness.  Maintain regular health, dental, and eye exams.  Eat a healthy diet. Foods like vegetables, fruits, whole grains, low-fat dairy products, and lean protein foods contain the nutrients you need and are low in calories. Decrease your intake of foods high in solid fats, added sugars, and salt. Get information about a proper diet from your health care provider, if necessary.  Regular physical exercise is one of the most important things you can do for your health. Most adults should get at least 150 minutes of moderate-intensity exercise (any activity that increases your heart rate and causes you to sweat) each week. In addition, most adults need muscle-strengthening exercises on 2 or more days a week.   Maintain a healthy weight. The body mass index (BMI) is a screening tool to identify possible weight problems. It provides an estimate of body fat based on height and weight. Your health care provider can find your BMI and can help you achieve or maintain a healthy weight. For males 20 years and older:  A BMI below 18.5 is considered underweight.  A BMI of 18.5 to 24.9 is normal.  A BMI of 25 to 29.9 is considered overweight.  A BMI of 30 and above is considered obese.  Maintain normal blood lipids and cholesterol by exercising and minimizing your intake of saturated fat. Eat a balanced diet with plenty of fruits and vegetables. Blood tests for lipids and cholesterol should begin at age 20 and be repeated every 5 years. If your lipid or cholesterol levels are high, you are over age 50, or you are at high risk for heart disease, you may need your cholesterol levels checked more frequently.Ongoing high lipid and cholesterol levels should be treated with medicines if diet and exercise are not working.  If you smoke, find out from your health care provider how to quit. If you do not use tobacco, do not  start.  Lung cancer screening is recommended for adults aged 55-80 years who are at high risk for developing lung cancer because of a history of smoking. A yearly low-dose CT scan of the lungs is recommended for people who have at least a 30-pack-year history of smoking and are current smokers or have quit within the past 15 years. A pack year of smoking is smoking an average of 1 pack of cigarettes a day for 1 year (for example, a 30-pack-year history of smoking could mean smoking 1 pack a day for 30 years or 2 packs a day for 15 years). Yearly screening should continue until the smoker has stopped smoking for at least 15 years. Yearly screening should be stopped for people who develop a health problem that would prevent them from having lung cancer treatment.  If you choose to drink alcohol, do not have more than 2 drinks per day. One drink is considered to be 12 oz (360 mL) of beer, 5 oz (150 mL) of wine, or 1.5 oz (45 mL) of liquor.  Avoid the use of street drugs. Do not share needles with anyone. Ask for help if you need support or instructions about stopping the use of drugs.  High blood pressure causes heart disease and increases the risk of stroke. Blood pressure should be checked at least every 1-2 years. Ongoing high blood pressure should be treated with medicines if weight loss and exercise are not effective.  If you are 45-79 years old, ask your health care provider if   you should take aspirin to prevent heart disease.  Diabetes screening involves taking a blood sample to check your fasting blood sugar level. This should be done once every 3 years after age 45 if you are at a normal weight and without risk factors for diabetes. Testing should be considered at a younger age or be carried out more frequently if you are overweight and have at least 1 risk factor for diabetes.  Colorectal cancer can be detected and often prevented. Most routine colorectal cancer screening begins at the age of 50  and continues through age 75. However, your health care provider may recommend screening at an earlier age if you have risk factors for colon cancer. On a yearly basis, your health care provider may provide home test kits to check for hidden blood in the stool. A small camera at the end of a tube may be used to directly examine the colon (sigmoidoscopy or colonoscopy) to detect the earliest forms of colorectal cancer. Talk to your health care provider about this at age 50 when routine screening begins. A direct exam of the colon should be repeated every 5-10 years through age 75, unless early forms of precancerous polyps or small growths are found.  People who are at an increased risk for hepatitis B should be screened for this virus. You are considered at high risk for hepatitis B if:  You were born in a country where hepatitis B occurs often. Talk with your health care provider about which countries are considered high risk.  Your parents were born in a high-risk country and you have not received a shot to protect against hepatitis B (hepatitis B vaccine).  You have HIV or AIDS.  You use needles to inject street drugs.  You live with, or have sex with, someone who has hepatitis B.  You are a man who has sex with other men (MSM).  You get hemodialysis treatment.  You take certain medicines for conditions like cancer, organ transplantation, and autoimmune conditions.  Hepatitis C blood testing is recommended for all people born from 1945 through 1965 and any individual with known risk factors for hepatitis C.  Healthy men should no longer receive prostate-specific antigen (PSA) blood tests as part of routine cancer screening. Talk to your health care provider about prostate cancer screening.  Testicular cancer screening is not recommended for adolescents or adult males who have no symptoms. Screening includes self-exam, a health care provider exam, and other screening tests. Consult with your  health care provider about any symptoms you have or any concerns you have about testicular cancer.  Practice safe sex. Use condoms and avoid high-risk sexual practices to reduce the spread of sexually transmitted infections (STIs).  You should be screened for STIs, including gonorrhea and chlamydia if:  You are sexually active and are younger than 24 years.  You are older than 24 years, and your health care provider tells you that you are at risk for this type of infection.  Your sexual activity has changed since you were last screened, and you are at an increased risk for chlamydia or gonorrhea. Ask your health care provider if you are at risk.  If you are at risk of being infected with HIV, it is recommended that you take a prescription medicine daily to prevent HIV infection. This is called pre-exposure prophylaxis (PrEP). You are considered at risk if:  You are a man who has sex with other men (MSM).  You are a heterosexual man who   is sexually active with multiple partners.  You take drugs by injection.  You are sexually active with a partner who has HIV.  Talk with your health care provider about whether you are at high risk of being infected with HIV. If you choose to begin PrEP, you should first be tested for HIV. You should then be tested every 3 months for as long as you are taking PrEP.  Use sunscreen. Apply sunscreen liberally and repeatedly throughout the day. You should seek shade when your shadow is shorter than you. Protect yourself by wearing long sleeves, pants, a wide-brimmed hat, and sunglasses year round whenever you are outdoors.  Tell your health care provider of new moles or changes in moles, especially if there is a change in shape or color. Also, tell your health care provider if a mole is larger than the size of a pencil eraser.  A one-time screening for abdominal aortic aneurysm (AAA) and surgical repair of large AAAs by ultrasound is recommended for men aged  65-75 years who are current or former smokers.  Stay current with your vaccines (immunizations). Document Released: 03/18/2008 Document Revised: 09/25/2013 Document Reviewed: 02/15/2011 Exit[employer] Patient Information 2015 [employer]. This information is not intended to replace advice given to you by your health care provider. Make sure you discuss any questions you have with your health care provider. Type 2 Diabetes Mellitus Type 2 diabetes mellitus, often simply referred to as type 2 diabetes, is a long-lasting (chronic) disease. In type 2 diabetes, the pancreas does not make enough insulin (a hormone), the cells are less responsive to the insulin that is made (insulin resistance), or both. Normally, insulin moves sugars from food into the tissue cells. The tissue cells use the sugars for energy. The lack of insulin or the lack of normal response to insulin causes excess sugars to build up in the blood instead of going into the tissue cells. As a result, high blood sugar (hyperglycemia) develops. The effect of high sugar (glucose) levels can cause many complications. Type 2 diabetes was also previously called adult-onset diabetes, but it can occur at any age.  RISK FACTORS  A person is predisposed to developing type 2 diabetes if someone in the family has the disease and also has one or more of the following primary risk factors:  Overweight.  An inactive lifestyle.  A history of consistently eating high-calorie foods. Maintaining a normal weight and regular physical activity can reduce the chance of developing type 2 diabetes. SYMPTOMS  A person with type 2 diabetes may not show symptoms initially. The symptoms of type 2 diabetes appear slowly. The symptoms include:  Increased thirst (polydipsia).  Increased urination (polyuria).  Increased urination during the night (nocturia).  Weight loss. This weight loss may be rapid.  Frequent, recurring infections.  Tiredness  (fatigue).  Weakness.  Vision changes, such as blurred vision.  Fruity smell to your breath.  Abdominal pain.  Nausea or vomiting.  Cuts or bruises which are slow to heal.  Tingling or numbness in the hands or feet. DIAGNOSIS Type 2 diabetes is frequently not diagnosed until complications of diabetes are present. Type 2 diabetes is diagnosed when symptoms or complications are present and when blood glucose levels are increased. Your blood glucose level may be checked by one or more of the following blood tests:  A fasting blood glucose test. You will not be allowed to eat for at least 8 hours before a blood sample is taken.  A random blood   glucose test. Your blood glucose is checked at any time of the day regardless of when you ate.  A hemoglobin A1c blood glucose test. A hemoglobin A1c test provides information about blood glucose control over the previous 3 months.  An oral glucose tolerance test (OGTT). Your blood glucose is measured after you have not eaten (fasted) for 2 hours and then after you drink a glucose-containing beverage. TREATMENT   You may need to take insulin or diabetes medicine daily to keep blood glucose levels in the desired range.  If you use insulin, you may need to adjust the dosage depending on the carbohydrates that you eat with each meal or snack. The treatment goal is to maintain the before meal blood sugar (preprandial glucose) level at 70-130 mg/dL. HOME CARE INSTRUCTIONS   Have your hemoglobin A1c level checked twice a year.  Perform daily blood glucose monitoring as directed by your health care provider.  Monitor urine ketones when you are ill and as directed by your health care provider.  Take your diabetes medicine or insulin as directed by your health care provider to maintain your blood glucose levels in the desired range.  Never run out of diabetes medicine or insulin. It is needed every day.  If you are using insulin, you may need to  adjust the amount of insulin given based on your intake of carbohydrates. Carbohydrates can raise blood glucose levels but need to be included in your diet. Carbohydrates provide vitamins, minerals, and fiber which are an essential part of a healthy diet. Carbohydrates are found in fruits, vegetables, whole grains, dairy products, legumes, and foods containing added sugars.  Eat healthy foods. You should make an appointment to see a registered dietitian to help you create an eating plan that is right for you.  Lose weight if you are overweight.  Carry a medical alert card or wear your medical alert jewelry.  Carry a 15-gram carbohydrate snack with you at all times to treat low blood glucose (hypoglycemia). Some examples of 15-gram carbohydrate snacks include:  Glucose tablets, 3 or 4.  Glucose gel, 15-gram tube.  Raisins, 2 tablespoons (24 grams).  Jelly beans, 6.  Animal crackers, 8.  Regular pop, 4 ounces (120 mL).  Gummy treats, 9.  Recognize hypoglycemia. Hypoglycemia occurs with blood glucose levels of 70 mg/dL and below. The risk for hypoglycemia increases when fasting or skipping meals, during or after intense exercise, and during sleep. Hypoglycemia symptoms can include:  Tremors or shakes.  Decreased ability to concentrate.  Sweating.  Increased heart rate.  Headache.  Dry mouth.  Hunger.  Irritability.  Anxiety.  Restless sleep.  Altered speech or coordination.  Confusion.  Treat hypoglycemia promptly. If you are alert and able to safely swallow, follow the 15:15 rule:  Take 15-20 grams of rapid-acting glucose or carbohydrate. Rapid-acting options include glucose gel, glucose tablets, or 4 ounces (120 mL) of fruit juice, regular soda, or low-fat milk.  Check your blood glucose level 15 minutes after taking the glucose.  Take 15-20 grams more of glucose if the repeat blood glucose level is still 70 mg/dL or below.  Eat a meal or snack within 1 hour  once blood glucose levels return to normal.  Be alert to feeling very thirsty and urinating more frequently than usual, which are early signs of hyperglycemia. An early awareness of hyperglycemia allows for prompt treatment. Treat hyperglycemia as directed by your health care provider.  Engage in at least 150 minutes of moderate-intensity physical activity   a week, spread over at least 3 days of the week or as directed by your health care provider. In addition, you should engage in resistance exercise at least 2 times a week or as directed by your health care provider. Try to spend no more than 90 minutes at one time inactive.  Adjust your medicine and food intake as needed if you start a new exercise or sport.  Follow your sick-day plan anytime you are unable to eat or drink as usual.  Do not use any tobacco products including cigarettes, chewing tobacco, or electronic cigarettes. If you need help quitting, ask your health care provider.  Limit alcohol intake to no more than 1 drink per day for nonpregnant women and 2 drinks per day for men. You should drink alcohol only when you are also eating food. Talk with your health care provider whether alcohol is safe for you. Tell your health care provider if you drink alcohol several times a week.  Keep all follow-up visits as directed by your health care provider. This is important.  Schedule an eye exam soon after the diagnosis of type 2 diabetes and then annually.  Perform daily skin and foot care. Examine your skin and feet daily for cuts, bruises, redness, nail problems, bleeding, blisters, or sores. A foot exam by a health care provider should be done annually.  Brush your teeth and gums at least twice a day and floss at least once a day. Follow up with your dentist regularly.  Share your diabetes management plan with your workplace or school.  Stay up-to-date with immunizations. It is recommended that people with diabetes who are over 65  years old get the pneumonia vaccine. In some cases, two separate shots may be given. Ask your health care provider if your pneumonia vaccination is up-to-date.  Learn to manage stress.  Obtain ongoing diabetes education and support as needed.  Participate in or seek rehabilitation as needed to maintain or improve independence and quality of life. Request a physical or occupational therapy referral if you are having foot or hand numbness, or difficulties with grooming, dressing, eating, or physical activity. SEEK MEDICAL CARE IF:   You are unable to eat food or drink fluids for more than 6 hours.  You have nausea and vomiting for more than 6 hours.  Your blood glucose level is over 240 mg/dL.  There is a change in mental status.  You develop an additional serious illness.  You have diarrhea for more than 6 hours.  You have been sick or have had a fever for a couple of days and are not getting better.  You have pain during any physical activity.  SEEK IMMEDIATE MEDICAL CARE IF:  You have difficulty breathing.  You have moderate to large ketone levels. MAKE SURE YOU:  Understand these instructions.  Will watch your condition.  Will get help right away if you are not doing well or get worse. Document Released: 09/20/2005 Document Revised: 02/04/2014 Document Reviewed: 04/18/2012 [employer] Patient Information 2015 [employer]. This information is not intended to replace advice given to you by your health care provider. Make sure you discuss any questions you have with your health care provider.  

## 2014-08-01 NOTE — Assessment & Plan Note (Signed)
I will recheck his A1C and will monitor his renal function I have asked him to have his annual eye exam done and to follow up with ENDO

## 2014-08-01 NOTE — Assessment & Plan Note (Signed)
His BP is well controlled I will monitor his lytes and renal function 

## 2014-08-01 NOTE — Progress Notes (Signed)
Pre visit review using our clinic review tool, if applicable. No additional management support is needed unless otherwise documented below in the visit note. 

## 2014-08-01 NOTE — Progress Notes (Signed)
Subjective:    Patient ID: Tim Wilson, male    DOB: 06-03-1966, 48 y.o.   MRN: [id number]  Diabetes He presents for his follow-up diabetic visit. He has type 1 diabetes mellitus. His disease course has been stable. There are no hypoglycemic associated symptoms. Pertinent negatives for hypoglycemia include no dizziness, headaches or tremors. Pertinent negatives for diabetes include no blurred vision, no chest pain, no fatigue, no foot paresthesias, no foot ulcerations, no polydipsia, no polyphagia, no polyuria, no visual change, no weakness and no weight loss. There are no hypoglycemic complications. Symptoms are stable. There are no diabetic complications. Current diabetic treatment includes intensive insulin program and insulin injections. He is compliant with treatment most of the time. His weight is stable. He is following a generally healthy diet. Meal planning includes avoidance of concentrated sweets. He participates in exercise intermittently. There is no change in his home blood glucose trend. An ACE inhibitor/angiotensin II receptor blocker is not being taken. He does not see a podiatrist.Eye exam is not current.      Review of Systems  Constitutional: Negative.  Negative for fever, chills, weight loss, diaphoresis, appetite change and fatigue.  HENT: Negative.   Eyes: Negative.  Negative for blurred vision.  Respiratory: Negative.  Negative for apnea, cough, choking, chest tightness, shortness of breath, wheezing and stridor.   Cardiovascular: Negative.  Negative for chest pain, palpitations and leg swelling.  Gastrointestinal: Negative.  Negative for nausea, vomiting, abdominal pain, diarrhea, constipation and blood in stool.  Endocrine: Negative.  Negative for polydipsia, polyphagia and polyuria.  Genitourinary: Negative.  Negative for difficulty urinating.  Musculoskeletal: Negative.  Negative for arthralgias, back pain, gait problem, joint swelling, myalgias, neck pain and neck  stiffness.  Skin: Negative.   Allergic/Immunologic: Negative.   Neurological: Negative.  Negative for dizziness, tremors, syncope, weakness, light-headedness, numbness and headaches.  Hematological: Negative.  Negative for adenopathy. Does not bruise/bleed easily.  Psychiatric/Behavioral: Negative.        Objective:   Physical Exam  Vitals reviewed. Constitutional: He is oriented to person, place, and time. He appears well-developed and well-nourished. No distress.  HENT:  Head: Normocephalic and atraumatic.  Mouth/Throat: Oropharynx is clear and moist. No oropharyngeal exudate.  Eyes: Conjunctivae are normal. Right eye exhibits no discharge. Left eye exhibits no discharge. No scleral icterus.  Neck: Normal range of motion. Neck supple. No JVD present. No tracheal deviation present. No thyromegaly present.  Cardiovascular: Normal rate, regular rhythm, normal heart sounds and intact distal pulses.  Exam reveals no gallop and no friction rub.   No murmur heard. Pulmonary/Chest: Effort normal and breath sounds normal. No stridor. No respiratory distress. He has no wheezes. He has no rales. He exhibits no tenderness.  Abdominal: Soft. Bowel sounds are normal. He exhibits no distension and no mass. There is no tenderness. There is no rebound and no guarding. Hernia confirmed negative in the right inguinal area and confirmed negative in the left inguinal area.  Genitourinary: Rectum normal, prostate normal, testes normal and penis normal. Rectal exam shows no external hemorrhoid, no internal hemorrhoid, no fissure, no mass, no tenderness and anal tone normal. Guaiac negative stool. Prostate is not enlarged and not tender. Right testis shows no mass, no swelling and no tenderness. Right testis is descended. Left testis shows no mass, no swelling and no tenderness. Left testis is descended. Circumcised. No penile erythema or penile tenderness. No discharge found.  Musculoskeletal: Normal range of  motion. He exhibits no edema and no  tenderness.  Lymphadenopathy:    He has no cervical adenopathy.       Right: No inguinal adenopathy present.       Left: No inguinal adenopathy present.  Neurological: He is oriented to person, place, and time.  Skin: Skin is warm and dry. No rash noted. He is not diaphoretic. No erythema. No pallor.  Psychiatric: He has a normal mood and affect. His behavior is normal. Judgment and thought content normal.    Lab Results  Component Value Date   WBC 7.2 07/30/2013   HGB 13.8 07/30/2013   HCT 38.5* 07/30/2013   PLT 318.0 07/30/2013   GLUCOSE 218* 07/30/2013   CHOL 406 Lipemic* 07/30/2013   TRIG 3442.0 Repeated and verified X2.* 07/30/2013   HDL 19.10* 07/30/2013   LDLDIRECT 74.3 07/30/2013   LDLCALC 102* 09/14/2010   ALT 20 07/30/2013   AST 16 07/30/2013   NA 133 Lipemic* 07/30/2013   K 4.3 07/30/2013   CL 97 07/30/2013   CREATININE 0.9 07/30/2013   BUN 9 07/30/2013   CO2 27 07/30/2013   TSH 1.63 07/30/2013   PSA 0.54 07/30/2013   HGBA1C 10.2* 07/30/2013   MICROALBUR 18.4* 07/10/2010        Assessment & Plan:

## 2014-08-01 NOTE — Assessment & Plan Note (Signed)
Exam done Vaccines were reviewed and updated Labs ordered Pt ed material was given 

## 2014-08-02 ENCOUNTER — Telehealth: Payer: Self-pay | Admitting: Internal Medicine

## 2014-08-02 ENCOUNTER — Encounter: Payer: Self-pay | Admitting: Internal Medicine

## 2014-08-02 ENCOUNTER — Other Ambulatory Visit: Payer: Self-pay

## 2014-08-02 ENCOUNTER — Other Ambulatory Visit: Payer: Self-pay | Admitting: Internal Medicine

## 2014-08-02 DIAGNOSIS — I10 Essential (primary) hypertension: Secondary | ICD-10-CM

## 2014-08-02 DIAGNOSIS — Z23 Encounter for immunization: Secondary | ICD-10-CM

## 2014-08-02 LAB — CBC WITH DIFFERENTIAL/PLATELET

## 2014-08-02 LAB — PSA: PSA: 0.57 ng/mL (ref <=4.00)

## 2014-08-02 LAB — HEMOGLOBIN A1C

## 2014-08-02 MED ORDER — OLMESARTAN MEDOXOMIL 20 MG PO TABS: 20.0000 mg | ORAL_TABLET | Freq: Every day | ORAL | Status: DC

## 2014-08-02 NOTE — Addendum Note (Signed)
Addended by: Estell Harpin T on: 08/02/2014 09:12 AM   Modules accepted: Orders

## 2014-08-02 NOTE — Telephone Encounter (Signed)
emmi mailed  °

## 2014-08-09 ENCOUNTER — Ambulatory Visit (INDEPENDENT_AMBULATORY_CARE_PROVIDER_SITE_OTHER): Payer: Federal, State, Local not specified - PPO | Admitting: Endocrinology

## 2014-08-09 ENCOUNTER — Telehealth: Payer: Self-pay | Admitting: Endocrinology

## 2014-08-09 ENCOUNTER — Encounter: Payer: Self-pay | Admitting: Endocrinology

## 2014-08-09 ENCOUNTER — Other Ambulatory Visit: Payer: Self-pay | Admitting: Endocrinology

## 2014-08-09 VITALS — BP 112/80 | HR 78 | Temp 98.4°F | Ht 68.0 in | Wt 212.0 lb

## 2014-08-09 DIAGNOSIS — E118 Type 2 diabetes mellitus with unspecified complications: Secondary | ICD-10-CM

## 2014-08-09 LAB — HEMOGLOBIN A1C: Hgb A1c MFr Bld: 9.9 % — ABNORMAL HIGH (ref 4.6–6.5)

## 2014-08-09 LAB — MICROALBUMIN / CREATININE URINE RATIO
Creatinine,U: 272.9 mg/dL
MICROALB/CREAT RATIO: 21.8 mg/g (ref 0.0–30.0)
Microalb, Ur: 59.6 mg/dL — ABNORMAL HIGH (ref 0.0–1.9)

## 2014-08-09 MED ORDER — PIOGLITAZONE HCL-METFORMIN HCL 15-850 MG PO TABS: 1.0000 | ORAL_TABLET | Freq: Three times a day (TID) | ORAL | Status: DC

## 2014-08-09 NOTE — Progress Notes (Signed)
Subjective:    Patient ID: Tim Wilson, male    DOB: 12-27-1965, 48 y.o.   MRN: [id number]  HPI  Pt returns for f/u of diabetes mellitus: DM type: 2 Dx'ed: 7579 Complications: none Therapy: insulin for a few mos, 2012, and 2014-2015 DKA: never Severe hypoglycemia: never Pancreatitis: never Other: he is willing to take insulin only as often as once a day Interval history: He stopped taking the insulin in mid-2015.  no cbg record, but states cbg's are in the mid-100's. pt states he feels well in general. Past Medical History  Diagnosis Date  . Hyperlipidemia   . Diabetes mellitus   . Hypertension     History reviewed. No pertinent past surgical history.  History   Social History  . Marital Status: Married    Spouse Name: N/A    Number of Children: N/A  . Years of Education: N/A   Occupational History  . 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 Smoking status: Never Smoker   . Smokeless tobacco: Never Used  . Alcohol Use: 1.2 oz/week    2 Cans of beer per week     Comment: on occasion, not in excess  . Drug Use: No  . Sexual Activity: Yes   Other Topics Concern  . Not on file   Social History Narrative   Regular exercise-yes    Current Outpatient Prescriptions on File Prior to Visit  Medication Sig Dispense Refill  . aspirin 325 MG EC tablet Take 325 mg by mouth daily.      Marland Kitchen BENICAR 20 MG tablet TAKE 1 TABLET BY MOUTH ONCE DAILY 90 tablet 2  . Blood Glucose Monitoring Suppl (ONE TOUCH ULTRA 2) W/DEVICE KIT Use two times a day to test blood sugar     . Capsicum, Cayenne, (CAYENNE PEPPER) 450 MG CAPS Take 1 capsule by mouth 3 (three) times a week.      . famotidine (PEPCID) 20 MG tablet Take 1 tablet (20 mg total) by mouth 2 (two) times daily. 30 tablet 0  . fenofibrate (TRICOR) 145 MG tablet Take 1 tablet (145 mg total) by mouth daily. 90 tablet 3  . glucose blood (ONETOUCH VERIO) test strip 1 each  by Other route 2 (two) times daily. And lancets 2/day 250.01 100 each 12  . olmesartan (BENICAR) 20 MG tablet Take 1 tablet (20 mg total) by mouth daily. 90 tablet 3  . omega-3 acid ethyl esters (LOVAZA) 1 G capsule Take 2 capsules (2 g total) by mouth 2 (two) times daily. 120 capsule 11  . Omega-3 Fatty Acids (FISH OIL) 1000 MG CAPS Take 4 capsules by mouth daily.      . rosuvastatin (CRESTOR) 20 MG tablet Take 1 tablet (20 mg total) by mouth daily. 90 tablet 3  . Insulin Detemir (LEVEMIR FLEXTOUCH) 100 UNIT/ML SOPN Inject 10 Units into the skin every morning. And pen needles 1/day 5 pen 11  . [DISCONTINUED] colchicine 0.6 MG tablet Take 1 tablet (0.6 mg total) by mouth 2 (two) times daily. 60 tablet 2   No current facility-administered medications on file prior to visit.    Allergies  Allergen Reactions  . Ramipril     cough    Family History  Problem Relation Age of Onset  . Diabetes Mother   . Heart disease Father   . Heart disease Brother     BP 112/80 mmHg  Pulse 78  Temp(Src) 98.4 F (36.9 C) (Oral)  Ht '5\' 8"'  (1.727 m)  Wt 212 lb (96.163 kg)  BMI 32.24 kg/m2  SpO2 96%    Review of Systems He denies weight change and n/v    Objective:   Physical Exam VITAL SIGNS:  See vs page. GENERAL: no distress.  Pulses: dorsalis pedis intact bilat.   Feet: no deformity.  no edema. Skin:  no ulcer on the feet.  normal color and temp. Neuro: sensation is intact to touch on the feet.        Assessment & Plan:  DM: uncertain control Hypertriglyceridemia: pioglitizone would be a good choice, if he needs 1 oral med only. Noncompliance with cbg recording, insulin, and f/u appts: I'll work around this as best I can.   Patient is advised the following: Patient Instructions  check your blood sugar twice a day.  vary the time of day when you check, between before the 3 meals, and at bedtime.  also check if you have symptoms of your blood sugar being too high or too low.  please  keep a record of the readings and bring it to your next appointment here.  You can write it on any piece of paper.  please call us sooner if your blood sugar goes below 70, or if you have a lot of readings over 200.   blood tests are being requested for you today.  We'll contact you with results.    If it is slightly high, I can prescribe for you a pill called "pioglitizone," (because this helps the triglycerides also).   If it is very high, you should resume the insulin.   Please come back for a follow-up appointment in 3 months.

## 2014-08-09 NOTE — Patient Instructions (Addendum)
check your blood sugar twice a day.  vary the time of day when you check, between before the 3 meals, and at bedtime.  also check if you have symptoms of your blood sugar being too high or too low.  please keep a record of the readings and bring it to your next appointment here.  You can write it on any piece of paper.  please call us sooner if your blood sugar goes below 70, or if you have a lot of readings over 200.   blood tests are being requested for you today.  We'll contact you with results.    If it is slightly high, I can prescribe for you a pill called "pioglitizone," (because this helps the triglycerides also).   If it is very high, you should resume the insulin.   Please come back for a follow-up appointment in 3 months.

## 2014-08-09 NOTE — Telephone Encounter (Signed)
please call patient: Please come back for a follow-up appointment in 3 months

## 2014-08-09 NOTE — Progress Notes (Signed)
Pre visit review using our clinic review tool, if applicable. No additional management support is needed unless otherwise documented below in the visit note. 

## 2014-08-12 NOTE — Telephone Encounter (Signed)
Contacted pt. Pt scheduled for 3 month follow up on 11/15/2014.

## 2014-09-11 ENCOUNTER — Other Ambulatory Visit: Payer: Self-pay | Admitting: Internal Medicine

## 2014-11-15 ENCOUNTER — Encounter: Payer: Self-pay | Admitting: Endocrinology

## 2014-11-15 ENCOUNTER — Ambulatory Visit (INDEPENDENT_AMBULATORY_CARE_PROVIDER_SITE_OTHER): Payer: Federal, State, Local not specified - PPO | Admitting: Endocrinology

## 2014-11-15 VITALS — BP 136/92 | HR 76 | Temp 97.8°F | Ht 68.0 in | Wt 216.0 lb

## 2014-11-15 DIAGNOSIS — E118 Type 2 diabetes mellitus with unspecified complications: Secondary | ICD-10-CM

## 2014-11-15 DIAGNOSIS — E781 Pure hyperglyceridemia: Secondary | ICD-10-CM

## 2014-11-15 LAB — BASIC METABOLIC PANEL
BUN: 12 mg/dL (ref 6–23)
CO2: 31 meq/L (ref 19–32)
CREATININE: 1.06 mg/dL (ref 0.40–1.50)
Calcium: 10.1 mg/dL (ref 8.4–10.5)
Chloride: 103 mEq/L (ref 96–112)
GFR: 95.53 mL/min (ref >60.00)
Glucose, Bld: 137 mg/dL — ABNORMAL HIGH (ref 70–99)
Potassium: 4.4 mEq/L (ref 3.5–5.1)
Sodium: 138 mEq/L (ref 135–145)

## 2014-11-15 LAB — HEMOGLOBIN A1C: Hgb A1c MFr Bld: 7.3 % — ABNORMAL HIGH (ref 4.6–6.5)

## 2014-11-15 LAB — LIPID PANEL
CHOL/HDL RATIO: 8
CHOLESTEROL: 263 mg/dL — AB (ref 0–200)
HDL: 32.9 mg/dL — ABNORMAL LOW (ref >39.00)
Triglycerides: 802 mg/dL — ABNORMAL HIGH (ref 0.0–149.0)

## 2014-11-15 LAB — LDL CHOLESTEROL, DIRECT: Direct LDL: 95 mg/dL

## 2014-11-15 MED ORDER — CANAGLIFLOZIN 300 MG PO TABS: 300.0000 mg | ORAL_TABLET | Freq: Every day | ORAL | Status: DC

## 2014-11-15 NOTE — Progress Notes (Signed)
Subjective:    Patient ID: Tim Wilson, male    DOB: 1966/08/05, 49 y.o.   MRN: [id number]  HPI Pt returns for f/u of diabetes mellitus: DM type: 2 Dx'ed: 5364 Complications: none Therapy: insulin in 2012, and 2014-2015.  Now 2 oral meds DKA: never Severe hypoglycemia: never Pancreatitis: never Other: he is willing to take insulin only as often as once a day. Interval history: no cbg record, but states cbg's are in the low-100's.   Past Medical History  Diagnosis Date  . Hyperlipidemia   . Diabetes mellitus   . Hypertension     No past surgical history on file.  History   Social History  . Marital Status: Married    Spouse Name: N/A  . Number of Children: N/A  . Years of Education: N/A   Occupational History  . 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 Smoking status: Never Smoker   . Smokeless tobacco: Never Used  . Alcohol Use: 1.2 oz/week    2 Cans of beer per week     Comment: on occasion, not in excess  . Drug Use: No  . Sexual Activity: Yes   Other Topics Concern  . Not on file   Social History Narrative   Regular exercise-yes    Current Outpatient Prescriptions on File Prior to Visit  Medication Sig Dispense Refill  . aspirin 325 MG EC tablet Take 325 mg by mouth daily.      . Blood Glucose Monitoring Suppl (ONE TOUCH ULTRA 2) W/DEVICE KIT Use two times a day to test blood sugar     . Capsicum, Cayenne, (CAYENNE PEPPER) 450 MG CAPS Take 1 capsule by mouth 3 (three) times a week.      . CRESTOR 20 MG tablet TAKE 1 TABLET BY MOUTH ONCE DAILY 90 tablet 0  . famotidine (PEPCID) 20 MG tablet Take 1 tablet (20 mg total) by mouth 2 (two) times daily. 30 tablet 0  . fenofibrate (TRICOR) 145 MG tablet TAKE 1 TABLET BY MOUTH ONCE DAILY 90 tablet 0  . glucose blood (ONETOUCH VERIO) test strip 1 each by Other route 2 (two) times daily. And lancets 2/day 250.01 100 each 12  . olmesartan (BENICAR) 20  MG tablet Take 1 tablet (20 mg total) by mouth daily. 90 tablet 3  . omega-3 acid ethyl esters (LOVAZA) 1 G capsule Take 2 capsules (2 g total) by mouth 2 (two) times daily. 120 capsule 11  . Omega-3 Fatty Acids (FISH OIL) 1000 MG CAPS Take 4 capsules by mouth daily.      . pioglitazone-metformin (ACTOPLUS MET) 15-850 MG per tablet Take 1 tablet by mouth 3 (three) times daily. 900 tablet 11  . [DISCONTINUED] colchicine 0.6 MG tablet Take 1 tablet (0.6 mg total) by mouth 2 (two) times daily. 60 tablet 2   No current facility-administered medications on file prior to visit.    Allergies  Allergen Reactions  . Ramipril     cough    Family History  Problem Relation Age of Onset  . Diabetes Mother   . Heart disease Father   . Heart disease Brother     BP 136/92 mmHg  Pulse 76  Temp(Src) 97.8 F (36.6 C) (Oral)  Ht 5' 8" (1.727 m)  Wt 216 lb (97.977 kg)  BMI 32.85 kg/m2  SpO2 97%   Review of Systems He denies hypoglycemia.  He has gained  weight.  He has slight diarrhea, due to metformin.    Objective:   Physical Exam VITAL SIGNS:  See vs page. GENERAL: no distress. Pulses: dorsalis pedis intact bilat.   MSK: no deformity of the feet.   CV: no leg edema.   Skin:  no ulcer on the feet.  normal color and temp on the feet. Neuro: sensation is intact to touch on the feet.    Lab Results  Component Value Date   HGBA1C 7.3* 11/15/2014       Assessment & Plan:  DM: mild exacerbation. Diarrhea: improved.  Pt declines to reduce metformin Weight gain: pt is advised to re-lose.     Patient is advised the following: Patient Instructions  check your blood sugar twice a day.  vary the time of day when you check, between before the 3 meals, and at bedtime.  also check if you have symptoms of your blood sugar being too high or too low.  please keep a record of the readings and bring it to your next appointment here.  You can write it on any piece of paper.  please call us sooner  if your blood sugar goes below 70, or if you have a lot of readings over 200.   blood tests are being requested for you today.  We'll contact you with results.    If it is slightly high, I can prescribe for you a pill called "invokana." Please come back for a follow-up appointment in 3 months.   addendum: i have sent a prescription to your pharmacy, to add invokana.

## 2014-11-15 NOTE — Patient Instructions (Addendum)
check your blood sugar twice a day.  vary the time of day when you check, between before the 3 meals, and at bedtime.  also check if you have symptoms of your blood sugar being too high or too low.  please keep a record of the readings and bring it to your next appointment here.  You can write it on any piece of paper.  please call us sooner if your blood sugar goes below 70, or if you have a lot of readings over 200.   blood tests are being requested for you today.  We'll contact you with results.    If it is slightly high, I can prescribe for you a pill called "invokana." Please come back for a follow-up appointment in 3 months.

## 2014-11-19 DIAGNOSIS — Z0279 Encounter for issue of other medical certificate: Secondary | ICD-10-CM

## 2014-11-25 ENCOUNTER — Telehealth: Payer: Self-pay | Admitting: Endocrinology

## 2014-11-25 NOTE — Telephone Encounter (Signed)
Lvom advising of note below. Requested call back if pt would like to discuss.  

## 2014-11-25 NOTE — Telephone Encounter (Signed)
please call patient: Insurance refused invokana.  They want you to take glimepiride first, but your blood sugar is too low to take this. Therefore, for diabetes, take just pioglitizine+metformin for now. Please come back for a follow-up appointment in 3 months

## 2015-02-14 ENCOUNTER — Ambulatory Visit (INDEPENDENT_AMBULATORY_CARE_PROVIDER_SITE_OTHER): Payer: Federal, State, Local not specified - PPO | Admitting: Endocrinology

## 2015-02-14 ENCOUNTER — Encounter: Payer: Self-pay | Admitting: Endocrinology

## 2015-02-14 VITALS — BP 120/86 | HR 78 | Temp 97.5°F | Wt 212.0 lb

## 2015-02-14 DIAGNOSIS — E118 Type 2 diabetes mellitus with unspecified complications: Secondary | ICD-10-CM | POA: Diagnosis not present

## 2015-02-14 LAB — HEMOGLOBIN A1C: Hgb A1c MFr Bld: 7.6 % — ABNORMAL HIGH (ref 4.6–6.5)

## 2015-02-14 MED ORDER — SITAGLIPTIN PHOSPHATE 100 MG PO TABS: 100.0000 mg | ORAL_TABLET | Freq: Every day | ORAL | Status: DC

## 2015-02-14 NOTE — Patient Instructions (Addendum)
check your blood sugar twice a day.  vary the time of day when you check, between before the 3 meals, and at bedtime.  also check if you have symptoms of your blood sugar being too high or too low.  please keep a record of the readings and bring it to your next appointment here.  You can write it on any piece of paper.  please call us sooner if your blood sugar goes below 70, or if you have a lot of readings over 200.   blood tests are being requested for you today.  We'll contact you with results.    If it is slightly high, I can prescribe for you a pill called "Tonga."  If the insurance declines this, we'll go with "repaglinide." Please come back for a follow-up appointment in 3 months.

## 2015-02-14 NOTE — Progress Notes (Signed)
Subjective:    Patient ID: Tim Wilson, male    DOB: May 14, 1966, 49 y.o.   MRN: [id number]  HPI Pt returns for f/u of diabetes mellitus: DM type: 2 Dx'ed: 5790 Complications: none Therapy: 2 oral meds DKA: never Severe hypoglycemia: never Pancreatitis: never Other: he took insulin in 2012, and 2014-2015; insurance refused invokana.  Interval history: no cbg record, but states cbg's vary from 90-180.  It is in general higher as the day goes on.  pt states he feels well in general.   Past Medical History  Diagnosis Date  . Hyperlipidemia   . Diabetes mellitus   . Hypertension     No past surgical history on file.  History   Social History  . Marital Status: Married    Spouse Name: N/A  . Number of Children: N/A  . Years of Education: N/A   Occupational History  . 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 Smoking status: Never Smoker   . Smokeless tobacco: Never Used  . Alcohol Use: 1.2 oz/week    2 Cans of beer per week     Comment: on occasion, not in excess  . Drug Use: No  . Sexual Activity: Yes   Other Topics Concern  . Not on file   Social History Narrative   Regular exercise-yes    Current Outpatient Prescriptions on File Prior to Visit  Medication Sig Dispense Refill  . aspirin 325 MG EC tablet Take 325 mg by mouth daily.      . Blood Glucose Monitoring Suppl (ONE TOUCH ULTRA 2) W/DEVICE KIT Use two times a day to test blood sugar     . Capsicum, Cayenne, (CAYENNE PEPPER) 450 MG CAPS Take 1 capsule by mouth 3 (three) times a week.      . CRESTOR 20 MG tablet TAKE 1 TABLET BY MOUTH ONCE DAILY 90 tablet 0  . famotidine (PEPCID) 20 MG tablet Take 1 tablet (20 mg total) by mouth 2 (two) times daily. 30 tablet 0  . fenofibrate (TRICOR) 145 MG tablet TAKE 1 TABLET BY MOUTH ONCE DAILY 90 tablet 0  . glucose blood (ONETOUCH VERIO) test strip 1 each by Other route 2 (two) times daily. And lancets  2/day 250.01 100 each 12  . olmesartan (BENICAR) 20 MG tablet Take 1 tablet (20 mg total) by mouth daily. 90 tablet 3  . omega-3 acid ethyl esters (LOVAZA) 1 G capsule Take 2 capsules (2 g total) by mouth 2 (two) times daily. 120 capsule 11  . Omega-3 Fatty Acids (FISH OIL) 1000 MG CAPS Take 4 capsules by mouth daily.      . pioglitazone-metformin (ACTOPLUS MET) 15-850 MG per tablet Take 1 tablet by mouth 3 (three) times daily. 900 tablet 11  . [DISCONTINUED] colchicine 0.6 MG tablet Take 1 tablet (0.6 mg total) by mouth 2 (two) times daily. 60 tablet 2   No current facility-administered medications on file prior to visit.    Allergies  Allergen Reactions  . Ramipril     cough    Family History  Problem Relation Age of Onset  . Diabetes Mother   . Heart disease Father   . Heart disease Brother     BP 120/86 mmHg  Pulse 78  Temp(Src) 97.5 F (36.4 C) (Oral)  Wt 212 lb (96.163 kg)  SpO2 96%    Review of Systems He denies hypoglycemia.  Objective:   Physical Exam VITAL SIGNS:  See vs page GENERAL: no distress Pulses: dorsalis pedis intact bilat.   MSK: no deformity of the feet CV: no leg edema Skin:  no ulcer on the feet.  normal color and temp on the feet. Neuro: sensation is intact to touch on the feet.    Lab Results  Component Value Date   HGBA1C 7.6* 02/14/2015      Assessment & Plan:  DM: slightly worse: i have sent a prescription to your pharmacy, to add "Tonga."  Patient is advised the following: Patient Instructions  check your blood sugar twice a day.  vary the time of day when you check, between before the 3 meals, and at bedtime.  also check if you have symptoms of your blood sugar being too high or too low.  please keep a record of the readings and bring it to your next appointment here.  You can write it on any piece of paper.  please call us sooner if your blood sugar goes below 70, or if you have a lot of readings over 200.   blood tests are  being requested for you today.  We'll contact you with results.    If it is slightly high, I can prescribe for you a pill called "Tonga."  If the insurance declines this, we'll go with "repaglinide." Please come back for a follow-up appointment in 3 months.

## 2015-02-17 ENCOUNTER — Other Ambulatory Visit: Payer: Self-pay | Admitting: Internal Medicine

## 2015-04-23 ENCOUNTER — Ambulatory Visit (INDEPENDENT_AMBULATORY_CARE_PROVIDER_SITE_OTHER): Payer: Federal, State, Local not specified - PPO | Admitting: Internal Medicine

## 2015-04-23 ENCOUNTER — Encounter: Payer: Self-pay | Admitting: Internal Medicine

## 2015-04-23 VITALS — BP 154/88 | HR 83 | Temp 99.3°F | Resp 16 | Ht 68.0 in | Wt 210.0 lb

## 2015-04-23 DIAGNOSIS — J069 Acute upper respiratory infection, unspecified: Secondary | ICD-10-CM | POA: Insufficient documentation

## 2015-04-23 DIAGNOSIS — B9789 Other viral agents as the cause of diseases classified elsewhere: Principal | ICD-10-CM

## 2015-04-23 MED ORDER — HYDROCODONE-HOMATROPINE 5-1.5 MG/5ML PO SYRP: 5.0000 mL | ORAL_SOLUTION | Freq: Three times a day (TID) | ORAL | Status: DC | PRN

## 2015-04-23 NOTE — Patient Instructions (Signed)
Upper Respiratory Infection, Adult An upper respiratory infection (URI) is also known as the common cold. It is often caused by a type of germ (virus). Colds are easily spread (contagious). You can pass it to others by kissing, coughing, sneezing, or drinking out of the same glass. Usually, you get better in 1 or 2 weeks.  HOME CARE   Only take medicine as told by your doctor.  Use a warm mist humidifier or breathe in steam from a hot shower.  Drink enough water and fluids to keep your pee (urine) clear or pale yellow.  Get plenty of rest.  Return to work when your temperature is back to normal or as told by your doctor. You may use a face mask and wash your hands to stop your cold from spreading. GET HELP RIGHT AWAY IF:   After the first few days, you feel you are getting worse.  You have questions about your medicine.  You have chills, shortness of breath, or brown or red spit (mucus).  You have yellow or brown snot (nasal discharge) or pain in the face, especially when you bend forward.  You have a fever, puffy (swollen) neck, pain when you swallow, or white spots in the back of your throat.  You have a bad headache, ear pain, sinus pain, or chest pain.  You have a high-pitched whistling sound when you breathe in and out (wheezing).  You have a lasting cough or cough up blood.  You have sore muscles or a stiff neck. MAKE SURE YOU:   Understand these instructions.  Will watch your condition.  Will get help right away if you are not doing well or get worse. Document Released: 03/08/2008 Document Revised: 12/13/2011 Document Reviewed: 12/26/2013 Exit[employer] Patient Information 2015 [employer]. This information is not intended to replace advice given to you by your health care provider. Make sure you discuss any questions you have with your health care provider.  

## 2015-04-23 NOTE — Progress Notes (Signed)
Subjective:  Patient ID: Tim Wilson, male    DOB: 11-Jun-1966  Age: 49 y.o. MRN: [id number]  CC: Sore Throat   HPI DHAVAL WOO presents for ST and NP cough for one week. The cough is worse at night.  Outpatient Prescriptions Prior to Visit  Medication Sig Dispense Refill  . aspirin 325 MG EC tablet Take 325 mg by mouth daily.      . Blood Glucose Monitoring Suppl (ONE TOUCH ULTRA 2) W/DEVICE KIT Use two times a day to test blood sugar     . Capsicum, Cayenne, (CAYENNE PEPPER) 450 MG CAPS Take 1 capsule by mouth 3 (three) times a week.      . CRESTOR 20 MG tablet TAKE 1 TABLET BY MOUTH ONCE DAILY 90 tablet 0  . famotidine (PEPCID) 20 MG tablet Take 1 tablet (20 mg total) by mouth 2 (two) times daily. 30 tablet 0  . fenofibrate (TRICOR) 145 MG tablet TAKE 1 TABLET BY MOUTH ONCE DAILY 90 tablet 0  . glucose blood (ONETOUCH VERIO) test strip 1 each by Other route 2 (two) times daily. And lancets 2/day 250.01 100 each 12  . olmesartan (BENICAR) 20 MG tablet Take 1 tablet (20 mg total) by mouth daily. 90 tablet 3  . omega-3 acid ethyl esters (LOVAZA) 1 G capsule Take 2 capsules (2 g total) by mouth 2 (two) times daily. 120 capsule 11  . Omega-3 Fatty Acids (FISH OIL) 1000 MG CAPS Take 4 capsules by mouth daily.      . pioglitazone-metformin (ACTOPLUS MET) 15-850 MG per tablet Take 1 tablet by mouth 3 (three) times daily. 900 tablet 11  . sitaGLIPtin (JANUVIA) 100 MG tablet Take 1 tablet (100 mg total) by mouth daily. 30 tablet 11   No facility-administered medications prior to visit.    ROS Review of Systems  Constitutional: Negative.  Negative for fever, chills, diaphoresis, appetite change and fatigue.  HENT: Positive for postnasal drip, rhinorrhea and sore throat. Negative for congestion, sinus pressure and trouble swallowing.   Eyes: Negative.   Respiratory: Positive for cough. Negative for apnea, choking, chest tightness, shortness of breath and stridor.   Cardiovascular:  Negative.  Negative for chest pain, palpitations and leg swelling.  Gastrointestinal: Negative.  Negative for nausea, vomiting, abdominal pain and diarrhea.  Endocrine: Negative.   Genitourinary: Negative.   Musculoskeletal: Negative.   Skin: Negative.  Negative for rash.  Allergic/Immunologic: Negative.   Neurological: Negative.   Hematological: Negative.  Negative for adenopathy. Does not bruise/bleed easily.  Psychiatric/Behavioral: Negative.     Objective:  BP 154/88 mmHg  Pulse 83  Temp(Src) 99.3 F (37.4 C) (Oral)  Resp 16  Ht _0  (1.727 m)  Wt 210 lb (95.255 kg)  BMI 31.94 kg/m2  SpO2 97%  BP Readings from Last 3 Encounters:  04/23/15 154/88  02/14/15 120/86  11/15/14 136/92    Wt Readings from Last 3 Encounters:  04/23/15 210 lb (95.255 kg)  02/14/15 212 lb (96.163 kg)  11/15/14 216 lb (97.977 kg)    Physical Exam  Constitutional: He is oriented to person, place, and time.  Non-toxic appearance. He does not have a sickly appearance. He does not appear ill. No distress.  HENT:  Mouth/Throat: Mucous membranes are normal. Mucous membranes are not pale, not dry and not cyanotic. No oral lesions. No trismus in the jaw. No uvula swelling. Posterior oropharyngeal erythema present. No oropharyngeal exudate, posterior oropharyngeal edema or tonsillar abscesses.  Eyes: Conjunctivae are normal.  Right eye exhibits no discharge. Left eye exhibits no discharge. No scleral icterus.  Neck: Normal range of motion. Neck supple. No JVD present. No tracheal deviation present. No thyromegaly present.  Cardiovascular: Normal rate, regular rhythm, normal heart sounds and intact distal pulses.  Exam reveals no gallop and no friction rub.   No murmur heard. Pulmonary/Chest: Effort normal and breath sounds normal. No stridor. No respiratory distress. He has no wheezes. He has no rales. He exhibits no tenderness.  Abdominal: Soft. Bowel sounds are normal. He exhibits no distension and no  mass. There is no tenderness. There is no rebound and no guarding.  Musculoskeletal: Normal range of motion. He exhibits no edema or tenderness.  Lymphadenopathy:    He has no cervical adenopathy.  Neurological: He is oriented to person, place, and time.  Skin: Skin is warm and dry. No rash noted. He is not diaphoretic. No erythema. No pallor.  Psychiatric: He has a normal mood and affect. His behavior is normal. Judgment and thought content normal.    Lab Results  Component Value Date   WBC CANCELED 08/01/2014   HGB CANCELED 08/01/2014   HCT CANCELED 08/01/2014   PLT CANCELED 08/01/2014   GLUCOSE 137* 11/15/2014   CHOL 263* 11/15/2014   TRIG * 11/15/2014    802.0 Triglyceride is over 400; calculations on Lipids are invalid.   HDL 32.90* 11/15/2014   LDLDIRECT 95.0 11/15/2014   LDLCALC NOT CALC 08/01/2014   ALT 21 08/01/2014   AST 22 08/01/2014   NA 138 11/15/2014   K 4.4 11/15/2014   CL 103 11/15/2014   CREATININE 1.06 11/15/2014   BUN 12 11/15/2014   CO2 31 11/15/2014   TSH 1.532 08/01/2014   PSA 0.57 08/01/2014   HGBA1C 7.6* 02/14/2015   MICROALBUR 59.6* 08/09/2014    No results found.  Assessment & Plan:   Bradley was seen today for sore throat.  Diagnoses and all orders for this visit:  Viral URI with cough- this is a viral infection so I will did not prescribe antibiotics. Will offer symptomatic relief with Hycodan. Orders: -     HYDROcodone-homatropine (HYCODAN) 5-1.5 MG/5ML syrup; Take 5 mLs by mouth every 8 (eight) hours as needed for cough.  I am having Mr. Stuckert start on HYDROcodone-homatropine. I am also having him maintain his aspirin, Cayenne Pepper, Fish Oil, ONE TOUCH ULTRA 2, omega-3 acid ethyl esters, glucose blood, famotidine, olmesartan, pioglitazone-metformin, CRESTOR, sitaGLIPtin, and fenofibrate.  Meds ordered this encounter  Medications  . HYDROcodone-homatropine (HYCODAN) 5-1.5 MG/5ML syrup    Sig: Take 5 mLs by mouth every 8 (eight) hours  as needed for cough.    Dispense:  120 mL    Refill:  0     Follow-up: Return in about 2 weeks (around 05/07/2015).  Scarlette Calico, MD

## 2015-04-23 NOTE — Progress Notes (Signed)
Pre visit review using our clinic review tool, if applicable. No additional management support is needed unless otherwise documented below in the visit note. 

## 2015-05-19 ENCOUNTER — Ambulatory Visit: Payer: Federal, State, Local not specified - PPO | Admitting: Endocrinology

## 2015-06-13 ENCOUNTER — Telehealth: Payer: Self-pay | Admitting: *Deleted

## 2015-06-13 MED ORDER — FENOFIBRATE 145 MG PO TABS: 145.0000 mg | ORAL_TABLET | Freq: Every day | ORAL | Status: DC

## 2015-06-13 MED ORDER — ROSUVASTATIN CALCIUM 20 MG PO TABS: 20.0000 mg | ORAL_TABLET | Freq: Every day | ORAL | Status: DC

## 2015-06-13 NOTE — Telephone Encounter (Signed)
Left msg on triage requesting refill on pt Fenofibrate & crestor. Per chart pt had cpx back in May 2016, will send electronically...Johny Chess

## 2015-08-13 ENCOUNTER — Other Ambulatory Visit: Payer: Self-pay | Admitting: Internal Medicine

## 2015-09-03 ENCOUNTER — Other Ambulatory Visit (INDEPENDENT_AMBULATORY_CARE_PROVIDER_SITE_OTHER): Payer: Federal, State, Local not specified - PPO

## 2015-09-03 ENCOUNTER — Encounter: Payer: Self-pay | Admitting: Internal Medicine

## 2015-09-03 ENCOUNTER — Ambulatory Visit (INDEPENDENT_AMBULATORY_CARE_PROVIDER_SITE_OTHER): Payer: Federal, State, Local not specified - PPO | Admitting: Internal Medicine

## 2015-09-03 VITALS — BP 124/88 | HR 78 | Temp 98.3°F | Resp 16 | Ht 68.0 in | Wt 217.0 lb

## 2015-09-03 DIAGNOSIS — Z794 Long term (current) use of insulin: Secondary | ICD-10-CM

## 2015-09-03 DIAGNOSIS — Z0000 Encounter for general adult medical examination without abnormal findings: Secondary | ICD-10-CM

## 2015-09-03 DIAGNOSIS — E785 Hyperlipidemia, unspecified: Secondary | ICD-10-CM

## 2015-09-03 DIAGNOSIS — I10 Essential (primary) hypertension: Secondary | ICD-10-CM | POA: Diagnosis not present

## 2015-09-03 DIAGNOSIS — E781 Pure hyperglyceridemia: Secondary | ICD-10-CM

## 2015-09-03 DIAGNOSIS — E118 Type 2 diabetes mellitus with unspecified complications: Secondary | ICD-10-CM

## 2015-09-03 DIAGNOSIS — R7989 Other specified abnormal findings of blood chemistry: Secondary | ICD-10-CM | POA: Diagnosis not present

## 2015-09-03 DIAGNOSIS — R809 Proteinuria, unspecified: Secondary | ICD-10-CM

## 2015-09-03 LAB — CBC WITH DIFFERENTIAL/PLATELET
BASOS PCT: 0.5 % (ref 0.0–3.0)
Basophils Absolute: 0 10*3/uL (ref 0.0–0.1)
EOS ABS: 0.2 10*3/uL (ref 0.0–0.7)
Eosinophils Relative: 3 % (ref 0.0–5.0)
HCT: 41.3 % (ref 39.0–52.0)
HEMOGLOBIN: 14.5 g/dL (ref 13.0–17.0)
LYMPHS ABS: 3.9 10*3/uL (ref 0.7–4.0)
LYMPHS PCT: 51.7 % — AB (ref 12.0–46.0)
MCHC: 35 g/dL (ref 30.0–36.0)
MCV: 80.9 fl (ref 78.0–100.0)
MONO ABS: 0.5 10*3/uL (ref 0.1–1.0)
Monocytes Relative: 7.2 % (ref 3.0–12.0)
NEUTROS ABS: 2.8 10*3/uL (ref 1.4–7.7)
Neutrophils Relative %: 37.6 % — ABNORMAL LOW (ref 43.0–77.0)
PLATELETS: 392 10*3/uL (ref 150.0–400.0)
RBC: 5.11 Mil/uL (ref 4.22–5.81)
RDW: 13.4 % (ref 11.5–15.5)
WBC: 7.4 10*3/uL (ref 4.0–10.5)

## 2015-09-03 LAB — URINALYSIS, ROUTINE W REFLEX MICROSCOPIC
Bilirubin Urine: NEGATIVE
KETONES UR: NEGATIVE
Leukocytes, UA: NEGATIVE
Nitrite: NEGATIVE
PH: 5.5 (ref 5.0–8.0)
SPECIFIC GRAVITY, URINE: 1.025 (ref 1.000–1.030)
TOTAL PROTEIN, URINE-UPE24: 30 — AB
URINE GLUCOSE: NEGATIVE
UROBILINOGEN UA: 0.2 (ref 0.0–1.0)

## 2015-09-03 LAB — LIPID PANEL
CHOL/HDL RATIO: 14
Cholesterol: 368 mg/dL — ABNORMAL HIGH (ref 0–200)
HDL: 26 mg/dL — AB (ref >39.00)
Triglycerides: 1775 mg/dL — ABNORMAL HIGH (ref 0.0–149.0)

## 2015-09-03 LAB — PSA: PSA: 0.59 ng/mL (ref 0.10–4.00)

## 2015-09-03 LAB — LDL CHOLESTEROL, DIRECT: Direct LDL: 66 mg/dL

## 2015-09-03 LAB — COMPREHENSIVE METABOLIC PANEL
ALK PHOS: 40 U/L (ref 39–117)
ALT: 14 U/L (ref 0–53)
AST: 20 U/L (ref 0–37)
Albumin: 4.4 g/dL (ref 3.5–5.2)
BILIRUBIN TOTAL: 0.6 mg/dL (ref 0.2–1.2)
BUN: 10 mg/dL (ref 6–23)
CO2: 28 meq/L (ref 19–32)
CREATININE: 1.16 mg/dL (ref 0.40–1.50)
Calcium: 9.9 mg/dL (ref 8.4–10.5)
Chloride: 100 mEq/L (ref 96–112)
GFR: 85.81 mL/min (ref >60.00)
GLUCOSE: 136 mg/dL — AB (ref 70–99)
Potassium: 4 mEq/L (ref 3.5–5.1)
SODIUM: 137 meq/L (ref 135–145)
TOTAL PROTEIN: 7.9 g/dL (ref 6.0–8.3)

## 2015-09-03 LAB — MICROALBUMIN / CREATININE URINE RATIO
CREATININE, U: 243.8 mg/dL
MICROALB/CREAT RATIO: 11.5 mg/g (ref 0.0–30.0)
Microalb, Ur: 28.1 mg/dL — ABNORMAL HIGH (ref 0.0–1.9)

## 2015-09-03 LAB — HEMOGLOBIN A1C: Hgb A1c MFr Bld: 7.8 % — ABNORMAL HIGH (ref 4.6–6.5)

## 2015-09-03 LAB — TSH: TSH: 1.21 u[IU]/mL (ref 0.35–4.50)

## 2015-09-03 MED ORDER — OLMESARTAN MEDOXOMIL 20 MG PO TABS: 20.0000 mg | ORAL_TABLET | Freq: Every day | ORAL | Status: DC

## 2015-09-03 MED ORDER — ICOSAPENT ETHYL 0.5 G PO CAPS: 2.0000 | ORAL_CAPSULE | Freq: Two times a day (BID) | ORAL | Status: DC

## 2015-09-03 NOTE — Progress Notes (Signed)
Subjective:  Patient ID: Tim Wilson, male    DOB: [birth year]  Age: 49 y.o. MRN: [id number]  CC: Hyperlipidemia; Hypertension; and Diabetes   HPI Tim Wilson presents for a CPX and f/up on DM2, HTN, and hyperlipidemia. He feels well and offers no complaints.  Outpatient Prescriptions Prior to Visit  Medication Sig Dispense Refill  . aspirin 325 MG EC tablet Take 325 mg by mouth daily.      . Capsicum, Cayenne, (CAYENNE PEPPER) 450 MG CAPS Take 1 capsule by mouth 3 (three) times a week.      Marland Kitchen glucose blood (ONETOUCH VERIO) test strip 1 each by Other route 2 (two) times daily. And lancets 2/day 250.01 100 each 12  . Omega-3 Fatty Acids (FISH OIL) 1000 MG CAPS Take 4 capsules by mouth daily.      . pioglitazone-metformin (ACTOPLUS MET) 15-850 MG per tablet Take 1 tablet by mouth 3 (three) times daily. 900 tablet 11  . rosuvastatin (CRESTOR) 20 MG tablet Take 1 tablet (20 mg total) by mouth daily. 90 tablet 2  . famotidine (PEPCID) 20 MG tablet Take 1 tablet (20 mg total) by mouth 2 (two) times daily. 30 tablet 0  . Blood Glucose Monitoring Suppl (ONE TOUCH ULTRA 2) W/DEVICE KIT Use two times a day to test blood sugar     . fenofibrate (TRICOR) 145 MG tablet Take 1 tablet (145 mg total) by mouth daily. (Patient not taking: Reported on 09/03/2015) 90 tablet 2  . sitaGLIPtin (JANUVIA) 100 MG tablet Take 1 tablet (100 mg total) by mouth daily. (Patient not taking: Reported on 09/03/2015) 30 tablet 11  . HYDROcodone-homatropine (HYCODAN) 5-1.5 MG/5ML syrup Take 5 mLs by mouth every 8 (eight) hours as needed for cough. 120 mL 0  . olmesartan (BENICAR) 20 MG tablet Take 1 tablet (20 mg total) by mouth daily. (Patient not taking: Reported on 09/03/2015) 90 tablet 3  . omega-3 acid ethyl esters (LOVAZA) 1 G capsule Take 2 capsules (2 g total) by mouth 2 (two) times daily. 120 capsule 11   No facility-administered medications prior to visit.    ROS Review of Systems  Constitutional:  Negative.  Negative for fever, chills, diaphoresis, appetite change and fatigue.  HENT: Negative.   Eyes: Negative.   Respiratory: Negative.  Negative for cough, choking, chest tightness, shortness of breath and stridor.   Cardiovascular: Negative.  Negative for chest pain, palpitations and leg swelling.  Gastrointestinal: Negative.  Negative for nausea, vomiting, abdominal pain, diarrhea, constipation and blood in stool.  Endocrine: Negative.  Negative for polydipsia, polyphagia and polyuria.  Genitourinary: Negative.  Negative for dysuria, urgency, hematuria, penile swelling, scrotal swelling, difficulty urinating and testicular pain.  Musculoskeletal: Negative.  Negative for myalgias, back pain, arthralgias and neck pain.  Skin: Negative.  Negative for color change and rash.  Allergic/Immunologic: Negative.   Neurological: Negative.   Hematological: Negative.  Negative for adenopathy. Does not bruise/bleed easily.  Psychiatric/Behavioral: Negative.     Objective:  BP 124/88 mmHg  Pulse 78  Temp(Src) 98.3 F (36.8 C) (Oral)  Ht '5\' 8"'  (1.727 m)  Wt 217 lb (98.431 kg)  BMI 33.00 kg/m2  SpO2 96%  BP Readings from Last 3 Encounters:  09/03/15 124/88  04/23/15 154/88  02/14/15 120/86    Wt Readings from Last 3 Encounters:  09/03/15 217 lb (98.431 kg)  04/23/15 210 lb (95.255 kg)  02/14/15 212 lb (96.163 kg)    Physical Exam  Constitutional: He is oriented to  person, place, and time. He appears well-developed and well-nourished. No distress.  HENT:  Head: Normocephalic and atraumatic.  Mouth/Throat: Oropharynx is clear and moist. No oropharyngeal exudate.  Eyes: Conjunctivae are normal. Right eye exhibits no discharge. Left eye exhibits no discharge. No scleral icterus.  Neck: Normal range of motion. Neck supple. No JVD present. No tracheal deviation present. No thyromegaly present.  Cardiovascular: Normal rate, regular rhythm, normal heart sounds and intact distal pulses.   Exam reveals no gallop and no friction rub.   No murmur heard. Pulmonary/Chest: Effort normal and breath sounds normal. No stridor. No respiratory distress. He has no wheezes. He has no rales. He exhibits no tenderness.  Abdominal: Soft. Bowel sounds are normal. He exhibits no distension and no mass. There is no tenderness. There is no rebound and no guarding. Hernia confirmed negative in the right inguinal area and confirmed negative in the left inguinal area.  Genitourinary: Rectum normal, prostate normal, testes normal and penis normal. Rectal exam shows no external hemorrhoid, no internal hemorrhoid, no fissure, no mass, no tenderness and anal tone normal. Guaiac negative stool. Prostate is not enlarged and not tender. Right testis shows no mass, no swelling and no tenderness. Right testis is descended. Left testis shows no mass, no swelling and no tenderness. Left testis is descended. Circumcised. No penile erythema or penile tenderness. No discharge found.  Musculoskeletal: Normal range of motion. He exhibits no edema or tenderness.  Lymphadenopathy:    He has no cervical adenopathy.       Right: No inguinal adenopathy present.       Left: No inguinal adenopathy present.  Neurological: He is oriented to person, place, and time.  Skin: Skin is warm and dry. No rash noted. He is not diaphoretic. No erythema. No pallor.  Psychiatric: He has a normal mood and affect. His behavior is normal. Judgment and thought content normal.  Vitals reviewed.   Lab Results  Component Value Date   WBC 7.4 09/03/2015   HGB 14.5 09/03/2015   HCT 41.3 09/03/2015   PLT 392.0 09/03/2015   GLUCOSE 136* 09/03/2015   CHOL 368* 09/03/2015   TRIG * 09/03/2015    1775.0 Triglyceride is over 400; calculations on Lipids are invalid.   HDL 26.00* 09/03/2015   LDLDIRECT 66.0 09/03/2015   LDLCALC NOT CALC 08/01/2014   ALT 14 09/03/2015   AST 20 09/03/2015   NA 137 09/03/2015   K 4.0 09/03/2015   CL 100 09/03/2015    CREATININE 1.16 09/03/2015   BUN 10 09/03/2015   CO2 28 09/03/2015   TSH 1.21 09/03/2015   PSA 0.59 09/03/2015   HGBA1C 7.8* 09/03/2015   MICROALBUR 28.1* 09/03/2015    No results found.  Assessment & Plan:   Freeman was seen today for hyperlipidemia, hypertension and diabetes.  Diagnoses and all orders for this visit:  Hyperlipidemia with target LDL less than 100- he has achieved his LDL goal and is doing well on the statin  Type 2 diabetes mellitus with complication, with long-term current use of insulin ([place])- his A1c is up to 7.8%, I've asked him to follow-up with his endocrinologist to get better blood sugar control. -     Microalbumin / creatinine urine ratio; Future -     Hemoglobin A1c; Future -     olmesartan (BENICAR) 20 MG tablet; Take 1 tablet (20 mg total) by mouth daily. -     Ambulatory referral to Endocrinology  MICROALBUMINURIA- this is stable, will cont the  ARB, he will f/up with ENDO to discuss better blood sugar control  Essential hypertension -     olmesartan (BENICAR) 20 MG tablet; Take 1 tablet (20 mg total) by mouth daily.  Routine general medical examination at a health care facility- vaccines were reviewed and updated, exam completed, labs ordered and reviewed, he was given patient education material. -     Lipid panel; Future -     PSA; Future -     TSH; Future -     Comprehensive metabolic panel; Future -     CBC with Differential/Platelet; Future -     Urinalysis, Routine w reflex microscopic (not at [facility]); Future  Essential hypertension, benign- his BP is well controlled, lytes and renal function are stable -     olmesartan (BENICAR) 20 MG tablet; Take 1 tablet (20 mg total) by mouth daily.  HYPERTRIGLYCERIDEMIA- his triglycerides are over 1700, he agrees to be more compliant with his fenofibrate therapy and will also add Vascepa -     Icosapent Ethyl (VASCEPA) 0.5 G CAPS; Take 2 capsules by mouth 2 (two) times daily. -     Ambulatory referral  to Endocrinology   I have discontinued Mr. Burchfield's omega-3 acid ethyl esters, famotidine, and HYDROcodone-homatropine. I am also having him start on Icosapent Ethyl. Additionally, I am having him maintain his aspirin, Cayenne Pepper, Fish Oil, ONE TOUCH ULTRA 2, glucose blood, pioglitazone-metformin, sitaGLIPtin, rosuvastatin, fenofibrate, and olmesartan.  Meds ordered this encounter  Medications  . olmesartan (BENICAR) 20 MG tablet    Sig: Take 1 tablet (20 mg total) by mouth daily.    Dispense:  90 tablet    Refill:  3  . Icosapent Ethyl (VASCEPA) 0.5 G CAPS    Sig: Take 2 capsules by mouth 2 (two) times daily.    Dispense:  240 capsule    Refill:  11     Follow-up: Return in about 6 months (around 03/02/2016).  Scarlette Calico, MD

## 2015-09-03 NOTE — Patient Instructions (Signed)

## 2015-09-03 NOTE — Progress Notes (Signed)
Pre visit review using our clinic review tool, if applicable. No additional management support is needed unless otherwise documented below in the visit note. 

## 2015-09-04 ENCOUNTER — Other Ambulatory Visit: Payer: Self-pay

## 2015-09-16 ENCOUNTER — Other Ambulatory Visit: Payer: Self-pay | Admitting: Endocrinology

## 2015-09-19 ENCOUNTER — Encounter: Payer: Self-pay | Admitting: Endocrinology

## 2015-09-19 ENCOUNTER — Ambulatory Visit (INDEPENDENT_AMBULATORY_CARE_PROVIDER_SITE_OTHER): Payer: Federal, State, Local not specified - PPO | Admitting: Endocrinology

## 2015-09-19 VITALS — BP 122/87 | HR 77 | Temp 98.3°F | Ht 68.0 in | Wt 213.0 lb

## 2015-09-19 DIAGNOSIS — E781 Pure hyperglyceridemia: Secondary | ICD-10-CM | POA: Diagnosis not present

## 2015-09-19 DIAGNOSIS — E118 Type 2 diabetes mellitus with unspecified complications: Secondary | ICD-10-CM

## 2015-09-19 LAB — LIPID PANEL
CHOL/HDL RATIO: 4
CHOLESTEROL: 138 mg/dL (ref 0–200)
HDL: 34 mg/dL — AB (ref >39.00)
LDL Cholesterol: 77 mg/dL (ref 0–99)
NonHDL: 104.47
TRIGLYCERIDES: 135 mg/dL (ref 0.0–149.0)
VLDL: 27 mg/dL (ref 0.0–40.0)

## 2015-09-19 NOTE — Progress Notes (Signed)
Subjective:    Patient ID: Tim Wilson, male    DOB: 06/26/1966, 49 y.o.   MRN: [id number]  HPI Pt returns for f/u of diabetes mellitus: DM type: 2 Dx'ed: 2458 Complications: none Therapy: 3 oral meds DKA: never Severe hypoglycemia: never Pancreatitis: never Other: he took insulin in 2012, and 2014-2015; insurance refused invokana.  Interval history: no cbg record, but states cbg's vary from 90-180.  It is in general higher as the day goes on.  pt states he feels well in general.  He sometimes misses meds for dyslipidemia, but never misses DM meds.  He says diet is much better over the past few weeks.  He is fasting today.  He seldom drinks EtOH.   Past Medical History  Diagnosis Date  . Hyperlipidemia   . Diabetes mellitus   . Hypertension     No past surgical history on file.  Social History   Social History  . Marital Status: Married    Spouse Name: N/A  . Number of Children: N/A  . Years of Education: N/A   Occupational History  . 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 Smoking status: Never Smoker   . Smokeless tobacco: Never Used  . Alcohol Use: 1.2 oz/week    2 Cans of beer per week     Comment: on occasion, not in excess  . Drug Use: No  . Sexual Activity: Yes   Other Topics Concern  . Not on file   Social History Narrative   Regular exercise-yes    Current Outpatient Prescriptions on File Prior to Visit  Medication Sig Dispense Refill  . aspirin 325 MG EC tablet Take 325 mg by mouth daily.      . Blood Glucose Monitoring Suppl (ONE TOUCH ULTRA 2) W/DEVICE KIT Use two times a day to test blood sugar     . Capsicum, Cayenne, (CAYENNE PEPPER) 450 MG CAPS Take 1 capsule by mouth 3 (three) times a week.      . fenofibrate (TRICOR) 145 MG tablet Take 1 tablet (145 mg total) by mouth daily. 90 tablet 2  . glucose blood (ONETOUCH VERIO) test strip 1 each by Other route 2 (two) times daily. And  lancets 2/day 250.01 100 each 12  . olmesartan (BENICAR) 20 MG tablet Take 1 tablet (20 mg total) by mouth daily. 90 tablet 3  . Omega-3 Fatty Acids (FISH OIL) 1000 MG CAPS Take 4 capsules by mouth daily.      . pioglitazone-metformin (ACTOPLUS MET) 15-850 MG tablet TAKE 1 TABLET BY MOUTH 3 TIMES DAILY. 900 tablet 11  . rosuvastatin (CRESTOR) 20 MG tablet Take 1 tablet (20 mg total) by mouth daily. 90 tablet 2  . Icosapent Ethyl (VASCEPA) 0.5 G CAPS Take 2 capsules by mouth 2 (two) times daily. (Patient not taking: Reported on 09/19/2015) 240 capsule 11  . sitaGLIPtin (JANUVIA) 100 MG tablet Take 1 tablet (100 mg total) by mouth daily. (Patient not taking: Reported on 09/19/2015) 30 tablet 11  . [DISCONTINUED] colchicine 0.6 MG tablet Take 1 tablet (0.6 mg total) by mouth 2 (two) times daily. 60 tablet 2   No current facility-administered medications on file prior to visit.    Allergies  Allergen Reactions  . Ramipril     cough    Family History  Problem Relation Age of Onset  . Diabetes Mother   . Heart disease Father   .  Heart disease Brother     BP 122/87 mmHg  Pulse 77  Temp(Src) 98.3 F (36.8 C) (Oral)  Ht '5\' 8"'  (1.727 m)  Wt 213 lb (96.616 kg)  BMI 32.39 kg/m2  SpO2 96%    Review of Systems He has lost a few lbs.      Objective:   Physical Exam VITAL SIGNS:  See vs page GENERAL: no distress Pulses: dorsalis pedis intact bilat.   MSK: no deformity of the feet CV: no leg edema Skin:  no ulcer on the feet.  normal color and temp on the feet. Neuro: sensation is intact to touch on the feet.      Lab Results  Component Value Date   HGBA1C 7.8* 09/03/2015      Assessment & Plan:  DM: Needs increased rx, if it can be done with a regimen that avoids or minimizes hypoglycemia.  I advised addition of another DM med, but he hesitates.    Patient is advised the following: Patient Instructions  check your blood sugar twice a day.  vary the time of day when you  check, between before the 3 meals, and at bedtime.  also check if you have symptoms of your blood sugar being too high or too low.  please keep a record of the readings and bring it to your next appointment here.  You can write it on any piece of paper.  please call us sooner if your blood sugar goes below 70, or if you have a lot of readings over 200.   Please come back for a follow-up appointment in 3 months.  blood tests are requested for you today.  We'll let you know about the results.  We'll convert the "fructosamine" to an a1c number.  If it is still high, you should add "invokana."

## 2015-09-19 NOTE — Patient Instructions (Addendum)
check your blood sugar twice a day.  vary the time of day when you check, between before the 3 meals, and at bedtime.  also check if you have symptoms of your blood sugar being too high or too low.  please keep a record of the readings and bring it to your next appointment here.  You can write it on any piece of paper.  please call us sooner if your blood sugar goes below 70, or if you have a lot of readings over 200.   Please come back for a follow-up appointment in 3 months.  blood tests are requested for you today.  We'll let you know about the results.  We'll convert the "fructosamine" to an a1c number.  If it is still high, you should add "invokana."

## 2015-09-22 LAB — FRUCTOSAMINE: Fructosamine: 266 umol/L (ref 190–270)

## 2015-10-09 MED FILL — OLMESARTAN MEDOXOMIL 20 MG: 20 | 60 days supply | Qty: 60 | Fill #1

## 2015-10-23 MED FILL — PIOGLIT-METFORMIN 15-850: 15-850 | 30 days supply | Qty: 90 | Fill #1

## 2015-12-06 DIAGNOSIS — Z7689 Persons encountering health services in other specified circumstances: Secondary | ICD-10-CM

## 2015-12-11 MED FILL — PIOGLIT-METFORMIN 15-850: 15-850 | 30 days supply | Qty: 90 | Fill #2

## 2015-12-17 NOTE — Progress Notes (Signed)
Subjective:    Patient ID: Tim Wilson, male    DOB: April 28, 1966, 50 y.o.   MRN: [id number]  HPI Pt returns for f/u of diabetes mellitus: DM type: 2 Dx'ed: 4403 Complications: none Therapy: 2 oral meds DKA: never Severe hypoglycemia: never Pancreatitis: never.   Other: he took insulin in 2012, and 2014-2015; insurance refused invokana.  Interval history: he does not take [place].  He says cbg's are well-controlled.   Past Medical History  Diagnosis Date  . Hyperlipidemia   . Diabetes mellitus   . Hypertension     No past surgical history on file.  Social History   Social History  . Marital Status: Married    Spouse Name: N/A  . Number of Children: N/A  . Years of Education: N/A   Occupational History  . 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 Smoking status: Never Smoker   . Smokeless tobacco: Never Used  . Alcohol Use: 1.2 oz/week    2 Cans of beer per week     Comment: on occasion, not in excess  . Drug Use: No  . Sexual Activity: Yes   Other Topics Concern  . Not on file   Social History Narrative   Regular exercise-yes    Current Outpatient Prescriptions on File Prior to Visit  Medication Sig Dispense Refill  . aspirin 325 MG EC tablet Take 325 mg by mouth daily.      . Blood Glucose Monitoring Suppl (ONE TOUCH ULTRA 2) W/DEVICE KIT Use two times a day to test blood sugar     . Capsicum, Cayenne, (CAYENNE PEPPER) 450 MG CAPS Take 1 capsule by mouth 3 (three) times a week.      . fenofibrate (TRICOR) 145 MG tablet Take 1 tablet (145 mg total) by mouth daily. 90 tablet 2  . glucose blood (ONETOUCH VERIO) test strip 1 each by Other route 2 (two) times daily. And lancets 2/day 250.01 100 each 12  . olmesartan (BENICAR) 20 MG tablet Take 1 tablet (20 mg total) by mouth daily. 90 tablet 3  . Omega-3 Fatty Acids (FISH OIL) 1000 MG CAPS Take 4 capsules by mouth daily.      . pioglitazone-metformin  (ACTOPLUS MET) 15-850 MG tablet TAKE 1 TABLET BY MOUTH 3 TIMES DAILY. 900 tablet 11  . rosuvastatin (CRESTOR) 20 MG tablet Take 1 tablet (20 mg total) by mouth daily. 90 tablet 2  . Icosapent Ethyl (VASCEPA) 0.5 G CAPS Take 2 capsules by mouth 2 (two) times daily. (Patient not taking: Reported on 09/19/2015) 240 capsule 11  . [DISCONTINUED] colchicine 0.6 MG tablet Take 1 tablet (0.6 mg total) by mouth 2 (two) times daily. 60 tablet 2   No current facility-administered medications on file prior to visit.    Allergies  Allergen Reactions  . Ramipril     cough    Family History  Problem Relation Age of Onset  . Diabetes Mother   . Heart disease Father   . Heart disease Brother     BP 120/74 mmHg  Pulse 68  Temp(Src) 97.9 F (36.6 C) (Oral)  Ht _0  (1.727 m)  Wt 217 lb 8 oz (98.657 kg)  BMI 33.08 kg/m2  SpO2 96%  Review of Systems No weight change.      Objective:   Physical Exam  VITAL SIGNS:  See vs page GENERAL: no distress. Pulses: dorsalis pedis intact bilat.  MSK: no deformity of the feet CV: no leg edema Skin:  no ulcer on the feet.  normal color and temp on the feet. Neuro: sensation is intact to touch on the feet.   A1c=6.9%    Assessment & Plan:  DM: he needs increased rx, if it can be done with a regimen that avoids or minimizes hypoglycemia.    Patient is advised the following: Patient Instructions  check your blood sugar twice a day.  vary the time of day when you check, between before the 3 meals, and at bedtime.  also check if you have symptoms of your blood sugar being too high or too low.  please keep a record of the readings and bring it to your next appointment here.  You can write it on any piece of paper.  please call us sooner if your blood sugar goes below 70, or if you have a lot of readings over 200.   i have sent a prescription to your pharmacy, to add invokana Please come back for a follow-up appointment in 4 months.

## 2015-12-17 NOTE — Patient Instructions (Addendum)
check your blood sugar twice a day.  vary the time of day when you check, between before the 3 meals, and at bedtime.  also check if you have symptoms of your blood sugar being too high or too low.  please keep a record of the readings and bring it to your next appointment here.  You can write it on any piece of paper.  please call us sooner if your blood sugar goes below 70, or if you have a lot of readings over 200.   i have sent a prescription to your pharmacy, to add invokana Please come back for a follow-up appointment in 4 months.

## 2015-12-18 ENCOUNTER — Encounter: Payer: Self-pay | Admitting: Endocrinology

## 2015-12-18 ENCOUNTER — Ambulatory Visit (INDEPENDENT_AMBULATORY_CARE_PROVIDER_SITE_OTHER): Payer: Federal, State, Local not specified - PPO | Admitting: Endocrinology

## 2015-12-18 VITALS — BP 120/74 | HR 68 | Temp 97.9°F | Ht 68.0 in | Wt 217.5 lb

## 2015-12-18 DIAGNOSIS — E118 Type 2 diabetes mellitus with unspecified complications: Secondary | ICD-10-CM | POA: Diagnosis not present

## 2015-12-18 LAB — POCT GLYCOSYLATED HEMOGLOBIN (HGB A1C): HEMOGLOBIN A1C: 6.9

## 2015-12-18 LAB — MICROALBUMIN / CREATININE URINE RATIO
CREATININE, U: 216.4 mg/dL
MICROALB UR: 2.4 mg/dL — AB (ref 0.0–1.9)
Microalb Creat Ratio: 1.1 mg/g (ref 0.0–30.0)

## 2015-12-18 MED ORDER — CANAGLIFLOZIN 100 MG PO TABS: 100.0000 mg | ORAL_TABLET | Freq: Every day | ORAL | Status: DC

## 2015-12-25 ENCOUNTER — Telehealth: Payer: Self-pay | Admitting: Endocrinology

## 2015-12-25 NOTE — Telephone Encounter (Signed)
Pa was received, completed and faxed on 12/24/2015. PA refaxed the pt's insurance.

## 2015-12-25 NOTE — Telephone Encounter (Signed)
[facility]  Did we receive the PA form for the pts invokana?   [phone]

## 2016-01-01 ENCOUNTER — Telehealth: Payer: Self-pay | Admitting: Endocrinology

## 2016-01-01 NOTE — Telephone Encounter (Signed)
Formulary placed on your desk to review in the morning. Several alternatives were listed on the formulary.

## 2016-01-01 NOTE — Telephone Encounter (Signed)
What is alternative? 

## 2016-01-01 NOTE — Telephone Encounter (Signed)
See note below. Rx is not covered under the pt's insurance plan the PA we sent got denied.  Please advise, Thanks!

## 2016-01-01 NOTE — Telephone Encounter (Signed)
Tim Wilson calling about patient Prior Auth for medication invokana phone # [phone]

## 2016-01-02 NOTE — Telephone Encounter (Signed)
I contacted the pt advised of note below. Pt will contine on the pioglitazone-metformin medication at this time.

## 2016-01-02 NOTE — Telephone Encounter (Signed)
please call patient: This is the best option, per insurance. Let's hold off on it for now. i'll see you next time.

## 2016-01-05 ENCOUNTER — Telehealth: Payer: Self-pay | Admitting: Endocrinology

## 2016-01-05 NOTE — Telephone Encounter (Signed)
[facility] calling for status of the PA invokana

## 2016-01-05 NOTE — Telephone Encounter (Signed)
I contacted the pharmacy and advised Invokana has been D/c.

## 2016-03-17 LAB — HM DIABETES EYE EXAM

## 2016-04-20 ENCOUNTER — Ambulatory Visit (INDEPENDENT_AMBULATORY_CARE_PROVIDER_SITE_OTHER): Payer: Federal, State, Local not specified - PPO | Admitting: Endocrinology

## 2016-04-20 ENCOUNTER — Encounter: Payer: Self-pay | Admitting: Endocrinology

## 2016-04-20 VITALS — BP 147/91 | HR 75 | Temp 98.8°F | Ht 68.0 in | Wt 211.0 lb

## 2016-04-20 DIAGNOSIS — E1310 Other specified diabetes mellitus with ketoacidosis without coma: Secondary | ICD-10-CM

## 2016-04-20 LAB — POCT GLYCOSYLATED HEMOGLOBIN (HGB A1C): Hemoglobin A1C: 7.2

## 2016-04-20 MED ORDER — LINAGLIPTIN-METFORMIN HCL ER 2.5-1000 MG PO TB24: 2.0000 | ORAL_TABLET | Freq: Every day | ORAL | Status: DC

## 2016-04-20 MED ORDER — PIOGLITAZONE HCL 45 MG PO TABS: 45.0000 mg | ORAL_TABLET | Freq: Every day | ORAL | Status: DC

## 2016-04-20 NOTE — Patient Instructions (Addendum)
check your blood sugar twice a day.  vary the time of day when you check, between before the 3 meals, and at bedtime.  also check if you have symptoms of your blood sugar being too high or too low.  please keep a record of the readings and bring it to your next appointment here.  You can write it on any piece of paper.  please call us sooner if your blood sugar goes below 70, or if you have a lot of readings over 200.   Please change your meds to those listed below.  I have sent prescriptions to your pharmacy Please come back for a follow-up appointment in 3 months.

## 2016-04-20 NOTE — Progress Notes (Signed)
Subjective:    Patient ID: Tim Wilson, male    DOB: [birth year], 50 y.o.   MRN: [id number]  HPI Pt returns for f/u of diabetes mellitus: DM type: 2 Dx'ed: 9741 Complications: none Therapy: 3 oral meds DKA: never Severe hypoglycemia: never Pancreatitis: never.   Other: he took insulin in 2012, and 2014-2015; insurance twice refused invokana.   Interval history: he does not take [place].  He says cbg's are well-controlled.  pt states he feels well in general.   Past Medical History  Diagnosis Date  . Hyperlipidemia   . Diabetes mellitus   . Hypertension     No past surgical history on file.  Social History   Social History  . Marital Status: Married    Spouse Name: N/A  . Number of Children: N/A  . Years of Education: N/A   Occupational History  . Engineer, drilling basketball at local [employer] History Main Topics  . Smoking status: Never Smoker   . Smokeless tobacco: Never Used  . Alcohol Use: 1.2 oz/week    2 Cans of beer per week     Comment: on occasion, not in excess  . Drug Use: No  . Sexual Activity: Yes   Other Topics Concern  . Not on file   Social History Narrative   Regular exercise-yes    Current Outpatient Prescriptions on File Prior to Visit  Medication Sig Dispense Refill  . aspirin 325 MG EC tablet Take 325 mg by mouth daily.      . Blood Glucose Monitoring Suppl (ONE TOUCH ULTRA 2) W/DEVICE KIT Use two times a day to test blood sugar     . Capsicum, Cayenne, (CAYENNE PEPPER) 450 MG CAPS Take 1 capsule by mouth 3 (three) times a week.      . fenofibrate (TRICOR) 145 MG tablet Take 1 tablet (145 mg total) by mouth daily. 90 tablet 2  . glucose blood (ONETOUCH VERIO) test strip 1 each by Other route 2 (two) times daily. And lancets 2/day 250.01 100 each 12  . olmesartan (BENICAR) 20 MG tablet Take 1 tablet (20 mg total) by mouth daily. 90 tablet 3  . Omega-3 Fatty Acids (FISH OIL) 1000 MG CAPS Take 4 capsules by  mouth daily.      . rosuvastatin (CRESTOR) 20 MG tablet Take 1 tablet (20 mg total) by mouth daily. 90 tablet 2  . Icosapent Ethyl (VASCEPA) 0.5 G CAPS Take 2 capsules by mouth 2 (two) times daily. (Patient not taking: Reported on 04/20/2016) 240 capsule 11  . [DISCONTINUED] colchicine 0.6 MG tablet Take 1 tablet (0.6 mg total) by mouth 2 (two) times daily. 60 tablet 2   No current facility-administered medications on file prior to visit.    Allergies  Allergen Reactions  . Ramipril     cough    Family History  Problem Relation Age of Onset  . Diabetes Mother   . Heart disease Father   . Heart disease Brother     BP 147/91 mmHg  Pulse 75  Temp(Src) 98.8 F (37.1 C) (Oral)  Ht '5\' 8"'  (1.727 m)  Wt 211 lb (95.709 kg)  BMI 32.09 kg/m2  SpO2 95%  Review of Systems No weight change    Objective:   Physical Exam VITAL SIGNS:  See vs page GENERAL: no distress Pulses: dorsalis pedis intact bilat.   MSK: no deformity of the feet CV: no leg edema Skin:  no  ulcer on the feet.  normal color and temp on the feet. Neuro: sensation is intact to touch on the feet   A1c=7.2% Lab Results  Component Value Date   CREATININE 1.16 09/03/2015   BUN 10 09/03/2015   NA 137 09/03/2015   K 4.0 09/03/2015   CL 100 09/03/2015   CO2 28 09/03/2015   Lab Results  Component Value Date   CHOL 138 09/19/2015   HDL 34.00* 09/19/2015   LDLCALC 77 09/19/2015   LDLDIRECT 66.0 09/03/2015   TRIG 135.0 09/19/2015   CHOLHDL 4 09/19/2015      Assessment & Plan:  DM: worse.  I rx'ed the below meds which I believe ins will cover.  I asked pt to call if not.   Dyslipidemia: he may not need fenofibrate, given he takes pioglitizone.  He could have a trial off, but I told pt that is up to Dr Ronnald Ramp.    Patient is advised the following: Patient Instructions  check your blood sugar twice a day.  vary the time of day when you check, between before the 3 meals, and at bedtime.  also check if you have  symptoms of your blood sugar being too high or too low.  please keep a record of the readings and bring it to your next appointment here.  You can write it on any piece of paper.  please call us sooner if your blood sugar goes below 70, or if you have a lot of readings over 200.   Please change your meds to those listed below.  I have sent prescriptions to your pharmacy Please come back for a follow-up appointment in 3 months.    Renato Shin, MD

## 2016-04-28 MED FILL — PIOGLITAZONE HCL 45 MG TAB: 45 | 30 days supply | Qty: 30 | Fill #0

## 2016-07-21 ENCOUNTER — Ambulatory Visit: Payer: Federal, State, Local not specified - PPO | Admitting: Endocrinology

## 2016-07-23 ENCOUNTER — Ambulatory Visit (INDEPENDENT_AMBULATORY_CARE_PROVIDER_SITE_OTHER): Payer: Federal, State, Local not specified - PPO | Admitting: Endocrinology

## 2016-07-23 ENCOUNTER — Encounter: Payer: Self-pay | Admitting: Endocrinology

## 2016-07-23 VITALS — BP 124/80 | HR 73 | Ht 68.0 in | Wt 213.0 lb

## 2016-07-23 DIAGNOSIS — E118 Type 2 diabetes mellitus with unspecified complications: Secondary | ICD-10-CM

## 2016-07-23 DIAGNOSIS — Z23 Encounter for immunization: Secondary | ICD-10-CM

## 2016-07-23 LAB — POCT GLYCOSYLATED HEMOGLOBIN (HGB A1C): Hemoglobin A1C: 9.2

## 2016-07-23 MED ORDER — LINAGLIPTIN-METFORMIN HCL ER 2.5-1000 MG PO TB24: 2.0000 | ORAL_TABLET | Freq: Every day | ORAL | 11 refills | Status: DC

## 2016-07-23 NOTE — Progress Notes (Signed)
Subjective:    Patient ID: Tim Wilson, male    DOB: [birth year], 50 y.o.   MRN: [id number]  HPI Pt returns for f/u of diabetes mellitus: DM type: 2 Dx'ed: 8372 Complications: none Therapy: 3 oral meds DKA: never Severe hypoglycemia: never.  Pancreatitis: never.   Other: he took insulin in 2012, and 2014-2015; insurance twice refused invokana.  Interval history: He says cbg's are well-controlled.  pt states he feels well in general.  He says he does not take jentadueto.  He says he cannot take metformin (even -XR) due to diarrhea.   Past Medical History:  Diagnosis Date  . Diabetes mellitus   . Hyperlipidemia   . Hypertension     No past surgical history on file.  Social History   Social History  . Marital status: Married    Spouse name: N/A  . Number of children: N/A  . Years of education: N/A   Occupational History  . 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 Smoking status: Never Smoker  . Smokeless tobacco: Never Used  . Alcohol use 1.2 oz/week    2 Cans of beer per week     Comment: on occasion, not in excess  . Drug use: No  . Sexual activity: Yes   Other Topics Concern  . Not on file   Social History Narrative   Regular exercise-yes    Current Outpatient Prescriptions on File Prior to Visit  Medication Sig Dispense Refill  . aspirin 325 MG EC tablet Take 325 mg by mouth daily.      . Blood Glucose Monitoring Suppl (ONE TOUCH ULTRA 2) W/DEVICE KIT Use two times a day to test blood sugar     . Capsicum, Cayenne, (CAYENNE PEPPER) 450 MG CAPS Take 1 capsule by mouth 3 (three) times a week.      . fenofibrate (TRICOR) 145 MG tablet Take 1 tablet (145 mg total) by mouth daily. 90 tablet 2  . glucose blood (ONETOUCH VERIO) test strip 1 each by Other route 2 (two) times daily. And lancets 2/day 250.01 100 each 12  . olmesartan (BENICAR) 20 MG tablet Take 1 tablet (20 mg total)  by mouth daily. 90 tablet 3  . Omega-3 Fatty Acids (FISH OIL) 1000 MG CAPS Take 4 capsules by mouth daily.      . pioglitazone (ACTOS) 45 MG tablet Take 1 tablet (45 mg total) by mouth daily. 30 tablet 11  . rosuvastatin (CRESTOR) 20 MG tablet Take 1 tablet (20 mg total) by mouth daily. 90 tablet 2  . Icosapent Ethyl (VASCEPA) 0.5 G CAPS Take 2 capsules by mouth 2 (two) times daily. (Patient not taking: Reported on 07/23/2016) 240 capsule 11  . [DISCONTINUED] colchicine 0.6 MG tablet Take 1 tablet (0.6 mg total) by mouth 2 (two) times daily. 60 tablet 2   No current facility-administered medications on file prior to visit.     Allergies  Allergen Reactions  . Ramipril     cough    Family History  Problem Relation Age of Onset  . Diabetes Mother   . Heart disease Father   . Heart disease Brother     BP 124/80   Pulse 73   Ht '5\' 8"'  (1.727 m)   Wt 213 lb (96.6 kg)   SpO2 97%   BMI 32.39 kg/m    Review of Systems He denies hypoglycemia.  Objective:   Physical Exam VITAL SIGNS:  See vs page GENERAL: no distress Pulses: dorsalis pedis intact bilat.   MSK: no deformity of the feet CV: no leg edema Skin:  no ulcer on the feet.  normal color and temp on the feet. Neuro: sensation is intact to touch on the feet.    A1c=9.2%    Assessment & Plan:  Type 2 DM: worse.  Noncompliance with meds, new.    Patient is advised the following: Patient Instructions  check your blood sugar twice a day.  vary the time of day when you check, between before the 3 meals, and at bedtime.  also check if you have symptoms of your blood sugar being too high or too low.  please keep a record of the readings and bring it to your next appointment here.  You can write it on any piece of paper.  please call us sooner if your blood sugar goes below 70, or if you have a lot of readings over 200.   I have sent prescriptions to your pharmacy, to add "jentadueto."    Please continue the same  pioglitizone.   Please come back for a follow-up appointment in 3 months.

## 2016-07-23 NOTE — Patient Instructions (Addendum)
check your blood sugar twice a day.  vary the time of day when you check, between before the 3 meals, and at bedtime.  also check if you have symptoms of your blood sugar being too high or too low.  please keep a record of the readings and bring it to your next appointment here.  You can write it on any piece of paper.  please call us sooner if your blood sugar goes below 70, or if you have a lot of readings over 200.   I have sent prescriptions to your pharmacy, to add "jentadueto."    Please continue the same pioglitizone.   Please come back for a follow-up appointment in 3 months.

## 2016-08-02 ENCOUNTER — Encounter: Payer: Federal, State, Local not specified - PPO | Admitting: Internal Medicine

## 2016-09-08 ENCOUNTER — Encounter: Payer: Federal, State, Local not specified - PPO | Admitting: Internal Medicine

## 2016-09-21 ENCOUNTER — Other Ambulatory Visit: Payer: Federal, State, Local not specified - PPO

## 2016-09-21 ENCOUNTER — Ambulatory Visit (INDEPENDENT_AMBULATORY_CARE_PROVIDER_SITE_OTHER): Payer: Federal, State, Local not specified - PPO | Admitting: Internal Medicine

## 2016-09-21 ENCOUNTER — Telehealth: Payer: Self-pay

## 2016-09-21 ENCOUNTER — Encounter: Payer: Self-pay | Admitting: Internal Medicine

## 2016-09-21 ENCOUNTER — Other Ambulatory Visit (INDEPENDENT_AMBULATORY_CARE_PROVIDER_SITE_OTHER): Payer: Federal, State, Local not specified - PPO

## 2016-09-21 VITALS — BP 130/98 | HR 73 | Temp 98.5°F | Resp 16 | Ht 68.0 in | Wt 215.0 lb

## 2016-09-21 DIAGNOSIS — I10 Essential (primary) hypertension: Secondary | ICD-10-CM | POA: Diagnosis not present

## 2016-09-21 DIAGNOSIS — Z794 Long term (current) use of insulin: Secondary | ICD-10-CM | POA: Diagnosis not present

## 2016-09-21 DIAGNOSIS — Z0000 Encounter for general adult medical examination without abnormal findings: Secondary | ICD-10-CM

## 2016-09-21 DIAGNOSIS — Z0001 Encounter for general adult medical examination with abnormal findings: Secondary | ICD-10-CM | POA: Diagnosis not present

## 2016-09-21 DIAGNOSIS — E118 Type 2 diabetes mellitus with unspecified complications: Secondary | ICD-10-CM

## 2016-09-21 DIAGNOSIS — R809 Proteinuria, unspecified: Secondary | ICD-10-CM

## 2016-09-21 DIAGNOSIS — R937 Abnormal findings on diagnostic imaging of other parts of musculoskeletal system: Secondary | ICD-10-CM | POA: Diagnosis not present

## 2016-09-21 DIAGNOSIS — E785 Hyperlipidemia, unspecified: Secondary | ICD-10-CM | POA: Diagnosis not present

## 2016-09-21 DIAGNOSIS — E781 Pure hyperglyceridemia: Secondary | ICD-10-CM

## 2016-09-21 DIAGNOSIS — R0683 Snoring: Secondary | ICD-10-CM | POA: Insufficient documentation

## 2016-09-21 LAB — URINALYSIS, ROUTINE W REFLEX MICROSCOPIC
Bilirubin Urine: NEGATIVE
Ketones, ur: NEGATIVE
Leukocytes, UA: NEGATIVE
Nitrite: NEGATIVE
Specific Gravity, Urine: 1.02
Urine Glucose: NEGATIVE
Urobilinogen, UA: 0.2
WBC, UA: NONE SEEN
pH: 5.5 (ref 5.0–8.0)

## 2016-09-21 LAB — COMPREHENSIVE METABOLIC PANEL
ALBUMIN: 4.4 g/dL (ref 3.6–5.1)
ALK PHOS: 40 U/L (ref 40–115)
ALT: 17 U/L (ref 9–46)
AST: 19 U/L (ref 10–35)
BILIRUBIN TOTAL: 0.4 mg/dL (ref 0.2–1.2)
BUN: 14 mg/dL (ref 7–25)
CALCIUM: 9.6 mg/dL (ref 8.6–10.3)
CO2: 20 mmol/L (ref 20–31)
Chloride: 100 mmol/L (ref 98–110)
Creat: 1.01 mg/dL (ref 0.70–1.33)
GLUCOSE: 192 mg/dL — AB (ref 65–99)
Potassium: 4.7 mmol/L (ref 3.5–5.3)
Sodium: 139 mmol/L (ref 135–146)
TOTAL PROTEIN: 8 g/dL (ref 6.1–8.1)

## 2016-09-21 LAB — CBC WITH DIFFERENTIAL/PLATELET
BASOS ABS: 82 {cells}/uL (ref 0–200)
Basophils Relative: 1 %
EOS PCT: 2 %
Eosinophils Absolute: 164 cells/uL (ref 15–500)
HCT: 35.5 % — ABNORMAL LOW (ref 38.5–50.0)
Hemoglobin: 12.3 g/dL — ABNORMAL LOW (ref 13.2–17.1)
Lymphocytes Relative: 42 %
Lymphs Abs: 3444 cells/uL (ref 850–3900)
MCH: 27.9 pg (ref 27.0–33.0)
MCHC: 34.6 g/dL (ref 32.0–36.0)
MCV: 80.5 fL (ref 80.0–100.0)
MONOS PCT: 8 %
MPV: 11.5 fL (ref 7.5–12.5)
Monocytes Absolute: 656 cells/uL (ref 200–950)
NEUTROS ABS: 3854 {cells}/uL (ref 1500–7800)
NEUTROS PCT: 47 %
PLATELETS: 857 10*3/uL — AB (ref 140–400)
RBC: 4.41 MIL/uL (ref 4.20–5.80)
RDW: 14.5 % (ref 11.0–15.0)
WBC: 8.2 10*3/uL (ref 3.8–10.8)

## 2016-09-21 LAB — LIPID PANEL
CHOLESTEROL: 460 mg/dL — AB (ref <200)
HDL: 20 mg/dL — ABNORMAL LOW (ref >40)
Total CHOL/HDL Ratio: 23 Ratio — ABNORMAL HIGH (ref <5.0)
Triglycerides: 4664 mg/dL — ABNORMAL HIGH (ref <150)

## 2016-09-21 LAB — MICROALBUMIN / CREATININE URINE RATIO
Creatinine,U: 160.7 mg/dL
Microalb Creat Ratio: 12.1 mg/g (ref 0.0–30.0)
Microalb, Ur: 19.4 mg/dL — ABNORMAL HIGH (ref 0.0–1.9)

## 2016-09-21 LAB — TSH: TSH: 1.1 u[IU]/mL (ref 0.35–4.50)

## 2016-09-21 LAB — PSA: PSA: 0.86 ng/mL (ref 0.10–4.00)

## 2016-09-21 LAB — HEMOGLOBIN A1C: HEMOGLOBIN A1C: 8.6 % — AB (ref 4.6–6.5)

## 2016-09-21 MED ORDER — CANAGLIFLOZIN-METFORMIN HCL ER 150-1000 MG PO TB24: 1.0000 | ORAL_TABLET | Freq: Every day | ORAL | 5 refills | Status: DC

## 2016-09-21 MED ORDER — ROSUVASTATIN CALCIUM 20 MG PO TABS: 20.0000 mg | ORAL_TABLET | Freq: Every day | ORAL | 2 refills | Status: DC

## 2016-09-21 MED ORDER — OLMESARTAN MEDOXOMIL 20 MG PO TABS: 20.0000 mg | ORAL_TABLET | Freq: Every day | ORAL | 3 refills | Status: DC

## 2016-09-21 MED FILL — OLMESARTAN MEDOXOMIL 20 MG: 20 | 90 days supply | Qty: 90 | Fill #0

## 2016-09-21 MED FILL — ROSUVASTATIN CALCIUM 20 MG: 20 | 90 days supply | Qty: 90 | Fill #0

## 2016-09-21 NOTE — Telephone Encounter (Signed)
Order [id number]

## 2016-09-21 NOTE — Progress Notes (Signed)
Pre visit review using our clinic review tool, if applicable. No additional management support is needed unless otherwise documented below in the visit note. 

## 2016-09-21 NOTE — Progress Notes (Signed)
Subjective:  Patient ID: Tim Wilson, male    DOB: Nov 01, 1965  Age: 50 y.o. MRN: [id number]  CC: Hypertension; Hyperlipidemia; Diabetes; and Annual Exam   HPI Tim Wilson presents for a CPX.  His blood sugars are not well-controlled. He saw his endocrinologist about 2 months ago and it was recommended that he start an SGLT2 inhibitor and metformin. He has been taking pioglitazone. His insurance didn't cover it so he didn't get it filled and has therefore not been taking it. He doesn't monitor his blood sugars very frequently,  he denies polyuria, polydipsia, polyphagia.  He ran out of the ARB about a month ago and has therefore not been treating his blood pressure. Fortunately he denies headache, blurred vision, chest pain, shortness of breath, DOE, diaphoresis, or edema.  He has not been taking the medications for his high triglycerides.  Outpatient Medications Prior to Visit  Medication Sig Dispense Refill  . aspirin 325 MG EC tablet Take 325 mg by mouth daily.      . Blood Glucose Monitoring Suppl (ONE TOUCH ULTRA 2) W/DEVICE KIT Use two times a day to test blood sugar     . glucose blood (ONETOUCH VERIO) test strip 1 each by Other route 2 (two) times daily. And lancets 2/day 250.01 100 each 12  . pioglitazone (ACTOS) 45 MG tablet Take 1 tablet (45 mg total) by mouth daily. 30 tablet 11  . fenofibrate (TRICOR) 145 MG tablet Take 1 tablet (145 mg total) by mouth daily. 90 tablet 2  . Icosapent Ethyl (VASCEPA) 0.5 G CAPS Take 2 capsules by mouth 2 (two) times daily. 240 capsule 11  . olmesartan (BENICAR) 20 MG tablet Take 1 tablet (20 mg total) by mouth daily. 90 tablet 3  . Omega-3 Fatty Acids (FISH OIL) 1000 MG CAPS Take 4 capsules by mouth daily.      . rosuvastatin (CRESTOR) 20 MG tablet Take 1 tablet (20 mg total) by mouth daily. 90 tablet 2  . Capsicum, Cayenne, (CAYENNE PEPPER) 450 MG CAPS Take 1 capsule by mouth 3 (three) times a week.      . Linagliptin-Metformin HCl ER  2.02-999 MG TB24 Take 2 tablets by mouth daily. 60 tablet 11   No facility-administered medications prior to visit.     ROS Review of Systems  Constitutional: Negative for activity change, diaphoresis, fatigue and unexpected weight change.  HENT: Negative for trouble swallowing.   Eyes: Negative for visual disturbance.  Respiratory: Positive for apnea. Negative for cough, chest tightness, shortness of breath, wheezing and stridor.        ++ heavy snoring  Cardiovascular: Negative for chest pain, palpitations and leg swelling.  Gastrointestinal: Negative for abdominal pain, blood in stool, constipation, diarrhea, nausea and vomiting.  Endocrine: Negative for cold intolerance, heat intolerance, polydipsia, polyphagia and polyuria.  Genitourinary: Negative for difficulty urinating, discharge, dysuria, hematuria, penile swelling, scrotal swelling, testicular pain and urgency.  Musculoskeletal: Negative.  Negative for arthralgias, back pain, joint swelling and myalgias.  Skin: Negative.   Allergic/Immunologic: Negative.   Neurological: Negative.  Negative for dizziness, weakness and light-headedness.  Hematological: Negative.  Negative for adenopathy. Does not bruise/bleed easily.  Psychiatric/Behavioral: Negative.     Objective:  BP (!) 130/98 (BP Location: Left Arm, Patient Position: Sitting, Cuff Size: Large)   Pulse 73   Temp 98.5 F (36.9 C) (Oral)   Resp 16   Ht '5\' 8"'  (1.727 m)   Wt 215 lb (97.5 kg)   SpO2 98%  BMI 32.69 kg/m   BP Readings from Last 3 Encounters:  09/21/16 (!) 130/98  07/23/16 124/80  04/20/16 (!) 147/91    Wt Readings from Last 3 Encounters:  09/21/16 215 lb (97.5 kg)  07/23/16 213 lb (96.6 kg)  04/20/16 211 lb (95.7 kg)    Physical Exam  Constitutional: He is oriented to person, place, and time. No distress.  HENT:  Mouth/Throat: Oropharynx is clear and moist. No oropharyngeal exudate.  Eyes: Conjunctivae are normal. Right eye exhibits no  discharge. Left eye exhibits no discharge. No scleral icterus.  Neck: Normal range of motion. Neck supple. No JVD present. No tracheal deviation present. No thyromegaly present.  Cardiovascular: Normal rate, regular rhythm, normal heart sounds and intact distal pulses.  Exam reveals no gallop and no friction rub.   No murmur heard. EKG ---  Sinus  Rhythm  -Left axis.   ABNORMAL   Pulmonary/Chest: Effort normal and breath sounds normal. No stridor. No respiratory distress. He has no wheezes. He has no rales. He exhibits no tenderness.  Abdominal: Soft. Bowel sounds are normal. He exhibits no distension and no mass. There is no tenderness. There is no rebound and no guarding. Hernia confirmed negative in the right inguinal area and confirmed negative in the left inguinal area.  Genitourinary: Rectum normal, prostate normal, testes normal and penis normal. Rectal exam shows no external hemorrhoid, no internal hemorrhoid, no fissure, no mass, no tenderness, anal tone normal and guaiac negative stool. Prostate is not enlarged and not tender. Right testis shows no mass, no swelling and no tenderness. Right testis is descended. Left testis shows no mass, no swelling and no tenderness. Left testis is descended. Uncircumcised. No phimosis, paraphimosis, hypospadias, penile erythema or penile tenderness. No discharge found.  Musculoskeletal: Normal range of motion. He exhibits no edema, tenderness or deformity.  Lymphadenopathy:    He has no cervical adenopathy.       Right: No inguinal adenopathy present.       Left: No inguinal adenopathy present.  Neurological: He is oriented to person, place, and time.  Skin: Skin is warm and dry. No rash noted. He is not diaphoretic. No erythema. No pallor.  Psychiatric: He has a normal mood and affect. His behavior is normal. Judgment and thought content normal.  Vitals reviewed.   Lab Results  Component Value Date   WBC 8.2 09/21/2016   HGB 12.3 (L)  09/21/2016   HCT 35.5 (L) 09/21/2016   PLT 857 (H) 09/21/2016   GLUCOSE 192 (H) 09/21/2016   CHOL 460 (H) 09/21/2016   TRIG 4,664 (H) 09/21/2016   HDL 20 (L) 09/21/2016   LDLDIRECT 66.0 09/03/2015   LDLCALC NOT CALC 09/21/2016   ALT 17 09/21/2016   AST 19 09/21/2016   NA 139 09/21/2016   K 4.7 09/21/2016   CL 100 09/21/2016   CREATININE 1.01 09/21/2016   BUN 14 09/21/2016   CO2 20 09/21/2016   TSH 1.10 09/21/2016   PSA 0.86 09/21/2016   HGBA1C 8.6 (H) 09/21/2016   MICROALBUR 19.4 (H) 09/21/2016    No results found.  Assessment & Plan:   Leeon was seen today for hypertension, hyperlipidemia, diabetes and annual exam.  Diagnoses and all orders for this visit:  Routine general medical examination at a health care facility- exam completed, labs reviewed, vaccines reviewed, to screen for colon cancer/polyps at: Was ordered, patient education material was given -     Lipid panel; Future -     Comprehensive metabolic  panel; Future -     CBC with Differential/Platelet; Future -     PSA; Future -     TSH; Future -     Urinalysis, Routine w reflex microscopic; Future  Proteinuria, unspecified type- he has early signs of diabetic nephropathy, vascular restart the ARB and to attempt to get better control of his blood pressure and blood sugars -     Microalbumin / creatinine urine ratio; Future -     olmesartan (BENICAR) 20 MG tablet; Take 1 tablet (20 mg total) by mouth daily.  Type 2 diabetes mellitus with complication, with long-term current use of insulin ([place])- his blood sugars are not well controlled so asked him to start the SGL T2 inhibitor and metformin as directed. I gave him samples of invokamet and will complete the PA form, he is due for his annual eye exam. -     Microalbumin / creatinine urine ratio; Future -     olmesartan (BENICAR) 20 MG tablet; Take 1 tablet (20 mg total) by mouth daily. -     Canagliflozin-Metformin HCl ER (INVOKAMET XR) [phone] MG TB24; Take 1  tablet by mouth daily. -     Ambulatory referral to Ophthalmology -     rosuvastatin (CRESTOR) 20 MG tablet; Take 1 tablet (20 mg total) by mouth daily. -     Hemoglobin A1c; Future  Essential hypertension- his blood pressure is not adequately well controlled, will restart the ARB, his electrolytes and renal function are normal. His EKG is neg for LVH. -     olmesartan (BENICAR) 20 MG tablet; Take 1 tablet (20 mg total) by mouth daily. -     EKG 12-Lead  Essential hypertension, benign  Hyperlipidemia with target LDL less than 100- his LDL could not be checked today due to the severe hypertriglyceridemia, I have asked him to restart a statin for cardiovascular risk reduction. -     rosuvastatin (CRESTOR) 20 MG tablet; Take 1 tablet (20 mg total) by mouth daily.  Snoring -     Ambulatory referral to Sleep Studies  HYPERTRIGLYCERIDEMIA- his triglyceride level is nearly 5000 but he has no signs of pancreatitis. I've asked him to restart Vascepa and TriCor to lower his triglycerides in addition to improving his lifestyle modifications. -     Icosapent Ethyl (VASCEPA) 1 g CAPS; Take 2 capsules by mouth 2 (two) times daily. -     fenofibrate (TRICOR) 145 MG tablet; Take 1 tablet (145 mg total) by mouth daily.   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 I am also having him start on Canagliflozin-Metformin HCl ER and Icosapent Ethyl. Additionally, I am having him maintain his aspirin, ONE TOUCH ULTRA 2, glucose blood, pioglitazone, olmesartan, rosuvastatin, and fenofibrate.  Meds ordered this encounter  Medications  . olmesartan (BENICAR) 20 MG tablet    Sig: Take 1 tablet (20 mg total) by mouth daily.    Dispense:  90 tablet    Refill:  3  . Canagliflozin-Metformin HCl ER (INVOKAMET XR) [phone] MG TB24    Sig: Take 1 tablet by mouth daily.    Dispense:  30 tablet    Refill:  5  . rosuvastatin (CRESTOR) 20 MG tablet     Sig: Take 1 tablet (20 mg total) by mouth daily.    Dispense:  90 tablet    Refill:  2  . Icosapent Ethyl (VASCEPA) 1 g CAPS    Sig: Take 2 capsules  by mouth 2 (two) times daily.    Dispense:  360 capsule    Refill:  3    The patient must receive Vascepa - must be on EPA since DHA products raise the LDL-cholesterol  . fenofibrate (TRICOR) 145 MG tablet    Sig: Take 1 tablet (145 mg total) by mouth daily.    Dispense:  90 tablet    Refill:  2     Follow-up: Return in about 4 months (around 01/20/2017).  Scarlette Calico, MD

## 2016-09-21 NOTE — Patient Instructions (Signed)

## 2016-09-22 MED ORDER — FENOFIBRATE 145 MG PO TABS: 145.0000 mg | ORAL_TABLET | Freq: Every day | ORAL | 2 refills | Status: DC

## 2016-09-22 MED ORDER — ICOSAPENT ETHYL 1 G PO CAPS: 2.0000 | ORAL_CAPSULE | Freq: Two times a day (BID) | ORAL | 3 refills | Status: DC

## 2016-09-22 MED FILL — PIOGLITAZONE HCL 45 MG TAB: 45 | 30 days supply | Qty: 30 | Fill #1

## 2016-09-22 MED FILL — FENOFIBRATE 145 MG TABLET: 145 | 90 days supply | Qty: 90 | Fill #0

## 2016-10-01 ENCOUNTER — Telehealth: Payer: Self-pay | Admitting: Internal Medicine

## 2016-10-01 NOTE — Telephone Encounter (Signed)
[facility] pharmacy called to check up on the PA for INVOKAMET XR. Please check, she said they requested it since 09/21/16.

## 2016-10-12 ENCOUNTER — Telehealth: Payer: Self-pay

## 2016-10-12 ENCOUNTER — Other Ambulatory Visit: Payer: Self-pay | Admitting: Internal Medicine

## 2016-10-12 NOTE — Telephone Encounter (Signed)
[facility] sent a fax stating "We got a script for Invokamet XR for pt. Rq to dispense 30 but the bottle comes in #60 and states it can't be taken out of riginal container please advise"  Okay to dispense #60?

## 2016-10-12 NOTE — Telephone Encounter (Signed)
Contacted pharmacy and informed it was okay to dispense the #60.

## 2016-10-12 NOTE — Telephone Encounter (Signed)
yes

## 2016-10-13 NOTE — Telephone Encounter (Signed)
PA approved.

## 2016-10-14 NOTE — Telephone Encounter (Signed)
LVM for pt to call back as soon as possible.   RE: PA was approved.

## 2016-10-22 ENCOUNTER — Encounter: Payer: Self-pay | Admitting: Endocrinology

## 2016-10-22 ENCOUNTER — Ambulatory Visit (INDEPENDENT_AMBULATORY_CARE_PROVIDER_SITE_OTHER): Payer: Federal, State, Local not specified - PPO | Admitting: Endocrinology

## 2016-10-22 VITALS — BP 118/80 | HR 68 | Wt 218.0 lb

## 2016-10-22 DIAGNOSIS — E118 Type 2 diabetes mellitus with unspecified complications: Secondary | ICD-10-CM | POA: Diagnosis not present

## 2016-10-22 MED ORDER — SITAGLIPTIN PHOSPHATE 100 MG PO TABS
100.0000 mg | ORAL_TABLET | Freq: Every day | ORAL | 11 refills | Status: DC
Start: 2016-10-22 — End: 2018-01-30

## 2016-10-22 MED ORDER — CANAGLIFLOZIN 300 MG PO TABS: 300.0000 mg | ORAL_TABLET | Freq: Every day | ORAL | 11 refills | Status: DC

## 2016-10-22 NOTE — Progress Notes (Signed)
Subjective:    Patient ID: Tim Wilson, male    DOB: Nov 16, 1965, 51 y.o.   MRN: [id number]  HPI Pt returns for f/u of diabetes mellitus: DM type: 2 Dx'ed: 4481 Complications: none Therapy: 3 oral meds DKA: never Severe hypoglycemia: never.  Pancreatitis: never.   Other: he took insulin in 2012, and 2014-2015;  Interval history: He stopped invokamet, due to diarrhea.  pt states he feels well in general.  Dinner is by far his biggest meal.   Past Medical History:  Diagnosis Date  . Diabetes mellitus   . Hyperlipidemia   . Hypertension     No past surgical history on file.  Social History   Social History  . Marital status: Married    Spouse name: N/A  . Number of children: N/A  . Years of education: N/A   Occupational History  . 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 Smoking status: Never Smoker  . Smokeless tobacco: Never Used  . Alcohol use 1.2 oz/week    2 Cans of beer per week     Comment: on occasion, not in excess  . Drug use: No  . Sexual activity: Yes   Other Topics Concern  . Not on file   Social History Narrative   Regular exercise-yes    Current Outpatient Prescriptions on File Prior to Visit  Medication Sig Dispense Refill  . aspirin 325 MG EC tablet Take 325 mg by mouth daily.      . Blood Glucose Monitoring Suppl (ONE TOUCH ULTRA 2) W/DEVICE KIT Use two times a day to test blood sugar     . fenofibrate (TRICOR) 145 MG tablet Take 1 tablet (145 mg total) by mouth daily. 90 tablet 2  . glucose blood (ONETOUCH VERIO) test strip 1 each by Other route 2 (two) times daily. And lancets 2/day 250.01 100 each 12  . Icosapent Ethyl (VASCEPA) 1 g CAPS Take 2 capsules by mouth 2 (two) times daily. 360 capsule 3  . olmesartan (BENICAR) 20 MG tablet Take 1 tablet (20 mg total) by mouth daily. 90 tablet 3  . pioglitazone (ACTOS) 45 MG tablet Take 1 tablet (45 mg total) by  mouth daily. 30 tablet 11  . rosuvastatin (CRESTOR) 20 MG tablet Take 1 tablet (20 mg total) by mouth daily. 90 tablet 2  . [DISCONTINUED] colchicine 0.6 MG tablet Take 1 tablet (0.6 mg total) by mouth 2 (two) times daily. 60 tablet 2   No current facility-administered medications on file prior to visit.     Allergies  Allergen Reactions  . Ramipril     cough    Family History  Problem Relation Age of Onset  . Diabetes Mother   . Heart disease Father   . Heart disease Brother     BP 118/80 (BP Location: Left Arm, Patient Position: Sitting)   Pulse 68   Wt 218 lb (98.9 kg)   SpO2 97%   BMI 33.15 kg/m    Review of Systems He denies hypoglycemia.      Objective:   Physical Exam VITAL SIGNS:  See vs page GENERAL: no distress Pulses: dorsalis pedis intact bilat.   MSK: no deformity of the feet CV: no leg edema.  Skin:  no ulcer on the feet.  normal color and temp on the feet.  Neuro: sensation is intact to touch on the feet.     A1c=8.7%  Assessment & Plan:  Diarrhea: this is precluding metformin rx Hypertriglyceridemia.  In this setting, he should continue pioglitizone. Type 2 DM: worse.  He may need the addition of repaglinide with supper.    Patient is advised the following: Patient Instructions  check your blood sugar twice a day.  vary the time of day when you check, between before the 3 meals, and at bedtime.  also check if you have symptoms of your blood sugar being too high or too low.  please keep a record of the readings and bring it to your next appointment here.  You can write it on any piece of paper.  please call us sooner if your blood sugar goes below 70, or if you have a lot of readings over 200.   I have sent 2 prescriptions to your pharmacy: to change invokamet to invokana, and to start [place].   Please continue the same pioglitizone.   Please come back for a follow-up appointment in 2 months.

## 2016-10-22 NOTE — Patient Instructions (Addendum)
check your blood sugar twice a day.  vary the time of day when you check, between before the 3 meals, and at bedtime.  also check if you have symptoms of your blood sugar being too high or too low.  please keep a record of the readings and bring it to your next appointment here.  You can write it on any piece of paper.  please call us sooner if your blood sugar goes below 70, or if you have a lot of readings over 200.   I have sent 2 prescriptions to your pharmacy: to change invokamet to invokana, and to start [place].   Please continue the same pioglitizone.   Please come back for a follow-up appointment in 2 months.

## 2016-11-03 ENCOUNTER — Other Ambulatory Visit: Payer: Self-pay | Admitting: Endocrinology

## 2016-11-11 NOTE — Telephone Encounter (Signed)
09/22/2016: Kit Delivered to Patient

## 2016-12-08 MED FILL — PIOGLITAZONE HCL 45 MG TAB: 45 | 30 days supply | Qty: 30 | Fill #2

## 2016-12-20 ENCOUNTER — Ambulatory Visit (INDEPENDENT_AMBULATORY_CARE_PROVIDER_SITE_OTHER): Payer: Federal, State, Local not specified - PPO | Admitting: Endocrinology

## 2016-12-20 ENCOUNTER — Other Ambulatory Visit: Payer: Self-pay

## 2016-12-20 ENCOUNTER — Encounter: Payer: Self-pay | Admitting: Endocrinology

## 2016-12-20 VITALS — BP 122/84 | HR 80 | Ht 68.0 in | Wt 215.0 lb

## 2016-12-20 DIAGNOSIS — E118 Type 2 diabetes mellitus with unspecified complications: Secondary | ICD-10-CM

## 2016-12-20 LAB — POCT GLYCOSYLATED HEMOGLOBIN (HGB A1C): HEMOGLOBIN A1C: 9.6

## 2016-12-20 MED ORDER — GLIMEPIRIDE 4 MG PO TABS: 4.0000 mg | ORAL_TABLET | Freq: Every day | ORAL | 11 refills | Status: DC

## 2016-12-20 MED FILL — GLIMEPIRIDE 4 MG TABLET: 4 | 30 days supply | Qty: 30 | Fill #0

## 2016-12-20 NOTE — Patient Instructions (Addendum)
check your blood sugar twice a day.  vary the time of day when you check, between before the 3 meals, and at bedtime.  also check if you have symptoms of your blood sugar being too high or too low.  please keep a record of the readings and bring it to your next appointment here.  You can write it on any piece of paper.  please call us sooner if your blood sugar goes below 70, or if you have a lot of readings over 200.   I have sent a prescription to your pharmacy, to ad glimepiride.    Please come back for a follow-up appointment in 2 months.

## 2016-12-20 NOTE — Progress Notes (Signed)
Subjective:    Patient ID: Tim Wilson, male    DOB: 03/12/1966, 51 y.o.   MRN: [id number]  HPI Pt returns for f/u of diabetes mellitus: DM type: 2 Dx'ed: 9574 Complications: none Therapy: 3 oral meds DKA: never Severe hypoglycemia: never.  Pancreatitis: never.   Other: he took insulin in 2012, and 2014-2015; he did not tolerate metformin-XR or invokana (diarrhea with both).  Interval hx: pt states he feels well in general.  He says cbg's are in the 100's fasting.  He does not check at other times of day. Past Medical History:  Diagnosis Date  . Diabetes mellitus   . Hyperlipidemia   . Hypertension     No past surgical history on file.  Social History   Social History  . Marital status: Married    Spouse name: N/A  . Number of children: N/A  . Years of education: N/A   Occupational History  . 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 Smoking status: Never Smoker  . Smokeless tobacco: Never Used  . Alcohol use 1.2 oz/week    2 Cans of beer per week     Comment: on occasion, not in excess  . Drug use: No  . Sexual activity: Yes   Other Topics Concern  . Not on file   Social History Narrative   Regular exercise-yes    Current Outpatient Prescriptions on File Prior to Visit  Medication Sig Dispense Refill  . aspirin 325 MG EC tablet Take 325 mg by mouth daily.      . Blood Glucose Monitoring Suppl (ONE TOUCH ULTRA 2) W/DEVICE KIT Use two times a day to test blood sugar     . fenofibrate (TRICOR) 145 MG tablet Take 1 tablet (145 mg total) by mouth daily. 90 tablet 2  . glucose blood (ONETOUCH VERIO) test strip 1 each by Other route 2 (two) times daily. And lancets 2/day 250.01 100 each 12  . Icosapent Ethyl (VASCEPA) 1 g CAPS Take 2 capsules by mouth 2 (two) times daily. 360 capsule 3  . olmesartan (BENICAR) 20 MG tablet Take 1 tablet (20 mg total) by mouth daily. 90 tablet 3  .  pioglitazone (ACTOS) 45 MG tablet Take 1 tablet (45 mg total) by mouth daily. 30 tablet 11  . rosuvastatin (CRESTOR) 20 MG tablet Take 1 tablet (20 mg total) by mouth daily. 90 tablet 2  . sitaGLIPtin (JANUVIA) 100 MG tablet Take 1 tablet (100 mg total) by mouth daily. 30 tablet 11  . [DISCONTINUED] colchicine 0.6 MG tablet Take 1 tablet (0.6 mg total) by mouth 2 (two) times daily. 60 tablet 2   No current facility-administered medications on file prior to visit.     Allergies  Allergen Reactions  . Ramipril     cough    Family History  Problem Relation Age of Onset  . Diabetes Mother   . Heart disease Father   . Heart disease Brother     BP 122/84   Pulse 80   Ht '5\' 8"'  (1.727 m)   Wt 215 lb (97.5 kg)   SpO2 96%   BMI 32.69 kg/m    Review of Systems He denies hypoglycemia.      Objective:   Physical Exam VITAL SIGNS:  See vs page GENERAL: no distress Pulses: dorsalis pedis intact bilat.   MSK: no deformity of the feet CV: no leg edema  Skin:  no ulcer on the feet.  normal color and temp on the feet. Neuro: sensation is intact to touch on the feet.  Lab Results  Component Value Date   HGBA1C 9.6 12/20/2016      Assessment & Plan:  Type 2 DM: worse Diarrhea: this limits oral rx options.  Patient is advised the following: Patient Instructions  check your blood sugar twice a day.  vary the time of day when you check, between before the 3 meals, and at bedtime.  also check if you have symptoms of your blood sugar being too high or too low.  please keep a record of the readings and bring it to your next appointment here.  You can write it on any piece of paper.  please call us sooner if your blood sugar goes below 70, or if you have a lot of readings over 200.   I have sent a prescription to your pharmacy, to ad glimepiride.    Please come back for a follow-up appointment in 2 months.

## 2016-12-21 MED FILL — JANUVIA 100 MG TABLET: 100 | 30 days supply | Qty: 30 | Fill #0

## 2017-01-18 MED FILL — ROSUVASTATIN CALCIUM 20 MG: 20 | 90 days supply | Qty: 90 | Fill #1

## 2017-01-18 MED FILL — PIOGLITAZONE HCL 45 MG TAB: 45 | 30 days supply | Qty: 30 | Fill #3

## 2017-01-20 ENCOUNTER — Other Ambulatory Visit (INDEPENDENT_AMBULATORY_CARE_PROVIDER_SITE_OTHER): Payer: Federal, State, Local not specified - PPO

## 2017-01-20 ENCOUNTER — Encounter: Payer: Self-pay | Admitting: Internal Medicine

## 2017-01-20 ENCOUNTER — Ambulatory Visit (INDEPENDENT_AMBULATORY_CARE_PROVIDER_SITE_OTHER): Payer: Federal, State, Local not specified - PPO | Admitting: Internal Medicine

## 2017-01-20 VITALS — BP 138/88 | HR 70 | Temp 98.0°F | Resp 16 | Ht 68.0 in | Wt 218.0 lb

## 2017-01-20 DIAGNOSIS — Z1211 Encounter for screening for malignant neoplasm of colon: Secondary | ICD-10-CM | POA: Insufficient documentation

## 2017-01-20 DIAGNOSIS — E781 Pure hyperglyceridemia: Secondary | ICD-10-CM

## 2017-01-20 DIAGNOSIS — E118 Type 2 diabetes mellitus with unspecified complications: Secondary | ICD-10-CM | POA: Diagnosis not present

## 2017-01-20 DIAGNOSIS — R0609 Other forms of dyspnea: Secondary | ICD-10-CM

## 2017-01-20 DIAGNOSIS — E785 Hyperlipidemia, unspecified: Secondary | ICD-10-CM | POA: Diagnosis not present

## 2017-01-20 DIAGNOSIS — D518 Other vitamin B12 deficiency anemias: Secondary | ICD-10-CM | POA: Diagnosis not present

## 2017-01-20 DIAGNOSIS — D539 Nutritional anemia, unspecified: Secondary | ICD-10-CM

## 2017-01-20 DIAGNOSIS — D508 Other iron deficiency anemias: Secondary | ICD-10-CM | POA: Diagnosis not present

## 2017-01-20 DIAGNOSIS — I10 Essential (primary) hypertension: Secondary | ICD-10-CM

## 2017-01-20 DIAGNOSIS — R0600 Dyspnea, unspecified: Secondary | ICD-10-CM

## 2017-01-20 LAB — CBC WITH DIFFERENTIAL/PLATELET
Basophils Absolute: 0.2 10*3/uL — ABNORMAL HIGH (ref 0.0–0.1)
Basophils Relative: 2.1 % (ref 0.0–3.0)
EOS PCT: 1.8 % (ref 0.0–5.0)
Eosinophils Absolute: 0.2 10*3/uL (ref 0.0–0.7)
HEMATOCRIT: 39.8 % (ref 39.0–52.0)
Hemoglobin: 12.9 g/dL — ABNORMAL LOW (ref 13.0–17.0)
LYMPHS ABS: 3.6 10*3/uL (ref 0.7–4.0)
Lymphocytes Relative: 42 % (ref 12.0–46.0)
MCHC: 32.5 g/dL (ref 30.0–36.0)
MCV: 82.6 fl (ref 78.0–100.0)
MONOS PCT: 7.1 % (ref 3.0–12.0)
Monocytes Absolute: 0.6 10*3/uL (ref 0.1–1.0)
NEUTROS ABS: 4 10*3/uL (ref 1.4–7.7)
NEUTROS PCT: 47 % (ref 43.0–77.0)
PLATELETS: 328 10*3/uL (ref 150.0–400.0)
RBC: 4.82 Mil/uL (ref 4.22–5.81)
RDW: 13.5 % (ref 11.5–15.5)
WBC: 8.5 10*3/uL (ref 4.0–10.5)

## 2017-01-20 LAB — LIPID PANEL
CHOLESTEROL: 240 mg/dL — AB (ref 0–200)
HDL: 37.6 mg/dL — AB (ref >39.00)
NonHDL: 202.47
Total CHOL/HDL Ratio: 6
Triglycerides: 243 mg/dL — ABNORMAL HIGH (ref 0.0–149.0)
VLDL: 48.6 mg/dL — ABNORMAL HIGH (ref 0.0–40.0)

## 2017-01-20 LAB — BASIC METABOLIC PANEL
BUN: 16 mg/dL (ref 6–23)
CALCIUM: 9.8 mg/dL (ref 8.4–10.5)
CO2: 28 meq/L (ref 19–32)
CREATININE: 1.13 mg/dL (ref 0.40–1.50)
Chloride: 105 mEq/L (ref 96–112)
GFR: 87.96 mL/min (ref >60.00)
Glucose, Bld: 123 mg/dL — ABNORMAL HIGH (ref 70–99)
Potassium: 4.2 mEq/L (ref 3.5–5.1)
Sodium: 139 mEq/L (ref 135–145)

## 2017-01-20 LAB — RETICULOCYTES
ABS RETIC: 61750 {cells}/uL (ref 25000–90000)
RBC.: 4.75 MIL/uL (ref 4.20–5.80)
Retic Ct Pct: 1.3 %

## 2017-01-20 LAB — IBC PANEL
IRON: 41 ug/dL — AB (ref 42–165)
Saturation Ratios: 9 % — ABNORMAL LOW (ref 20.0–50.0)
Transferrin: 324 mg/dL (ref 212.0–360.0)

## 2017-01-20 LAB — FERRITIN: FERRITIN: 517.3 ng/mL — AB (ref 22.0–322.0)

## 2017-01-20 LAB — LDL CHOLESTEROL, DIRECT: Direct LDL: 143 mg/dL

## 2017-01-20 LAB — VITAMIN B12: Vitamin B-12: 195 pg/mL — ABNORMAL LOW (ref 211–911)

## 2017-01-20 LAB — FOLATE: Folate: 13.9 ng/mL (ref >5.9)

## 2017-01-20 MED ORDER — FERIVA 21/7 75-1 MG PO TABS: 1.0000 | ORAL_TABLET | Freq: Every day | ORAL | 11 refills | Status: DC

## 2017-01-20 NOTE — Patient Instructions (Signed)
Anemia, Nonspecific Anemia is a condition in which the concentration of red blood cells or hemoglobin in the blood is below normal. Hemoglobin is a substance in red blood cells that carries oxygen to the tissues of the body. Anemia results in not enough oxygen reaching these tissues. What are the causes? Common causes of anemia include:  Excessive bleeding. Bleeding may be internal or external. This includes excessive bleeding from periods (in women) or from the intestine.  Poor nutrition.  Chronic kidney, thyroid, and liver disease.  Bone marrow disorders that decrease red blood cell production.  Cancer and treatments for cancer.  HIV, AIDS, and their treatments.  Spleen problems that increase red blood cell destruction.  Blood disorders.  Excess destruction of red blood cells due to infection, medicines, and autoimmune disorders. What are the signs or symptoms?  Minor weakness.  Dizziness.  Headache.  Palpitations.  Shortness of breath, especially with exercise.  Paleness.  Cold sensitivity.  Indigestion.  Nausea.  Difficulty sleeping.  Difficulty concentrating. Symptoms may occur suddenly or they may develop slowly. How is this diagnosed? Additional blood tests are often needed. These help your health care provider determine the best treatment. Your health care provider will check your stool for blood and look for other causes of blood loss. How is this treated? Treatment varies depending on the cause of the anemia. Treatment can include:  Supplements of iron, vitamin B12, or folic acid.  Hormone medicines.  A blood transfusion. This may be needed if blood loss is severe.  Hospitalization. This may be needed if there is significant continual blood loss.  Dietary changes.  Spleen removal. Follow these instructions at home: Keep all follow-up appointments. It often takes many weeks to correct anemia, and having your health care provider check on your  condition and your response to treatment is very important. Get help right away if:  You develop extreme weakness, shortness of breath, or chest pain.  You become dizzy or have trouble concentrating.  You develop heavy vaginal bleeding.  You develop a rash.  You have bloody or black, tarry stools.  You faint.  You vomit up blood.  You vomit repeatedly.  You have abdominal pain.  You have a fever or persistent symptoms for more than 2-3 days.  You have a fever and your symptoms suddenly get worse.  You are dehydrated. This information is not intended to replace advice given to you by your health care provider. Make sure you discuss any questions you have with your health care provider. Document Released: 10/28/2004 Document Revised: 03/03/2016 Document Reviewed: 03/16/2013 Elsevier Interactive Patient Education  2017 Elsevier Inc.  

## 2017-01-20 NOTE — Progress Notes (Signed)
Pre visit review using our clinic review tool, if applicable. No additional management support is needed unless otherwise documented below in the visit note. 

## 2017-01-20 NOTE — Progress Notes (Signed)
Subjective:  Patient ID: Tim Wilson, male    DOB: [birth year]  Age: 51 y.o. MRN: [id number]  CC: Anemia; Hyperlipidemia; Diabetes; and Hypertension   HPI Tim Wilson presents for f/up - Over the last few weeks he has had a few episodes of DOE and fatigue. He denies chest pain, diaphoresis, palpitations, edema, or near-syncope. He is aware that he is anemic but is not aware of any sources of blood loss. He denies paresthesias. He saw endocrinology recently and unfortunately his A1c was up to 9.6% and he was asked to start taking Actos. He was concerned that Actos may have caused his symptoms of fatigue and DOE so he stopped taking it. He wishes to f/up with endocrinology about his elevated blood sugars.  Outpatient Medications Prior to Visit  Medication Sig Dispense Refill  . aspirin 325 MG EC tablet Take 325 mg by mouth daily.      . Blood Glucose Monitoring Suppl (ONE TOUCH ULTRA 2) W/DEVICE KIT Use two times a day to test blood sugar     . fenofibrate (TRICOR) 145 MG tablet Take 1 tablet (145 mg total) by mouth daily. 90 tablet 2  . glimepiride (AMARYL) 4 MG tablet Take 1 tablet (4 mg total) by mouth daily before breakfast. 30 tablet 11  . glucose blood (ONETOUCH VERIO) test strip 1 each by Other route 2 (two) times daily. And lancets 2/day 250.01 100 each 12  . Icosapent Ethyl (VASCEPA) 1 g CAPS Take 2 capsules by mouth 2 (two) times daily. 360 capsule 3  . olmesartan (BENICAR) 20 MG tablet Take 1 tablet (20 mg total) by mouth daily. 90 tablet 3  . rosuvastatin (CRESTOR) 20 MG tablet Take 1 tablet (20 mg total) by mouth daily. 90 tablet 2  . sitaGLIPtin (JANUVIA) 100 MG tablet Take 1 tablet (100 mg total) by mouth daily. 30 tablet 11  . pioglitazone (ACTOS) 45 MG tablet Take 1 tablet (45 mg total) by mouth daily. 30 tablet 11   No facility-administered medications prior to visit.     ROS Review of Systems  Constitutional: Positive for fatigue. Negative for appetite change,  chills, diaphoresis and unexpected weight change.  HENT: Negative.  Negative for trouble swallowing.   Eyes: Negative for visual disturbance.  Respiratory: Positive for shortness of breath (DOE). Negative for cough, chest tightness and wheezing.   Cardiovascular: Negative for chest pain, palpitations and leg swelling.  Gastrointestinal: Negative for abdominal pain, constipation, diarrhea, nausea and vomiting.  Endocrine: Negative.  Negative for polydipsia, polyphagia and polyuria.  Genitourinary: Negative.  Negative for decreased urine volume, difficulty urinating and urgency.  Musculoskeletal: Negative.  Negative for back pain, myalgias and neck pain.  Skin: Negative for color change and rash.  Neurological: Negative.  Negative for weakness and numbness.  Hematological: Negative.  Negative for adenopathy. Does not bruise/bleed easily.  Psychiatric/Behavioral: Negative.     Objective:  BP 138/88 (BP Location: Left Arm, Patient Position: Sitting, Cuff Size: Large)   Pulse 70   Temp 98 F (36.7 C) (Oral)   Resp 16   Ht _0  (1.727 m)   Wt 218 lb (98.9 kg)   SpO2 98%   BMI 33.15 kg/m   BP Readings from Last 3 Encounters:  01/20/17 138/88  12/20/16 122/84  10/22/16 118/80    Wt Readings from Last 3 Encounters:  01/20/17 218 lb (98.9 kg)  12/20/16 215 lb (97.5 kg)  10/22/16 218 lb (98.9 kg)    Physical Exam  Constitutional: He is oriented to person, place, and time. No distress.  HENT:  Mouth/Throat: Oropharynx is clear and moist. No oropharyngeal exudate.  Eyes: Conjunctivae are normal. Right eye exhibits no discharge. Left eye exhibits no discharge. No scleral icterus.  Neck: Normal range of motion. Neck supple. No JVD present. No tracheal deviation present. No thyromegaly present.  Cardiovascular: Normal rate, regular rhythm, normal heart sounds and intact distal pulses.  Exam reveals no gallop and no friction rub.   No murmur heard. Pulses:      Carotid pulses are 1+  on the right side, and 1+ on the left side.      Radial pulses are 1+ on the right side, and 1+ on the left side.       Femoral pulses are 1+ on the right side, and 1+ on the left side.      Popliteal pulses are 1+ on the right side, and 1+ on the left side.       Dorsalis pedis pulses are 1+ on the right side, and 1+ on the left side.       Posterior tibial pulses are 1+ on the right side, and 1+ on the left side.  Sinus  Rhythm  WITHIN NORMAL LIMITS- normal EKG  No change from prior EKG  Pulmonary/Chest: Effort normal and breath sounds normal. No stridor. No respiratory distress. He has no wheezes. He has no rales. He exhibits no tenderness.  Abdominal: Soft. Bowel sounds are normal. He exhibits no distension and no mass. There is no tenderness. There is no rebound and no guarding.  Musculoskeletal: Normal range of motion. He exhibits no edema, tenderness or deformity.  Lymphadenopathy:    He has no cervical adenopathy.  Neurological: He is oriented to person, place, and time.  Skin: Skin is warm and dry. No rash noted. He is not diaphoretic. No erythema. No pallor.  Vitals reviewed.   Lab Results  Component Value Date   WBC 8.5 01/20/2017   HGB 12.9 (L) 01/20/2017   HCT 39.8 01/20/2017   PLT 328.0 01/20/2017   GLUCOSE 123 (H) 01/20/2017   CHOL 240 (H) 01/20/2017   TRIG 243.0 (H) 01/20/2017   HDL 37.60 (L) 01/20/2017   LDLDIRECT 143.0 01/20/2017   LDLCALC NOT CALC 09/21/2016   ALT 17 09/21/2016   AST 19 09/21/2016   NA 139 01/20/2017   K 4.2 01/20/2017   CL 105 01/20/2017   CREATININE 1.13 01/20/2017   BUN 16 01/20/2017   CO2 28 01/20/2017   TSH 1.10 09/21/2016   PSA 0.86 09/21/2016   HGBA1C 9.6 12/20/2016   MICROALBUR 19.4 (H) 09/21/2016    No results found.  Assessment & Plan:   Phi was seen today for anemia, hyperlipidemia, diabetes and hypertension.  Diagnoses and all orders for this visit:  HYPERTRIGLYCERIDEMIA- marked improvement noted, will continue the  current medications and encouraged him to improve his lifestyle modifications -     Lipid panel; Future  Hyperlipidemia with target LDL less than 100- he has not achieved his LDL goal, Avastin be more compliant with the statin therapy -     Lipid panel; Future  Deficiency anemia- he has mild iron deficiency anemia so I have asked him to see GI to consider endoscopy to screen for GI sources of blood loss, he also has mild B12 deficiency so will start monthly B12 injections. -     CBC with Differential/Platelet; Future -     Vitamin B12; Future -  IBC panel; Future -     Folate; Future -     Ferritin; Future -     Reticulocytes; Future -     Ambulatory referral to Gastroenterology  Essential hypertension- his blood pressure is adequately well-controlled, electrolytes and renal function are normal. -     Basic metabolic panel; Future  Screen for colon cancer -     Ambulatory referral to Gastroenterology  Type 2 diabetes mellitus with complication, without long-term current use of insulin ([place])- his blood sugars are not adequately well controlled and he has stopped Actos, he agrees to follow-up with endocrinology regarding his elevated blood sugars. -     Ambulatory referral to Ophthalmology  DOE (dyspnea on exertion)- his EKG is negative for LVH or ischemia, I don't think he needs a workup for ischemia at this time since he doesn't experience chest pain, I am concerned that the anemia may be part of this so will treat his anemia aggressively and will see if the symptoms resolve now that he has stopped taking Actos. -     EKG 12-Lead  Other vitamin B12 deficiency anemia  Other iron deficiency anemia -     FeAsp-B12-FA-C-DSS-SuccAc-Zn (FERIVA 21/7) 75-1 MG TABS; Take 1 tablet by mouth daily.   I have discontinued Mr. Amend's pioglitazone. I am also having him start on [place] 21/7. Additionally, I am having him maintain his aspirin, ONE TOUCH ULTRA 2, glucose blood, olmesartan,  rosuvastatin, Icosapent Ethyl, fenofibrate, sitaGLIPtin, and glimepiride.  Meds ordered this encounter  Medications  . FeAsp-B12-FA-C-DSS-SuccAc-Zn (FERIVA 21/7) 75-1 MG TABS    Sig: Take 1 tablet by mouth daily.    Dispense:  28 tablet    Refill:  11     Follow-up: Return in about 3 months (around 04/21/2017).  Scarlette Calico, MD

## 2017-01-21 ENCOUNTER — Telehealth: Payer: Self-pay | Admitting: Internal Medicine

## 2017-01-21 ENCOUNTER — Ambulatory Visit (INDEPENDENT_AMBULATORY_CARE_PROVIDER_SITE_OTHER): Payer: Federal, State, Local not specified - PPO | Admitting: Emergency Medicine

## 2017-01-21 DIAGNOSIS — E538 Deficiency of other specified B group vitamins: Secondary | ICD-10-CM

## 2017-01-21 MED ORDER — CYANOCOBALAMIN 1000 MCG/ML IJ SOLN
1000.0000 ug | Freq: Once | INTRAMUSCULAR | Status: AC
  Administered 2017-01-21: 1000 ug via INTRAMUSCULAR

## 2017-01-21 NOTE — Telephone Encounter (Signed)
Pt came in today for a B12 injection. Please advise if patient is supposed to come back for another injection or if this was a once time injection.

## 2017-01-21 NOTE — Telephone Encounter (Signed)
Pt called back. I gave him MD response from labs. He made an appointment for a B12 injection today (01/21/17) at 2:15pm.

## 2017-01-23 NOTE — Telephone Encounter (Signed)
I recommend that he get a B12 injection monthly

## 2017-01-24 NOTE — Telephone Encounter (Signed)
Called pt no answer LMOM w/MD response../lmb 

## 2017-01-26 MED FILL — FENOFIBRATE 145 MG TAB: 145 | 90 days supply | Qty: 90 | Fill #1

## 2017-01-26 MED FILL — OLMESARTAN MEDOXOMIL 20 MG: 20 | 90 days supply | Qty: 90 | Fill #1

## 2017-01-26 MED FILL — GLIMEPIRIDE 4 MG TABLET: 4 | 30 days supply | Qty: 30 | Fill #1

## 2017-01-26 MED FILL — JANUVIA 100 MG TABLET: 100 | 30 days supply | Qty: 30 | Fill #1

## 2017-01-28 ENCOUNTER — Encounter: Payer: Self-pay | Admitting: Gastroenterology

## 2017-02-02 ENCOUNTER — Telehealth: Payer: Self-pay | Admitting: Gastroenterology

## 2017-02-02 ENCOUNTER — Other Ambulatory Visit: Payer: Self-pay

## 2017-02-02 ENCOUNTER — Encounter: Payer: Self-pay | Admitting: Gastroenterology

## 2017-02-02 ENCOUNTER — Ambulatory Visit (INDEPENDENT_AMBULATORY_CARE_PROVIDER_SITE_OTHER): Payer: Federal, State, Local not specified - PPO | Admitting: Gastroenterology

## 2017-02-02 ENCOUNTER — Encounter (INDEPENDENT_AMBULATORY_CARE_PROVIDER_SITE_OTHER): Payer: Self-pay

## 2017-02-02 VITALS — BP 118/80 | HR 76 | Ht 68.0 in | Wt 219.0 lb

## 2017-02-02 DIAGNOSIS — E538 Deficiency of other specified B group vitamins: Secondary | ICD-10-CM | POA: Diagnosis not present

## 2017-02-02 DIAGNOSIS — Z1211 Encounter for screening for malignant neoplasm of colon: Secondary | ICD-10-CM | POA: Diagnosis not present

## 2017-02-02 MED ORDER — NA SULFATE-K SULFATE-MG SULF 17.5-3.13-1.6 GM/177ML PO SOLN: 1.0000 | Freq: Once | ORAL | 0 refills | Status: DC

## 2017-02-02 MED ORDER — NA SULFATE-K SULFATE-MG SULF 17.5-3.13-1.6 GM/177ML PO SOLN: 1.0000 | Freq: Once | ORAL | 0 refills | Status: AC

## 2017-02-02 NOTE — Telephone Encounter (Signed)
Suprep was resent as requested by the patient.

## 2017-02-02 NOTE — Progress Notes (Signed)
[facility] Consult Note:  History: Tim Wilson 02/02/2017  Referring physician: Scarlette Calico, MD  Reason for consult/chief complaint: Colon Cancer Screening (Pt states not having any GI symptoms)   Subjective  HPI:  This man was referred by primary care for concerns of iron deficiency anemia. He was recently found to have an iron level of 41 with a saturation of 9%, ferritin over 500. His B12 level was also mildly low at 195. He has no digestive symptoms. Specifically, he denies heartburn, nausea, vomiting, dysphagia, altered bowel habits, or passage of black or bloody stool. His appetite is good and his weight stable. He denies a family history of esophageal, gastric or colon or rectal cancer. He has never been screened for colon cancer but is aware that he should do so. He started B12 shots and tablets. He eats a wide and varied diet including meat and fish.  ROS:  Review of Systems  Constitutional: Negative for appetite change and unexpected weight change.  HENT: Negative for mouth sores and voice change.   Eyes: Negative for pain and redness.  Respiratory: Negative for cough and shortness of breath.   Cardiovascular: Negative for chest pain and palpitations.  Genitourinary: Negative for dysuria and hematuria.  Musculoskeletal: Negative for arthralgias and myalgias.  Skin: Negative for pallor and rash.  Neurological: Negative for weakness and headaches.  Hematological: Negative for adenopathy.     Past Medical History: Past Medical History:  Diagnosis Date  . Diabetes mellitus   . Hyperlipidemia   . Hypertension     His diabetes has been under suboptimal control with hemoglobin A1c of 9.6 last month. He is under care of endocrinology Past Surgical History: History reviewed. No pertinent surgical history. No surgery  Family History: Family History  Problem Relation Age of Onset  . Heart disease Father   . Heart disease Brother   .  Diabetes Mother   . Breast cancer Cousin   . Colon cancer Neg Hx   . Stomach cancer Neg Hx   . Esophageal cancer Neg Hx     Social History: Social History   Social History  . Marital status: Married    Spouse name: N/A  . Number of children: N/A  . Years of education: N/A   Occupational History  . 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 Smoking status: Never Smoker  . Smokeless tobacco: Never Used  . Alcohol use 1.2 oz/week    2 Cans of beer per week     Comment: on occasion, not in excess  . Drug use: No  . Sexual activity: Yes   Other Topics Concern  . None   Social History Narrative   Regular exercise-yes   He owns a local taxi service  Allergies: Allergies  Allergen Reactions  . Ramipril     cough    Outpatient Meds: Current Outpatient Prescriptions  Medication Sig Dispense Refill  . aspirin 325 MG EC tablet Take 325 mg by mouth daily.      . Blood Glucose Monitoring Suppl (ONE TOUCH ULTRA 2) W/DEVICE KIT Use two times a day to test blood sugar     . fenofibrate (TRICOR) 145 MG tablet Take 1 tablet (145 mg total) by mouth daily. 90 tablet 2  . glimepiride (AMARYL) 4 MG tablet Take 1 tablet (4 mg total) by mouth daily before breakfast. 30 tablet 11  . glucose blood (  ONETOUCH VERIO) test strip 1 each by Other route 2 (two) times daily. And lancets 2/day 250.01 100 each 12  . olmesartan (BENICAR) 20 MG tablet Take 1 tablet (20 mg total) by mouth daily. 90 tablet 3  . rosuvastatin (CRESTOR) 20 MG tablet Take 1 tablet (20 mg total) by mouth daily. 90 tablet 2  . sitaGLIPtin (JANUVIA) 100 MG tablet Take 1 tablet (100 mg total) by mouth daily. 30 tablet 11  . Na Sulfate-K Sulfate-Mg Sulf 17.5-3.13-1.6 GM/180ML SOLN Take 1 kit by mouth once. 354 mL 0   No current facility-administered medications for this visit.        ___________________________________________________________________ Objective   Exam:  BP 118/80   Pulse 76   Ht '5\' 8"'  (1.727 m)   Wt 219 lb (99.3 kg)   BMI 33.30 kg/m    General: this is a(n) Well-appearing man   Eyes: sclera anicteric, no redness  ENT: oral mucosa moist without lesions, no cervical or supraclavicular lymphadenopathy, good dentition  CV: RRR without murmur, S1/S2, no JVD, no peripheral edema  Resp: clear to auscultation bilaterally, normal RR and effort noted  GI: soft, overweight, no tenderness, with active bowel sounds. No guarding or palpable organomegaly noted.  Skin; warm and dry, no rash or jaundice noted  Neuro: awake, alert and oriented x 3. Normal gross motor function and fluent speech  Labs:  CBC Latest Ref Rng & Units 01/20/2017 09/21/2016 09/03/2015  WBC 4.0 - 10.5 K/uL 8.5 8.2 7.4  Hemoglobin 13.0 - 17.0 g/dL 12.9(L) 12.3(L) 14.5  Hematocrit 39.0 - 52.0 % 39.8 35.5(L) 41.3  Platelets 150.0 - 400.0 K/uL 328.0 857(H) 392.0   Iron 41 B12 195  Assessment: Encounter Diagnoses  Name Primary?  . B12 deficiency Yes  . Special screening for malignant neoplasms, colon     His iron level is slightly below normal with a normal hemoglobin and no digestive symptoms. I do not think he needs to be worked up for sources of occult GI blood loss.  Plan:  Screening colonoscopy as scheduled. He is agreeable after a thorough discussion of the procedure and risks.  The benefits and risks of the planned procedure were described in detail with the patient or (when appropriate) their health care proxy.  Risks were outlined as including, but not limited to, bleeding, infection, perforation, adverse medication reaction leading to cardiac or pulmonary decompensation, or pancreatitis (if ERCP).  The limitation of incomplete mucosal visualization was also discussed.  No guarantees or warranties were given.   Thank you for the courtesy of this consult.   Please call me with any questions or concerns.  Nelida Meuse III  CC: Scarlette Calico, MD

## 2017-02-02 NOTE — Patient Instructions (Signed)
If you are age 51 or older, your body mass index should be between 23-30. Your Body mass index is 33.3 kg/m. If this is out of the aforementioned range listed, please consider follow up with your Primary Care Provider.  If you are age 58 or younger, your body mass index should be between 19-25. Your Body mass index is 33.3 kg/m. If this is out of the aformentioned range listed, please consider follow up with your Primary Care Provider.   You have been scheduled for a colonoscopy. Please follow written instructions given to you at your visit today.  Please pick up your prep supplies at the pharmacy within the next 1-3 days. If you use inhalers (even only as needed), please bring them with you on the day of your procedure. Your physician has requested that you go to www.startemmi.com and enter the access code given to you at your visit today. This web site gives a general overview about your procedure. However, you should still follow specific instructions given to you by our office regarding your preparation for the procedure.  Thank you for choosing  GI  Dr Wilfrid Lund III

## 2017-02-21 ENCOUNTER — Telehealth: Payer: Self-pay | Admitting: Endocrinology

## 2017-02-22 ENCOUNTER — Encounter: Payer: Self-pay | Admitting: Endocrinology

## 2017-02-22 ENCOUNTER — Ambulatory Visit (INDEPENDENT_AMBULATORY_CARE_PROVIDER_SITE_OTHER): Payer: Federal, State, Local not specified - PPO | Admitting: Endocrinology

## 2017-02-22 VITALS — BP 122/82 | HR 77 | Ht 68.0 in | Wt 218.0 lb

## 2017-02-22 DIAGNOSIS — E118 Type 2 diabetes mellitus with unspecified complications: Secondary | ICD-10-CM

## 2017-02-22 LAB — POCT GLYCOSYLATED HEMOGLOBIN (HGB A1C): Hemoglobin A1C: 8

## 2017-02-22 NOTE — Progress Notes (Signed)
Subjective:    Patient ID: Tim Wilson, male    DOB: 08-09-1966, 51 y.o.   MRN: [id number]  HPI Pt returns for f/u of diabetes mellitus: DM type: 2 Dx'ed: 7341 Complications: none Therapy: 2 oral meds DKA: never Severe hypoglycemia: never.  Pancreatitis: never.   Other: he took insulin in 2012, and 2014-2015; he did not tolerate metformin-XR or invokana (diarrhea with both).  Interval hx: pt states he feels well in general.  He says cbg's are in the 100's fasting.  He does not check at other times of day. He says [place] causes palpitations.  Past Medical History:  Diagnosis Date  . Diabetes mellitus   . Hyperlipidemia   . Hypertension     No past surgical history on file.  Social History   Social History  . Marital status: Married    Spouse name: N/A  . Number of children: N/A  . Years of education: N/A   Occupational History  . 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 Smoking status: Never Smoker  . Smokeless tobacco: Never Used  . Alcohol use 1.2 oz/week    2 Cans of beer per week     Comment: on occasion, not in excess  . Drug use: No  . Sexual activity: Yes   Other Topics Concern  . Not on file   Social History Narrative   Regular exercise-yes    Current Outpatient Prescriptions on File Prior to Visit  Medication Sig Dispense Refill  . aspirin 325 MG EC tablet Take 325 mg by mouth daily.      . Blood Glucose Monitoring Suppl (ONE TOUCH ULTRA 2) W/DEVICE KIT Use two times a day to test blood sugar     . fenofibrate (TRICOR) 145 MG tablet Take 1 tablet (145 mg total) by mouth daily. 90 tablet 2  . glimepiride (AMARYL) 4 MG tablet Take 1 tablet (4 mg total) by mouth daily before breakfast. 30 tablet 11  . glucose blood (ONETOUCH VERIO) test strip 1 each by Other route 2 (two) times daily. And lancets 2/day 250.01 100 each 12  . olmesartan (BENICAR) 20 MG tablet Take 1  tablet (20 mg total) by mouth daily. 90 tablet 3  . rosuvastatin (CRESTOR) 20 MG tablet Take 1 tablet (20 mg total) by mouth daily. 90 tablet 2  . sitaGLIPtin (JANUVIA) 100 MG tablet Take 1 tablet (100 mg total) by mouth daily. 30 tablet 11  . [DISCONTINUED] colchicine 0.6 MG tablet Take 1 tablet (0.6 mg total) by mouth 2 (two) times daily. 60 tablet 2   No current facility-administered medications on file prior to visit.     Allergies  Allergen Reactions  . Ramipril     cough    Family History  Problem Relation Age of Onset  . Heart disease Father   . Heart disease Brother   . Diabetes Mother   . Breast cancer Cousin   . Colon cancer Neg Hx   . Stomach cancer Neg Hx   . Esophageal cancer Neg Hx     BP 122/82   Pulse 77   Ht '5\' 8"'  (1.727 m)   Wt 218 lb (98.9 kg)   SpO2 95%   BMI 33.15 kg/m    Review of Systems He denies hypoglycemia.      Objective:   Physical Exam VITAL SIGNS:  See vs page GENERAL: no distress Pulses: dorsalis  pedis intact bilat.   MSK: no deformity of the feet CV: no leg edema Skin:  no ulcer on the feet.  normal color and temp on the feet. Neuro: sensation is intact to touch on the feet.   A1c=8.0%    Assessment & Plan:  Type 2 DM: he needs increased rx.  he declines to add another medication.    Patient Instructions  check your blood sugar twice a day.  vary the time of day when you check, between before the 3 meals, and at bedtime.  also check if you have symptoms of your blood sugar being too high or too low.  please keep a record of the readings and bring it to your next appointment here.  You can write it on any piece of paper.  please call us sooner if your blood sugar goes below 70, or if you have a lot of readings over 200.   good diet significantly improves the control of your diabetes.  please let me know if you wish to be referred to a dietician.  Please continue the same medications for now.   If we need to, we can resume the  "pioglitizone," or change januvia to "trulicity."  Please come back for a follow-up appointment in 2 months.

## 2017-02-22 NOTE — Patient Instructions (Addendum)
check your blood sugar twice a day.  vary the time of day when you check, between before the 3 meals, and at bedtime.  also check if you have symptoms of your blood sugar being too high or too low.  please keep a record of the readings and bring it to your next appointment here.  You can write it on any piece of paper.  please call us sooner if your blood sugar goes below 70, or if you have a lot of readings over 200.   good diet significantly improves the control of your diabetes.  please let me know if you wish to be referred to a dietician.  Please continue the same medications for now.   If we need to, we can resume the "pioglitizone," or change januvia to "trulicity."  Please come back for a follow-up appointment in 2 months.

## 2017-02-24 ENCOUNTER — Ambulatory Visit (INDEPENDENT_AMBULATORY_CARE_PROVIDER_SITE_OTHER): Payer: Federal, State, Local not specified - PPO | Admitting: General Practice

## 2017-02-24 DIAGNOSIS — E538 Deficiency of other specified B group vitamins: Secondary | ICD-10-CM

## 2017-02-24 MED ORDER — CYANOCOBALAMIN 1000 MCG/ML IJ SOLN
1000.0000 ug | Freq: Once | INTRAMUSCULAR | Status: AC
  Administered 2017-02-24: 1000 ug via INTRAMUSCULAR

## 2017-03-14 ENCOUNTER — Encounter: Payer: Self-pay | Admitting: Gastroenterology

## 2017-03-16 NOTE — Telephone Encounter (Signed)
Cancelled due to inactivity 

## 2017-03-28 ENCOUNTER — Encounter: Payer: Self-pay | Admitting: Gastroenterology

## 2017-03-28 ENCOUNTER — Ambulatory Visit: Payer: Federal, State, Local not specified - PPO

## 2017-03-28 ENCOUNTER — Ambulatory Visit (AMBULATORY_SURGERY_CENTER): Payer: Federal, State, Local not specified - PPO | Admitting: Gastroenterology

## 2017-03-28 VITALS — BP 120/85 | HR 72 | Temp 98.6°F | Resp 16 | Ht 68.0 in | Wt 219.0 lb

## 2017-03-28 DIAGNOSIS — Z1212 Encounter for screening for malignant neoplasm of rectum: Secondary | ICD-10-CM

## 2017-03-28 DIAGNOSIS — D120 Benign neoplasm of cecum: Secondary | ICD-10-CM | POA: Diagnosis not present

## 2017-03-28 DIAGNOSIS — Z1211 Encounter for screening for malignant neoplasm of colon: Secondary | ICD-10-CM | POA: Diagnosis present

## 2017-03-28 LAB — HM COLONOSCOPY

## 2017-03-28 MED ORDER — SODIUM CHLORIDE 0.9 % IV SOLN: 500.0000 mL | INTRAVENOUS | Status: DC

## 2017-03-28 NOTE — Patient Instructions (Signed)
YOU HAD AN ENDOSCOPIC PROCEDURE TODAY AT [facility]:   Refer to the procedure report that was given to you for any specific questions about what was found during the examination.  If the procedure report does not answer your questions, please call your gastroenterologist to clarify.  If you requested that your care partner not be given the details of your procedure findings, then the procedure report has been included in a sealed envelope for you to review at your convenience later.  YOU SHOULD EXPECT: Some feelings of bloating in the abdomen. Passage of more gas than usual.  Walking can help get rid of the air that was put into your GI tract during the procedure and reduce the bloating. If you had a lower endoscopy (such as a colonoscopy or flexible sigmoidoscopy) you may notice spotting of blood in your stool or on the toilet paper. If you underwent a bowel prep for your procedure, you may not have a normal bowel movement for a few days.  Please Note:  You might notice some irritation and congestion in your nose or some drainage.  This is from the oxygen used during your procedure.  There is no need for concern and it should clear up in a day or so.  SYMPTOMS TO REPORT IMMEDIATELY:   Following lower endoscopy (colonoscopy or flexible sigmoidoscopy):  Excessive amounts of blood in the stool  Significant tenderness or worsening of abdominal pains  Swelling of the abdomen that is new, acute  Fever of 100F or higher  For urgent or emergent issues, a gastroenterologist can be reached at any hour by calling [phone].   DIET:  We do recommend a small meal at first, but then you may proceed to your regular diet.  Drink plenty of fluids but you should avoid alcoholic beverages for 24 hours.  ACTIVITY:  You should plan to take it easy for the rest of today and you should NOT DRIVE or use heavy machinery until tomorrow (because of the sedation medicines used during the test).     FOLLOW UP: Our staff will call the number listed on your records the next business day following your procedure to check on you and address any questions or concerns that you may have regarding the information given to you following your procedure. If we do not reach you, we will leave a message.  However, if you are feeling well and you are not experiencing any problems, there is no need to return our call.  We will assume that you have returned to your regular daily activities without incident.  If any biopsies were taken you will be contacted by phone or by letter within the next 1-3 weeks.  Please call us at [phone] if you have not heard about the biopsies in 3 weeks.    SIGNATURES/CONFIDENTIALITY: You and/or your care partner have signed paperwork which will be entered into your electronic medical record.  These signatures attest to the fact that that the information above on your After Visit Summary has been reviewed and is understood.  Full responsibility of the confidentiality of this discharge information lies with you and/or your care-partner.  Polyps, diverticulosis, and high fiber diet information given.

## 2017-03-28 NOTE — Progress Notes (Signed)
Called to room to assist during endoscopic procedure.  Patient ID and intended procedure confirmed with present staff. Received instructions for my participation in the procedure from the performing physician.  

## 2017-03-28 NOTE — Progress Notes (Signed)
Report given to PACU, vss 

## 2017-03-28 NOTE — Op Note (Signed)
[place] Patient Name: Tim Wilson Procedure Date: 03/28/2017 1:41 PM MRN: [id number] Endoscopist: Mallie Mussel L. Loletha Carrow , MD Age: 51 Referring MD:  Date of Birth: Nov 28, 1965 Gender: Male Account #: [id number] Procedure:                Colonoscopy Indications:              Screening for colorectal malignant neoplasm, This                            is the patient's first colonoscopy Medicines:                Monitored Anesthesia Care Procedure:                Pre-Anesthesia Assessment:                           - Prior to the procedure, a History and Physical                            was performed, and patient medications and                            allergies were reviewed. The patient's tolerance of                            previous anesthesia was also reviewed. The risks                            and benefits of the procedure and the sedation                            options and risks were discussed with the patient.                            All questions were answered, and informed consent                            was obtained. Anticoagulants: The patient has taken                            aspirin. It was decided not to withhold this                            medication prior to the procedure. ASA Grade                            Assessment: II - A patient with mild systemic                            disease. After reviewing the risks and benefits,                            the patient was deemed in satisfactory condition to  undergo the procedure.                           After obtaining informed consent, the colonoscope                            was passed under direct vision. Throughout the                            procedure, the patient's blood pressure, pulse, and                            oxygen saturations were monitored continuously. The                            Colonoscope was introduced through the anus and                        advanced to the the cecum, identified by                            appendiceal orifice and ileocecal valve. The                            colonoscopy was performed without difficulty. The                            patient tolerated the procedure well. The quality                            of the bowel preparation was excellent. The                            ileocecal valve, appendiceal orifice, and rectum                            were photographed. The quality of the bowel                            preparation was evaluated using the BBPS [facility]                            Bowel Preparation Scale) with scores of: Right                            Colon = 3, Transverse Colon = 3 and Left Colon = 3.                            The total BBPS score equals 9. The bowel                            preparation used was SUPREP. Scope In: 1:48:10 PM Scope Out: 1:59:27 PM Scope Withdrawal Time: 0 hours 9 minutes 52 seconds  Total Procedure Duration: 0 hours 11 minutes 17  seconds  Findings:                 The perianal and digital rectal examinations were                            normal.                           Two sessile polyps were found in the cecum. The                            polyps were 1 to 2 mm in size. These polyps were                            removed with a cold snare. Resection and retrieval                            were complete.                           A few small-mouthed diverticula were found in the                            right colon.                           The exam was otherwise without abnormality on                            direct and retroflexion views. Complications:            No immediate complications. Estimated Blood Loss:     Estimated blood loss: none. Impression:               - Two 1 to 2 mm polyps in the cecum, removed with a                            cold snare. Resected and retrieved.                           -  Diverticulosis in the right colon.                           - The examination was otherwise normal on direct                            and retroflexion views. Recommendation:           - Patient has a contact number available for                            emergencies. The signs and symptoms of potential                            delayed complications were discussed with the  patient. Return to normal activities tomorrow.                            Written discharge instructions were provided to the                            patient.                           - Resume previous diet.                           - Continue present medications.                           - Await pathology results.                           - Repeat colonoscopy is recommended for                            surveillance. The colonoscopy date will be                            determined after pathology results from today's                            exam become available for review. Henry L. Loletha Carrow, MD 03/28/2017 2:03:45 PM This report has been signed electronically.

## 2017-03-28 NOTE — Progress Notes (Signed)
Pt's states no medical or surgical changes since previsit or office visit. 

## 2017-03-29 ENCOUNTER — Telehealth: Payer: Self-pay

## 2017-03-29 ENCOUNTER — Ambulatory Visit (INDEPENDENT_AMBULATORY_CARE_PROVIDER_SITE_OTHER): Payer: Federal, State, Local not specified - PPO

## 2017-03-29 ENCOUNTER — Telehealth: Payer: Self-pay | Admitting: *Deleted

## 2017-03-29 DIAGNOSIS — E538 Deficiency of other specified B group vitamins: Secondary | ICD-10-CM

## 2017-03-29 MED ORDER — CYANOCOBALAMIN 1000 MCG/ML IJ SOLN
1000.0000 ug | Freq: Once | INTRAMUSCULAR | Status: AC
  Administered 2017-03-29: 1000 ug via INTRAMUSCULAR

## 2017-03-29 NOTE — Telephone Encounter (Signed)
  Follow up Call-  Call back number 03/28/2017  Post procedure Call Back phone  # [phone]  Permission to leave phone message Yes  Some recent data might be hidden     Patient questions:  Message left to call us if necessary.  Second call.

## 2017-03-29 NOTE — Telephone Encounter (Signed)
Attempted to reach pt.with follow up call following endoscopic procedure 03/29/2017.   Will try to reach pt.again later today.

## 2017-04-07 ENCOUNTER — Encounter: Payer: Self-pay | Admitting: Gastroenterology

## 2017-04-07 LAB — HM COLONOSCOPY

## 2017-04-25 ENCOUNTER — Ambulatory Visit (INDEPENDENT_AMBULATORY_CARE_PROVIDER_SITE_OTHER): Payer: Federal, State, Local not specified - PPO | Admitting: Endocrinology

## 2017-04-25 ENCOUNTER — Encounter: Payer: Self-pay | Admitting: Endocrinology

## 2017-04-25 VITALS — BP 122/84 | HR 67 | Wt 219.2 lb

## 2017-04-25 DIAGNOSIS — E118 Type 2 diabetes mellitus with unspecified complications: Secondary | ICD-10-CM

## 2017-04-25 LAB — HEMOGLOBIN A1C: HEMOGLOBIN A1C: 8.5 % — AB (ref 4.6–6.5)

## 2017-04-25 MED ORDER — CANAGLIFLOZIN 100 MG PO TABS: 100.0000 mg | ORAL_TABLET | Freq: Every day | ORAL | 3 refills | Status: DC

## 2017-04-25 NOTE — Progress Notes (Signed)
Subjective:    Patient ID: Tim Wilson, male    DOB: 09/04/1966, 51 y.o.   MRN: [id number]  HPI Pt returns for f/u of diabetes mellitus: DM type: 2 Dx'ed: 9747 Complications: none Therapy: 2 oral meds DKA: never Severe hypoglycemia: never.  Pancreatitis: never.   Other: he took insulin in 2012, and 2014-2015; he did not tolerate metformin-XR or invokana (diarrhea with both); he stopped pioglitizone due to fatigue and DOE Interval hx: pt states he feels well in general.  no cbg record, but states cbg's are well-controlled. He is willing to re-try the invokana.  Past Medical History:  Diagnosis Date  . Diabetes mellitus   . Hyperlipidemia   . Hypertension     No past surgical history on file.  Social History   Social History  . Marital status: Married    Spouse name: N/A  . Number of children: N/A  . Years of education: N/A   Occupational History  . 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 Smoking status: Never Smoker  . Smokeless tobacco: Never Used  . Alcohol use 1.2 oz/week    2 Cans of beer per week     Comment: on occasion, not in excess  . Drug use: No  . Sexual activity: Yes   Other Topics Concern  . Not on file   Social History Narrative   Regular exercise-yes    Current Outpatient Prescriptions on File Prior to Visit  Medication Sig Dispense Refill  . aspirin 325 MG EC tablet Take 325 mg by mouth daily.      . Blood Glucose Monitoring Suppl (ONE TOUCH ULTRA 2) W/DEVICE KIT Use two times a day to test blood sugar     . fenofibrate (TRICOR) 145 MG tablet Take 1 tablet (145 mg total) by mouth daily. 90 tablet 2  . glimepiride (AMARYL) 4 MG tablet Take 1 tablet (4 mg total) by mouth daily before breakfast. 30 tablet 11  . glucose blood (ONETOUCH VERIO) test strip 1 each by Other route 2 (two) times daily. And lancets 2/day 250.01 100 each 12  . olmesartan (BENICAR) 20 MG  tablet Take 1 tablet (20 mg total) by mouth daily. 90 tablet 3  . rosuvastatin (CRESTOR) 20 MG tablet Take 1 tablet (20 mg total) by mouth daily. 90 tablet 2  . sitaGLIPtin (JANUVIA) 100 MG tablet Take 1 tablet (100 mg total) by mouth daily. 30 tablet 11  . [DISCONTINUED] colchicine 0.6 MG tablet Take 1 tablet (0.6 mg total) by mouth 2 (two) times daily. 60 tablet 2   Current Facility-Administered Medications on File Prior to Visit  Medication Dose Route Frequency Provider Last Rate Last Dose  . 0.9 %  sodium chloride infusion  500 mL Intravenous Continuous Doran Stabler, MD        Allergies  Allergen Reactions  . Ramipril     cough    Family History  Problem Relation Age of Onset  . Heart disease Father   . Heart disease Brother   . Diabetes Mother   . Breast cancer Cousin   . Colon cancer Neg Hx   . Stomach cancer Neg Hx   . Esophageal cancer Neg Hx     BP 122/84   Pulse 67   Wt 219 lb 3.2 oz (99.4 kg)   SpO2 97%   BMI 33.33 kg/m   Review of Systems He  denies hypoglycemia    Objective:   Physical Exam VITAL SIGNS:  See vs page GENERAL: no distress Pulses: foot pulses are intact bilaterally.   MSK: no deformity of the feet or ankles.  CV: no edema of the legs or ankles Skin:  no ulcer on the feet or ankles.  normal color and temp on the feet and ankles Neuro: sensation is intact to touch on the feet and ankles.    Lab Results  Component Value Date   CREATININE 1.13 01/20/2017   BUN 16 01/20/2017   NA 139 01/20/2017   K 4.2 01/20/2017   CL 105 01/20/2017   CO2 28 01/20/2017   Lab Results  Component Value Date   HGBA1C 8.5 (H) 04/25/2017       Assessment & Plan:  Type 2 DM: worse.  I have sent a prescription to your pharmacy, to add invokana. Diarrhea and DOE: these limits rx options.

## 2017-04-25 NOTE — Patient Instructions (Addendum)
check your blood sugar twice a day.  vary the time of day when you check, between before the 3 meals, and at bedtime.  also check if you have symptoms of your blood sugar being too high or too low.  please keep a record of the readings and bring it to your next appointment here.  You can write it on any piece of paper.  please call us sooner if your blood sugar goes below 70, or if you have a lot of readings over 200.   blood tests are requested for you today.  We'll let you know about the results. If we need to, we can re-try the invokana.  We could also change the [place] to "trulicity."  Please come back for a follow-up appointment in 3 months.

## 2017-04-26 MED ORDER — DAPAGLIFLOZIN PROPANEDIOL 5 MG PO TABS: 5.0000 mg | ORAL_TABLET | Freq: Every day | ORAL | 11 refills | Status: DC

## 2017-04-28 ENCOUNTER — Ambulatory Visit: Payer: Federal, State, Local not specified - PPO

## 2017-04-29 MED FILL — FARXIGA 5 MG TABLET: 5 | 30 days supply | Qty: 30 | Fill #0

## 2017-05-02 ENCOUNTER — Telehealth: Payer: Self-pay | Admitting: Endocrinology

## 2017-05-02 NOTE — Telephone Encounter (Signed)
Please call [facility] to follow up on PA for invokana

## 2017-05-02 NOTE — Telephone Encounter (Signed)
Pa has been submitted for the patient. Waiting on a response to see if it has been approved or not.

## 2017-05-05 ENCOUNTER — Other Ambulatory Visit: Payer: Self-pay

## 2017-05-05 MED ORDER — DAPAGLIFLOZIN PROPANEDIOL 5 MG PO TABS: 5.0000 mg | ORAL_TABLET | Freq: Every day | ORAL | 11 refills | Status: DC

## 2017-05-05 MED FILL — GLIMEPIRIDE 4 MG TABLET: 4 | 30 days supply | Qty: 30 | Fill #2

## 2017-07-12 ENCOUNTER — Other Ambulatory Visit: Payer: Self-pay | Admitting: Internal Medicine

## 2017-07-12 DIAGNOSIS — I10 Essential (primary) hypertension: Secondary | ICD-10-CM

## 2017-07-12 MED FILL — ROSUVASTATIN CALCIUM 20 MG: 20 | 90 days supply | Qty: 90 | Fill #2

## 2017-07-12 MED FILL — OLMESARTAN MEDOXOMIL 20 MG: 20 | 90 days supply | Qty: 90 | Fill #0

## 2017-07-26 ENCOUNTER — Ambulatory Visit (INDEPENDENT_AMBULATORY_CARE_PROVIDER_SITE_OTHER): Payer: Federal, State, Local not specified - PPO | Admitting: Endocrinology

## 2017-07-26 VITALS — BP 124/80 | HR 75 | Temp 98.2°F | Wt 215.4 lb

## 2017-07-26 DIAGNOSIS — E118 Type 2 diabetes mellitus with unspecified complications: Secondary | ICD-10-CM | POA: Diagnosis not present

## 2017-07-26 LAB — POCT GLYCOSYLATED HEMOGLOBIN (HGB A1C): Hemoglobin A1C: 8.5

## 2017-07-26 MED ORDER — DAPAGLIFLOZIN PROPANEDIOL 10 MG PO TABS: 10.0000 mg | ORAL_TABLET | Freq: Every day | ORAL | 3 refills | Status: DC

## 2017-07-26 NOTE — Progress Notes (Signed)
Subjective:    Patient ID: Tim Wilson, male    DOB: 10/03/1966, 51 y.o.   MRN: [id number]  HPI Pt returns for f/u of diabetes mellitus: DM type: 2 Dx'ed: 5625 Complications: none Therapy: 3 oral meds DKA: never Severe hypoglycemia: never.  Pancreatitis: never.   Other: he took insulin in 2012, and 2014-2015; he did not tolerate metformin-XR or invokana (diarrhea with both); he stopped pioglitizone due to fatigue and DOE.  Interval hx: pt states he feels well in general.  no cbg record, but states cbg's vary from 90-230.   Past Medical History:  Diagnosis Date  . Diabetes mellitus   . Hyperlipidemia   . Hypertension     No past surgical history on file.  Social History   Social History  . Marital status: Married    Spouse name: N/A  . Number of children: N/A  . Years of education: N/A   Occupational History  . 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 Smoking status: Never Smoker  . Smokeless tobacco: Never Used  . Alcohol use 1.2 oz/week    2 Cans of beer per week     Comment: on occasion, not in excess  . Drug use: No  . Sexual activity: Yes   Other Topics Concern  . Not on file   Social History Narrative   Regular exercise-yes    Current Outpatient Prescriptions on File Prior to Visit  Medication Sig Dispense Refill  . aspirin 325 MG EC tablet Take 325 mg by mouth daily.      . Blood Glucose Monitoring Suppl (ONE TOUCH ULTRA 2) W/DEVICE KIT Use two times a day to test blood sugar     . fenofibrate (TRICOR) 145 MG tablet Take 1 tablet (145 mg total) by mouth daily. 90 tablet 2  . glimepiride (AMARYL) 4 MG tablet Take 1 tablet (4 mg total) by mouth daily before breakfast. 30 tablet 11  . glucose blood (ONETOUCH VERIO) test strip 1 each by Other route 2 (two) times daily. And lancets 2/day 250.01 100 each 12  . olmesartan (BENICAR) 20 MG tablet Take 1 tablet (20 mg total) by  mouth daily. 90 tablet 3  . rosuvastatin (CRESTOR) 20 MG tablet Take 1 tablet (20 mg total) by mouth daily. 90 tablet 2  . sitaGLIPtin (JANUVIA) 100 MG tablet Take 1 tablet (100 mg total) by mouth daily. 30 tablet 11  . [DISCONTINUED] colchicine 0.6 MG tablet Take 1 tablet (0.6 mg total) by mouth 2 (two) times daily. 60 tablet 2   Current Facility-Administered Medications on File Prior to Visit  Medication Dose Route Frequency Provider Last Rate Last Dose  . 0.9 %  sodium chloride infusion  500 mL Intravenous Continuous Doran Stabler, MD        Allergies  Allergen Reactions  . Ramipril     cough    Family History  Problem Relation Age of Onset  . Heart disease Father   . Heart disease Brother   . Diabetes Mother   . Breast cancer Cousin   . Colon cancer Neg Hx   . Stomach cancer Neg Hx   . Esophageal cancer Neg Hx     BP 124/80   Pulse 75   Temp 98.2 F (36.8 C) (Oral)   Wt 215 lb 6.4 oz (97.7 kg)   SpO2 97%   BMI 32.75 kg/m  Review of Systems He denies hypoglycemia.      Objective:   Physical Exam VITAL SIGNS:  See vs page GENERAL: no distress Pulses: foot pulses are intact bilaterally.   MSK: no deformity of the feet or ankles.  CV: no edema of the legs or ankles Skin:  no ulcer on the feet or ankles.  normal color and temp on the feet and ankles Neuro: sensation is intact to touch on the feet and ankles.      Lab Results  Component Value Date   CREATININE 1.13 01/20/2017   BUN 16 01/20/2017   NA 139 01/20/2017   K 4.2 01/20/2017   CL 105 01/20/2017   CO2 28 01/20/2017     A1c=8.5%    Assessment & Plan:  Type 2 DM: he needs increased rx.  We discussed.  he declines to add parlodel, or to change januvia, to trulicity, at least for now.   Patient Instructions  check your blood sugar twice a day.  vary the time of day when you check, between before the 3 meals, and at bedtime.  also check if you have symptoms of your blood sugar being too high  or too low.  please keep a record of the readings and bring it to your next appointment here.  You can write it on any piece of paper.  please call us sooner if your blood sugar goes below 70, or if you have a lot of readings over 200.   I have sent a prescription to your pharmacy, to double the trulicity.  Please come back for a follow-up appointment in 3 months.

## 2017-07-26 NOTE — Patient Instructions (Addendum)
check your blood sugar twice a day.  vary the time of day when you check, between before the 3 meals, and at bedtime.  also check if you have symptoms of your blood sugar being too high or too low.  please keep a record of the readings and bring it to your next appointment here.  You can write it on any piece of paper.  please call us sooner if your blood sugar goes below 70, or if you have a lot of readings over 200.   I have sent a prescription to your pharmacy, to double the trulicity.  Please come back for a follow-up appointment in 3 months.

## 2017-07-26 NOTE — Progress Notes (Signed)
Pre visit review using our clinic review tool, if applicable. No additional management support is needed unless otherwise documented below in the visit note. 

## 2017-07-27 ENCOUNTER — Encounter: Payer: Self-pay | Admitting: Endocrinology

## 2017-07-27 ENCOUNTER — Ambulatory Visit (INDEPENDENT_AMBULATORY_CARE_PROVIDER_SITE_OTHER): Payer: Federal, State, Local not specified - PPO

## 2017-07-27 DIAGNOSIS — Z23 Encounter for immunization: Secondary | ICD-10-CM | POA: Diagnosis not present

## 2017-11-01 ENCOUNTER — Encounter: Payer: Self-pay | Admitting: Endocrinology

## 2017-11-01 ENCOUNTER — Ambulatory Visit (INDEPENDENT_AMBULATORY_CARE_PROVIDER_SITE_OTHER): Payer: Federal, State, Local not specified - PPO | Admitting: Endocrinology

## 2017-11-01 VITALS — BP 136/90 | HR 80 | Wt 210.2 lb

## 2017-11-01 DIAGNOSIS — E118 Type 2 diabetes mellitus with unspecified complications: Secondary | ICD-10-CM | POA: Diagnosis not present

## 2017-11-01 LAB — POCT GLYCOSYLATED HEMOGLOBIN (HGB A1C): Hemoglobin A1C: 9.2

## 2017-11-01 MED ORDER — INSULIN ASPART 100 UNIT/ML FLEXPEN: 5.0000 [IU] | PEN_INJECTOR | Freq: Three times a day (TID) | SUBCUTANEOUS | 11 refills | Status: DC

## 2017-11-01 MED FILL — NOVOLOG FLEXPEN SYRINGE: 100 | 80 days supply | Qty: 12 | Fill #0

## 2017-11-01 NOTE — Patient Instructions (Addendum)
check your blood sugar twice a day.  vary the time of day when you check, between before the 3 meals, and at bedtime.  also check if you have symptoms of your blood sugar being too high or too low.  please keep a record of the readings and bring it to your next appointment here.  You can write it on any piece of paper.  please call us sooner if your blood sugar goes below 70, or if you have a lot of readings over 200.   I have sent a prescription to your pharmacy, to add "Novolog." Please come back for a follow-up appointment in 2 months.

## 2017-11-01 NOTE — Progress Notes (Signed)
Subjective:    Patient ID: Tim Wilson, male    DOB: 09-Mar-1966, 52 y.o.   MRN: [id number]  HPI Pt returns for f/u of diabetes mellitus: DM type: Insulin-requiring type 2.   Dx'ed: 0272 Complications: none Therapy: 3 oral meds DKA: never Severe hypoglycemia: never.  Pancreatitis: never.   Other: he took insulin in 2012, and 2014-2015; he did not tolerate metformin-XR or invokana (diarrhea with both); he stopped pioglitizone due to fatigue and DOE; he works 1st shift.   Interval hx: pt states he feels well in general.  no cbg record, but states cbg's vary from 100-300.  It is in general higher as the day goes on.   Past Medical History:  Diagnosis Date  . Diabetes mellitus   . Hyperlipidemia   . Hypertension     History reviewed. No pertinent surgical history.  Social History   Socioeconomic History  . Marital status: Married    Spouse name: Not on file  . Number of children: Not on file  . Years of education: Not on file  . Highest education level: Not on file  Social Needs  . Financial resource strain: Not on file  . Food insecurity - worry: Not on file  . Food insecurity - inability: Not on file  . Transportation needs - medical: Not on file  . Transportation needs - non-medical: Not on file  Occupational History  . 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 Smoking status: Never Smoker  . Smokeless tobacco: Never Used  Substance and Sexual Activity  . Alcohol use: Yes    Alcohol/week: 1.2 oz    Types: 2 Cans of beer per week    Comment: on occasion, not in excess  . Drug use: No  . Sexual activity: Yes  Other Topics Concern  . Not on file  Social History Narrative   Regular exercise-yes    Current Outpatient Medications on File Prior to Visit  Medication Sig Dispense Refill  . aspirin 325 MG EC tablet Take 325 mg by mouth daily.      . Blood Glucose  Monitoring Suppl (ONE TOUCH ULTRA 2) W/DEVICE KIT Use two times a day to test blood sugar     . dapagliflozin propanediol (FARXIGA) 10 MG TABS tablet Take 10 mg by mouth daily. 90 tablet 3  . fenofibrate (TRICOR) 145 MG tablet Take 1 tablet (145 mg total) by mouth daily. 90 tablet 2  . glimepiride (AMARYL) 4 MG tablet Take 1 tablet (4 mg total) by mouth daily before breakfast. 30 tablet 11  . glucose blood (ONETOUCH VERIO) test strip 1 each by Other route 2 (two) times daily. And lancets 2/day 250.01 100 each 12  . olmesartan (BENICAR) 20 MG tablet Take 1 tablet (20 mg total) by mouth daily. 90 tablet 3  . rosuvastatin (CRESTOR) 20 MG tablet Take 1 tablet (20 mg total) by mouth daily. 90 tablet 2  . sitaGLIPtin (JANUVIA) 100 MG tablet Take 1 tablet (100 mg total) by mouth daily. 30 tablet 11  . [DISCONTINUED] colchicine 0.6 MG tablet Take 1 tablet (0.6 mg total) by mouth 2 (two) times daily. 60 tablet 2   Current Facility-Administered Medications on File Prior to Visit  Medication Dose Route Frequency Provider Last Rate Last Dose  . 0.9 %  sodium chloride infusion  500 mL Intravenous Continuous Danis, Kirke Corin, MD  Allergies  Allergen Reactions  . Ramipril     cough    Family History  Problem Relation Age of Onset  . Heart disease Father   . Heart disease Brother   . Diabetes Mother   . Breast cancer Cousin   . Colon cancer Neg Hx   . Stomach cancer Neg Hx   . Esophageal cancer Neg Hx     BP 136/90 (BP Location: Left Arm, Patient Position: Sitting, Cuff Size: Normal)   Pulse 80   Wt 210 lb 3.2 oz (95.3 kg)   SpO2 96%   BMI 31.96 kg/m    Review of Systems He denies hypoglycemia.      Objective:   Physical Exam VITAL SIGNS:  See vs page GENERAL: no distress Pulses: foot pulses are intact bilaterally.   MSK: no deformity of the feet or ankles.  CV: no edema of the legs or ankles Skin:  no ulcer on the feet or ankles.  normal color and temp on the feet and  ankles.   Neuro: sensation is intact to touch on the feet and ankles.    Lab Results  Component Value Date   HGBA1C 9.2 11/01/2017       Assessment & Plan:  Insulin-requiring type 2 DM: worse   Patient Instructions  check your blood sugar twice a day.  vary the time of day when you check, between before the 3 meals, and at bedtime.  also check if you have symptoms of your blood sugar being too high or too low.  please keep a record of the readings and bring it to your next appointment here.  You can write it on any piece of paper.  please call us sooner if your blood sugar goes below 70, or if you have a lot of readings over 200.   I have sent a prescription to your pharmacy, to add "Novolog." Please come back for a follow-up appointment in 2 months.

## 2017-11-07 MED FILL — GLIMEPIRIDE 4 MG TABLET: 4 | 30 days supply | Qty: 30 | Fill #3

## 2018-01-30 ENCOUNTER — Ambulatory Visit (INDEPENDENT_AMBULATORY_CARE_PROVIDER_SITE_OTHER): Payer: Federal, State, Local not specified - PPO | Admitting: Endocrinology

## 2018-01-30 ENCOUNTER — Encounter: Payer: Self-pay | Admitting: Endocrinology

## 2018-01-30 VITALS — BP 138/86 | HR 78 | Resp 18 | Wt 210.6 lb

## 2018-01-30 DIAGNOSIS — E118 Type 2 diabetes mellitus with unspecified complications: Secondary | ICD-10-CM

## 2018-01-30 LAB — POCT GLYCOSYLATED HEMOGLOBIN (HGB A1C): Hemoglobin A1C: 9.7

## 2018-01-30 MED ORDER — INSULIN ASPART 100 UNIT/ML FLEXPEN: 12.0000 [IU] | PEN_INJECTOR | Freq: Three times a day (TID) | SUBCUTANEOUS | 11 refills | Status: DC

## 2018-01-30 NOTE — Progress Notes (Signed)
Subjective:    Patient ID: Tim Wilson, male    DOB: 02/17/1966, 52 y.o.   MRN: [id number]  HPI Pt returns for f/u of diabetes mellitus: DM type: Insulin-requiring type 2.   Dx'ed: 3524 Complications: none Therapy: insulin since 2019, and 2 oral meds.   DKA: never Severe hypoglycemia: never.  Pancreatitis: never.   Other: he took insulin in 2012, and 2014-2015; he did not tolerate metformin-XR or invokana (diarrhea with both); he stopped pioglitizone due to fatigue and DOE; he works 1st shift; he takes multiple daily injections.   Interval hx: no cbg record, but states cbg's vary from 100-130 fasting, which is the only time of day he checks. He stopped [place], but still takes the other 2 oral meds.  Past Medical History:  Diagnosis Date  . Diabetes mellitus   . Hyperlipidemia   . Hypertension     No past surgical history on file.  Social History   Socioeconomic History  . Marital status: Married    Spouse name: Not on file  . Number of children: Not on file  . Years of education: Not on file  . Highest education level: Not on file  Occupational History  . 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 Financial resource strain: Not on file  . Food insecurity:    Worry: Not on file    Inability: Not on file  . Transportation needs:    Medical: Not on file    Non-medical: Not on file  Tobacco Use  . Smoking status: Never Smoker  . Smokeless tobacco: Never Used  Substance and Sexual Activity  . Alcohol use: Yes    Alcohol/week: 1.2 oz    Types: 2 Cans of beer per week    Comment: on occasion, not in excess  . Drug use: No  . Sexual activity: Yes  Lifestyle  . Physical activity:    Days per week: Not on file    Minutes per session: Not on file  . Stress: Not on file  Relationships  . Social connections:    Talks on phone: Not on file    Gets together: Not on  file    Attends religious service: Not on file    Active member of club or organization: Not on file    Attends meetings of clubs or organizations: Not on file    Relationship status: Not on file  . Intimate partner violence:    Fear of current or ex partner: Not on file    Emotionally abused: Not on file    Physically abused: Not on file    Forced sexual activity: Not on file  Other Topics Concern  . Not on file  Social History Narrative   Regular exercise-yes    Current Outpatient Medications on File Prior to Visit  Medication Sig Dispense Refill  . aspirin 325 MG EC tablet Take 325 mg by mouth daily.      . Blood Glucose Monitoring Suppl (ONE TOUCH ULTRA 2) W/DEVICE KIT Use two times a day to test blood sugar     . dapagliflozin propanediol (FARXIGA) 10 MG TABS tablet Take 10 mg by mouth daily. 90 tablet 3  . fenofibrate (TRICOR) 145 MG tablet Take 1 tablet (145 mg total) by mouth daily. 90 tablet 2  . glimepiride (AMARYL) 4 MG tablet Take 1 tablet (4 mg total) by mouth daily  before breakfast. 30 tablet 11  . glucose blood (ONETOUCH VERIO) test strip 1 each by Other route 2 (two) times daily. And lancets 2/day 250.01 100 each 12  . olmesartan (BENICAR) 20 MG tablet Take 1 tablet (20 mg total) by mouth daily. 90 tablet 3  . rosuvastatin (CRESTOR) 20 MG tablet Take 1 tablet (20 mg total) by mouth daily. 90 tablet 2  . [DISCONTINUED] colchicine 0.6 MG tablet Take 1 tablet (0.6 mg total) by mouth 2 (two) times daily. 60 tablet 2   Current Facility-Administered Medications on File Prior to Visit  Medication Dose Route Frequency Provider Last Rate Last Dose  . 0.9 %  sodium chloride infusion  500 mL Intravenous Continuous Doran Stabler, MD        Allergies  Allergen Reactions  . Ramipril     cough    Family History  Problem Relation Age of Onset  . Heart disease Father   . Heart disease Brother   . Diabetes Mother   . Breast cancer Cousin   . Colon cancer Neg Hx   .  Stomach cancer Neg Hx   . Esophageal cancer Neg Hx     BP 138/86   Pulse 78   Resp 18   Wt 210 lb 9.6 oz (95.5 kg)   SpO2 96%   BMI 32.02 kg/m    Review of Systems he denies hypoglycemia.      Objective:   Physical Exam VITAL SIGNS:  See vs page GENERAL: no distress Pulses: dorsalis pedis intact bilat.   MSK: no deformity of the feet CV: no leg edema Skin:  no ulcer on the feet.  normal color and temp on the feet. Neuro: sensation is intact to touch on the feet.     Lab Results  Component Value Date   HGBA1C 9.7 01/30/2018      Assessment & Plan:  Insulin-requiring type 2 DM: worse. Noncompliance with cbg recording: we discussed need to check at different times of day.   Patient Instructions  check your blood sugar twice a day.  vary the time of day when you check, between before the 3 meals, and at bedtime.  also check if you have symptoms of your blood sugar being too high or too low.  please keep a record of the readings and bring it to your next appointment here.  You can write it on any piece of paper.  please call us sooner if your blood sugar goes below 70, or if you have a lot of readings over 200.   I have sent a prescription to your pharmacy, to increase the Novolog to 12 units 3 times a day (just before each meal).  Please continue the same other diabetes medications Please come back for a follow-up appointment in 2 months.

## 2018-01-30 NOTE — Patient Instructions (Addendum)
check your blood sugar twice a day.  vary the time of day when you check, between before the 3 meals, and at bedtime.  also check if you have symptoms of your blood sugar being too high or too low.  please keep a record of the readings and bring it to your next appointment here.  You can write it on any piece of paper.  please call us sooner if your blood sugar goes below 70, or if you have a lot of readings over 200.   I have sent a prescription to your pharmacy, to increase the Novolog to 12 units 3 times a day (just before each meal).  Please continue the same other diabetes medications Please come back for a follow-up appointment in 2 months.

## 2018-04-04 ENCOUNTER — Other Ambulatory Visit: Payer: Federal, State, Local not specified - PPO

## 2018-04-04 ENCOUNTER — Ambulatory Visit (INDEPENDENT_AMBULATORY_CARE_PROVIDER_SITE_OTHER): Payer: Federal, State, Local not specified - PPO | Admitting: Internal Medicine

## 2018-04-04 ENCOUNTER — Other Ambulatory Visit (INDEPENDENT_AMBULATORY_CARE_PROVIDER_SITE_OTHER): Payer: Federal, State, Local not specified - PPO

## 2018-04-04 ENCOUNTER — Encounter: Payer: Self-pay | Admitting: Internal Medicine

## 2018-04-04 VITALS — BP 110/66 | HR 92 | Temp 98.6°F | Ht 68.0 in | Wt 197.2 lb

## 2018-04-04 DIAGNOSIS — Z0000 Encounter for general adult medical examination without abnormal findings: Secondary | ICD-10-CM

## 2018-04-04 DIAGNOSIS — E118 Type 2 diabetes mellitus with unspecified complications: Secondary | ICD-10-CM

## 2018-04-04 DIAGNOSIS — E781 Pure hyperglyceridemia: Secondary | ICD-10-CM | POA: Diagnosis not present

## 2018-04-04 DIAGNOSIS — K5900 Constipation, unspecified: Secondary | ICD-10-CM

## 2018-04-04 DIAGNOSIS — E785 Hyperlipidemia, unspecified: Secondary | ICD-10-CM | POA: Diagnosis not present

## 2018-04-04 DIAGNOSIS — R800 Isolated proteinuria: Secondary | ICD-10-CM

## 2018-04-04 DIAGNOSIS — I10 Essential (primary) hypertension: Secondary | ICD-10-CM | POA: Diagnosis not present

## 2018-04-04 DIAGNOSIS — R10813 Right lower quadrant abdominal tenderness: Secondary | ICD-10-CM

## 2018-04-04 DIAGNOSIS — Z794 Long term (current) use of insulin: Secondary | ICD-10-CM

## 2018-04-04 DIAGNOSIS — D539 Nutritional anemia, unspecified: Secondary | ICD-10-CM | POA: Diagnosis not present

## 2018-04-04 DIAGNOSIS — D729 Disorder of white blood cells, unspecified: Secondary | ICD-10-CM

## 2018-04-04 LAB — COMPREHENSIVE METABOLIC PANEL
ALT: 15 U/L (ref 0–53)
AST: 17 U/L (ref 0–37)
Albumin: 3.5 g/dL (ref 3.5–5.2)
Alkaline Phosphatase: 65 U/L (ref 39–117)
BILIRUBIN TOTAL: 0.6 mg/dL (ref 0.2–1.2)
BUN: 15 mg/dL (ref 6–23)
CO2: 30 mEq/L (ref 19–32)
CREATININE: 0.94 mg/dL (ref 0.40–1.50)
Calcium: 8.8 mg/dL (ref 8.4–10.5)
Chloride: 86 mEq/L — ABNORMAL LOW (ref 96–112)
GFR: 108.26 mL/min (ref >60.00)
Glucose, Bld: 428 mg/dL — ABNORMAL HIGH (ref 70–99)
Potassium: 3.5 mEq/L (ref 3.5–5.1)
SODIUM: 128 meq/L — AB (ref 135–145)
Total Protein: 7.4 g/dL (ref 6.0–8.3)

## 2018-04-04 LAB — CBC WITH DIFFERENTIAL/PLATELET
Basophils Absolute: 0.1 10*3/uL (ref 0.0–0.1)
Basophils Relative: 1 % (ref 0.0–3.0)
EOS PCT: 0.7 % (ref 0.0–5.0)
Eosinophils Absolute: 0.1 10*3/uL (ref 0.0–0.7)
HEMATOCRIT: 37.3 % — AB (ref 39.0–52.0)
HEMOGLOBIN: 12.6 g/dL — AB (ref 13.0–17.0)
LYMPHS ABS: 2.4 10*3/uL (ref 0.7–4.0)
LYMPHS PCT: 18.2 % (ref 12.0–46.0)
MCHC: 33.7 g/dL (ref 30.0–36.0)
MCV: 81.7 fl (ref 78.0–100.0)
MONOS PCT: 9.4 % (ref 3.0–12.0)
Monocytes Absolute: 1.2 10*3/uL — ABNORMAL HIGH (ref 0.1–1.0)
NEUTROS PCT: 70.7 % (ref 43.0–77.0)
Neutro Abs: 9.2 10*3/uL — ABNORMAL HIGH (ref 1.4–7.7)
Platelets: 288 10*3/uL (ref 150.0–400.0)
RBC: 4.57 Mil/uL (ref 4.22–5.81)
RDW: 14.3 % (ref 11.5–15.5)
WBC: 13 10*3/uL — AB (ref 4.0–10.5)

## 2018-04-04 LAB — URINALYSIS, ROUTINE W REFLEX MICROSCOPIC
BILIRUBIN URINE: NEGATIVE
KETONES UR: NEGATIVE
LEUKOCYTES UA: NEGATIVE
Nitrite: NEGATIVE
Specific Gravity, Urine: 1.01 (ref 1.000–1.030)
Total Protein, Urine: 30 — AB
Urobilinogen, UA: 1 (ref 0.0–1.0)
pH: 5.5 (ref 5.0–8.0)

## 2018-04-04 LAB — VITAMIN B12: Vitamin B-12: 225 pg/mL (ref 211–911)

## 2018-04-04 LAB — LDL CHOLESTEROL, DIRECT: Direct LDL: 148 mg/dL

## 2018-04-04 LAB — MICROALBUMIN / CREATININE URINE RATIO
CREATININE, U: 102.1 mg/dL
Microalb Creat Ratio: 21.2 mg/g (ref 0.0–30.0)
Microalb, Ur: 21.7 mg/dL — ABNORMAL HIGH (ref 0.0–1.9)

## 2018-04-04 LAB — FOLATE: FOLATE: 18.6 ng/mL (ref >5.9)

## 2018-04-04 LAB — PSA: PSA: 0.49 ng/mL (ref 0.10–4.00)

## 2018-04-04 LAB — LIPID PANEL
CHOL/HDL RATIO: 33
Cholesterol: 527 mg/dL — ABNORMAL HIGH (ref 0–200)
HDL: 15.8 mg/dL — AB (ref >39.00)
Triglycerides: 1240 mg/dL — ABNORMAL HIGH (ref 0.0–149.0)

## 2018-04-04 LAB — LIPASE: LIPASE: 41 U/L (ref 11.0–59.0)

## 2018-04-04 LAB — HEMOGLOBIN A1C: Hgb A1c MFr Bld: 12.6 % — ABNORMAL HIGH (ref 4.6–6.5)

## 2018-04-04 LAB — THYROID PANEL WITH TSH
Free Thyroxine Index: 2.7 (ref 1.4–3.8)
T3 UPTAKE: 35 % (ref 22–35)
T4 TOTAL: 7.7 ug/dL (ref 4.9–10.5)
TSH: 1.35 mIU/L (ref 0.40–4.50)

## 2018-04-04 LAB — HM DIABETES FOOT EXAM

## 2018-04-04 MED ORDER — ICOSAPENT ETHYL 1 G PO CAPS: 2.0000 | ORAL_CAPSULE | Freq: Two times a day (BID) | ORAL | 1 refills | Status: DC

## 2018-04-04 NOTE — Patient Instructions (Signed)

## 2018-04-04 NOTE — Progress Notes (Signed)
Subjective:  Patient ID: Tim Wilson, male    DOB: 1966-05-04  Age: 52 y.o. MRN: [id number]  CC: Hypertension; Hyperlipidemia; Diabetes; Annual Exam; Anemia; and Abdominal Pain   HPI Tim Wilson presents for a CPX.  He complains of a 5-day history of right lower quadrant abdominal pain and constipation.  He tells me a few days ago he was seen at a hospital in [place] where plain x-rays were remarkable for stool retention.  He used an enema and MiraLAX and had some bowel movements but his symptoms have not resolved.  He has hadd a few chills but he denies fever, nausea, vomiting, loss of appetite, diarrhea, or bloody stool.  He is not compliant with his treatment of hyperlipidemia, diabetes, and hypertension.  Outpatient Medications Prior to Visit  Medication Sig Dispense Refill  . aspirin 325 MG EC tablet Take 325 mg by mouth daily.      . Blood Glucose Monitoring Suppl (ONE TOUCH ULTRA 2) W/DEVICE KIT Use two times a day to test blood sugar     . dapagliflozin propanediol (FARXIGA) 10 MG TABS tablet Take 10 mg by mouth daily. 90 tablet 3  . glucose blood (ONETOUCH VERIO) test strip 1 each by Other route 2 (two) times daily. And lancets 2/day 250.01 100 each 12  . insulin aspart (NOVOLOG FLEXPEN) 100 UNIT/ML FlexPen Inject 12 Units into the skin 3 (three) times daily with meals. And pen needles 3/day 15 mL 11  . olmesartan (BENICAR) 20 MG tablet Take 1 tablet (20 mg total) by mouth daily. 90 tablet 3  . rosuvastatin (CRESTOR) 20 MG tablet Take 1 tablet (20 mg total) by mouth daily. 90 tablet 2  . fenofibrate (TRICOR) 145 MG tablet Take 1 tablet (145 mg total) by mouth daily. 90 tablet 2  . glimepiride (AMARYL) 4 MG tablet Take 1 tablet (4 mg total) by mouth daily before breakfast. 30 tablet 11  . 0.9 %  sodium chloride infusion      No facility-administered medications prior to visit.     ROS Review of Systems  Constitutional: Positive for chills. Negative for appetite change,  diaphoresis, fatigue and unexpected weight change.  HENT: Negative.   Eyes: Negative for visual disturbance.  Respiratory: Negative.  Negative for cough, chest tightness, shortness of breath and wheezing.   Cardiovascular: Negative for chest pain, palpitations and leg swelling.  Gastrointestinal: Positive for abdominal pain and constipation. Negative for abdominal distention, blood in stool, diarrhea and nausea.  Endocrine: Positive for polydipsia, polyphagia and polyuria.  Genitourinary: Negative.  Negative for difficulty urinating, discharge, dysuria, hematuria, penile pain, penile swelling, scrotal swelling, testicular pain and urgency.  Musculoskeletal: Negative.  Negative for arthralgias, back pain, myalgias and neck pain.  Skin: Negative.  Negative for color change, pallor and rash.  Allergic/Immunologic: Negative.   Neurological: Negative.  Negative for dizziness, weakness, light-headedness and headaches.  Hematological: Negative for adenopathy. Does not bruise/bleed easily.  Psychiatric/Behavioral: Negative.     Objective:  BP 110/66 (BP Location: Left Arm, Patient Position: Sitting, Cuff Size: Normal)   Pulse 92   Temp 98.6 F (37 C) (Oral)   Ht '5\' 8"'  (1.727 m)   Wt 197 lb 4 oz (89.5 kg)   SpO2 98%   BMI 29.99 kg/m   BP Readings from Last 3 Encounters:  04/04/18 110/66  01/30/18 138/86  11/01/17 136/90    Wt Readings from Last 3 Encounters:  04/04/18 197 lb 4 oz (89.5 kg)  01/30/18 210 lb  9.6 oz (95.5 kg)  11/01/17 210 lb 3.2 oz (95.3 kg)    Physical Exam  Constitutional: He is oriented to person, place, and time.  Non-toxic appearance. He does not appear ill.  HENT:  Mouth/Throat: Oropharynx is clear and moist. No oropharyngeal exudate.  Eyes: No scleral icterus.  Cardiovascular: Normal rate and regular rhythm. Exam reveals no gallop.  No murmur heard. Pulmonary/Chest: Effort normal and breath sounds normal. He has no wheezes. He has no rhonchi. He has no  rales. He exhibits no tenderness.  Abdominal: Soft. Normal appearance and bowel sounds are normal. There is no hepatosplenomegaly. There is tenderness. There is rebound. There is no rigidity, no guarding, no CVA tenderness, no tenderness at McBurney's point and negative Murphy's sign. No hernia. Hernia confirmed negative in the right inguinal area.  Genitourinary: Testes normal and penis normal. Rectal exam shows no external hemorrhoid, no internal hemorrhoid, no fissure, no mass, no tenderness, anal tone normal and guaiac negative stool. Prostate is enlarged (1+ BPH). Prostate is not tender. Cremasteric reflex is present. Right testis shows no mass, no swelling and no tenderness. Right testis is descended. Left testis shows no mass, no swelling and no tenderness. Left testis is descended. Circumcised. No penile erythema or penile tenderness. No discharge found.  Musculoskeletal: Normal range of motion. He exhibits no edema, tenderness or deformity.  Lymphadenopathy: No inguinal adenopathy noted on the right or left side.  Neurological: He is alert and oriented to person, place, and time.  Skin: Skin is warm and dry. No rash noted.  Vitals reviewed.   Lab Results  Component Value Date   WBC 13.0 (H) 04/04/2018   HGB 12.6 (L) 04/04/2018   HCT 37.3 (L) 04/04/2018   PLT 288.0 04/04/2018   GLUCOSE 428 (H) 04/04/2018   CHOL 527 (H) 04/04/2018   TRIG (H) 04/04/2018    1240.0 Triglyceride is over 400; calculations on Lipids are invalid.   HDL 15.80 (L) 04/04/2018   LDLDIRECT 148.0 04/04/2018   LDLCALC NOT CALC 09/21/2016   ALT 15 04/04/2018   AST 17 04/04/2018   NA 128 (L) 04/04/2018   K 3.5 04/04/2018   CL 86 (L) 04/04/2018   CREATININE 0.94 04/04/2018   BUN 15 04/04/2018   CO2 30 04/04/2018   TSH 1.10 09/21/2016   PSA 0.49 04/04/2018   HGBA1C 12.6 (H) 04/04/2018   MICROALBUR 21.7 (H) 04/04/2018    No results found.  Assessment & Plan:   Eros was seen today for hypertension,  hyperlipidemia, diabetes, annual exam, anemia and abdominal pain.  Diagnoses and all orders for this visit:  Essential hypertension- His blood pressure is low.  This may be related to an acute abdominal process. -     Cancel: Comprehensive metabolic panel; Future -     Thyroid Panel With TSH; Future -     Comprehensive metabolic panel; Future  Type 2 diabetes mellitus with complication, with long-term current use of insulin ([place])- His blood sugars are not adequately well controlled.  This is mostly due to noncompliance.  It appears that he has an acute abdominal process so I anticipate his hyperglycemia will be treated as an inpatient. -     Cancel: Comprehensive metabolic panel; Future -     Hemoglobin A1c; Future -     Microalbumin / creatinine urine ratio; Future -     Ambulatory referral to Ophthalmology -     Comprehensive metabolic panel; Future -     Amb Referral to Nutrition and  Diabetic E -     Ambulatory referral to Endocrinology  Isolated proteinuria without specific morphologic lesion -     Urinalysis, Routine w reflex microscopic; Future  HYPERTRIGLYCERIDEMIA- His trigs are above 1000.  I have asked him to start taking an omega-3 fish oil for this. -     Amb Referral to Nutrition and Diabetic E -     Icosapent Ethyl (VASCEPA) 1 g CAPS; Take 2 capsules (2 g total) by mouth 2 (two) times daily.  Hyperlipidemia with target LDL less than 100- He has not achieved his LDL goal due to noncompliance.  Routine general medical examination at a health care facility- Exam completed, labs reviewed, vaccines reviewed and updated, screening for colon cancer is up-to-date, patient education material was given. -     Lipid panel; Future -     PSA; Future  Deficiency anemia- I will screen him for vitamin deficiencies. -     CBC with Differential/Platelet; Future -     Vitamin B12; Future -     Folate; Future  Constipation, unspecified constipation type -     Cancel: Comprehensive  metabolic panel; Future -     Thyroid Panel With TSH; Future  Right lower quadrant abdominal tenderness, rebound tenderness presence not specified- He has an acute abdominal process with an elevated white cell count.  I have asked him to undergo a stat CT of the abdomen and pelvis today with contrast. -     Lipase; Future -     CT Abdomen Pelvis W Contrast; Future   I have discontinued Antionette Fairy. Palinkas's fenofibrate and glimepiride. I am also having him start on Icosapent Ethyl. Additionally, I am having him maintain his aspirin, ONE TOUCH ULTRA 2, glucose blood, olmesartan, rosuvastatin, dapagliflozin propanediol, and insulin aspart. We will stop administering sodium chloride.  Meds ordered this encounter  Medications  . Icosapent Ethyl (VASCEPA) 1 g CAPS    Sig: Take 2 capsules (2 g total) by mouth 2 (two) times daily.    Dispense:  360 capsule    Refill:  1     Follow-up: Return in about 1 day (around 04/05/2018).  Scarlette Calico, MD

## 2018-04-05 ENCOUNTER — Emergency Department (HOSPITAL_COMMUNITY)
Admission: EM | Admit: 2018-04-05 | Discharge: 2018-04-05 | Disposition: A | Discharge status: Home / Self Care | Payer: Federal, State, Local not specified - PPO | Attending: Emergency Medicine | Admitting: Emergency Medicine

## 2018-04-05 ENCOUNTER — Other Ambulatory Visit: Payer: Self-pay | Admitting: Internal Medicine

## 2018-04-05 ENCOUNTER — Encounter (HOSPITAL_COMMUNITY): Payer: Self-pay

## 2018-04-05 ENCOUNTER — Ambulatory Visit
Admission: RE | Admit: 2018-04-05 | Discharge: 2018-04-05 | Disposition: A | Discharge status: Home / Self Care | Payer: Federal, State, Local not specified - PPO | Source: Ambulatory Visit | Attending: Internal Medicine | Admitting: Internal Medicine

## 2018-04-05 ENCOUNTER — Other Ambulatory Visit: Payer: Self-pay

## 2018-04-05 DIAGNOSIS — E781 Pure hyperglyceridemia: Secondary | ICD-10-CM | POA: Insufficient documentation

## 2018-04-05 DIAGNOSIS — K858 Other acute pancreatitis without necrosis or infection: Secondary | ICD-10-CM

## 2018-04-05 DIAGNOSIS — Z794 Long term (current) use of insulin: Principal | ICD-10-CM

## 2018-04-05 DIAGNOSIS — E118 Type 2 diabetes mellitus with unspecified complications: Secondary | ICD-10-CM

## 2018-04-05 DIAGNOSIS — E785 Hyperlipidemia, unspecified: Secondary | ICD-10-CM

## 2018-04-05 DIAGNOSIS — K859 Acute pancreatitis without necrosis or infection, unspecified: Secondary | ICD-10-CM | POA: Insufficient documentation

## 2018-04-05 DIAGNOSIS — R1031 Right lower quadrant pain: Secondary | ICD-10-CM | POA: Diagnosis not present

## 2018-04-05 DIAGNOSIS — R10813 Right lower quadrant abdominal tenderness: Secondary | ICD-10-CM

## 2018-04-05 DIAGNOSIS — R109 Unspecified abdominal pain: Secondary | ICD-10-CM | POA: Diagnosis not present

## 2018-04-05 DIAGNOSIS — K5900 Constipation, unspecified: Secondary | ICD-10-CM | POA: Diagnosis not present

## 2018-04-05 LAB — URINALYSIS, ROUTINE W REFLEX MICROSCOPIC
BACTERIA UA: NONE SEEN
Bilirubin Urine: NEGATIVE
Glucose, UA: >=500 mg/dL — AB
Ketones, ur: 5 mg/dL — AB
Leukocytes, UA: NEGATIVE
Nitrite: NEGATIVE
Protein, ur: NEGATIVE mg/dL
pH: 6 (ref 5.0–8.0)

## 2018-04-05 LAB — CBC WITH DIFFERENTIAL/PLATELET
ABS IMMATURE GRANULOCYTES: 0.3 10*3/uL — AB (ref 0.0–0.1)
BASOS PCT: 1 %
Basophils Absolute: 0.1 10*3/uL (ref 0.0–0.1)
Eosinophils Absolute: 0.1 10*3/uL (ref 0.0–0.7)
Eosinophils Relative: 1 %
HCT: 39 % (ref 39.0–52.0)
HEMOGLOBIN: 12.5 g/dL — AB (ref 13.0–17.0)
IMMATURE GRANULOCYTES: 2 %
LYMPHS ABS: 3.2 10*3/uL (ref 0.7–4.0)
LYMPHS PCT: 24 %
MCH: 26.6 pg (ref 26.0–34.0)
MCHC: 32.1 g/dL (ref 30.0–36.0)
MCV: 83 fL (ref 78.0–100.0)
MONOS PCT: 9 %
Monocytes Absolute: 1.2 10*3/uL — ABNORMAL HIGH (ref 0.1–1.0)
NEUTROS ABS: 8.6 10*3/uL — AB (ref 1.7–7.7)
NEUTROS PCT: 63 %
Platelets: 289 10*3/uL (ref 150–400)
RBC: 4.7 MIL/uL (ref 4.22–5.81)
RDW: 14.3 % (ref 11.5–15.5)
WBC: 13.5 10*3/uL — ABNORMAL HIGH (ref 4.0–10.5)

## 2018-04-05 LAB — COMPREHENSIVE METABOLIC PANEL
ALBUMIN: 2.8 g/dL — AB (ref 3.5–5.0)
ALT: 20 U/L (ref 0–44)
AST: 27 U/L (ref 15–41)
Alkaline Phosphatase: 57 U/L (ref 38–126)
Anion gap: 14 (ref 5–15)
BILIRUBIN TOTAL: 1.1 mg/dL (ref 0.3–1.2)
BUN: 12 mg/dL (ref 6–20)
CALCIUM: 9.3 mg/dL (ref 8.9–10.3)
CO2: 27 mmol/L (ref 22–32)
CREATININE: 0.97 mg/dL (ref 0.61–1.24)
Chloride: 90 mmol/L — ABNORMAL LOW (ref 98–111)
GFR calc Af Amer: >60 mL/min (ref >60)
GFR calc non Af Amer: >60 mL/min (ref >60)
GLUCOSE: 372 mg/dL — AB (ref 70–99)
Potassium: 3.9 mmol/L (ref 3.5–5.1)
SODIUM: 131 mmol/L — AB (ref 135–145)
TOTAL PROTEIN: 8.4 g/dL — AB (ref 6.5–8.1)

## 2018-04-05 LAB — PATHOLOGIST SMEAR REVIEW

## 2018-04-05 LAB — LIPASE, BLOOD: Lipase: 34 U/L (ref 11–51)

## 2018-04-05 MED ORDER — DOCUSATE SODIUM 100 MG PO CAPS: 100.0000 mg | ORAL_CAPSULE | Freq: Two times a day (BID) | ORAL | 0 refills | Status: DC

## 2018-04-05 MED ORDER — INSULIN GLARGINE 300 UNIT/ML ~~LOC~~ SOPN: 50.0000 [IU] | PEN_INJECTOR | Freq: Every day | SUBCUTANEOUS | 1 refills | Status: DC

## 2018-04-05 MED ORDER — INSULIN PEN NEEDLE 32G X 6 MM MISC: 1.0000 | Freq: Four times a day (QID) | 3 refills | Status: DC

## 2018-04-05 MED ORDER — MORPHINE SULFATE (PF) 4 MG/ML IV SOLN
4.0000 mg | Freq: Once | INTRAVENOUS | Status: AC
  Administered 2018-04-05: 4 mg via INTRAVENOUS
  Filled 2018-04-05: qty 1

## 2018-04-05 MED ORDER — IOPAMIDOL (ISOVUE-300) INJECTION 61%
100.0000 mL | Freq: Once | INTRAVENOUS | Status: AC | PRN
  Administered 2018-04-05: 100 mL via INTRAVENOUS

## 2018-04-05 MED ORDER — HYDROCODONE-ACETAMINOPHEN 5-325 MG PO TABS: 1.0000 | ORAL_TABLET | ORAL | 0 refills | Status: DC | PRN

## 2018-04-05 MED ORDER — INSULIN ASPART 100 UNIT/ML FLEXPEN: 12.0000 [IU] | PEN_INJECTOR | Freq: Three times a day (TID) | SUBCUTANEOUS | 3 refills | Status: DC

## 2018-04-05 MED ORDER — SODIUM CHLORIDE 0.9 % IV BOLUS
1000.0000 mL | Freq: Once | INTRAVENOUS | Status: AC
  Administered 2018-04-05: 1000 mL via INTRAVENOUS

## 2018-04-05 MED FILL — FENOFIBRATE 145 MG TAB: 145 | 90 days supply | Qty: 90 | Fill #0

## 2018-04-05 MED FILL — ROSUVASTATIN CALCIUM 20 MG: 20 | 90 days supply | Qty: 90 | Fill #0

## 2018-04-05 MED FILL — OLMESARTAN MEDOXOMIL 20 MG: 20 | 90 days supply | Qty: 90 | Fill #1

## 2018-04-05 NOTE — ED Notes (Signed)
Urinal given - pt aware of need for specimen

## 2018-04-05 NOTE — ED Triage Notes (Signed)
Patient complains of 1 week of RLQ pain with constipation. Had CT today that showed per patient inflamed pancreas, reports chills and fever for same. No nauea, no vomiting

## 2018-04-05 NOTE — ED Provider Notes (Signed)
[facility] EMERGENCY DEPARTMENT Provider Note   CSN: [id number] Arrival date & time: 04/05/18  1011     History   Chief Complaint Chief Complaint  Patient presents with  . RLQ pain x 1 week    HPI Tim Wilson is a 52 y.o. male who presents with abdominal pain.  Past medical history significant for insulin-dependent diabetes, hypertension, hyperlipidemia.  Patient states that he started having abdominal pain last week in the right lower quadrant after he ate some beef hotdogs which she thinks may have been bad.  He traveled out of state and ended up going to [place] and had an x-ray of his abdomen which showed that he may be constipated and he had an enema was put on stool softeners.  He did have multiple bowel movements but states that the pain never really got better.  He states the pain fluctuates between a 7 out of 10.  He gets better with ibuprofen and drinking cold liquids.  He cannot identify any aggravating factors.  He had a follow-up visit with his doctor yesterday who ordered blood work and CT scan of his abdomen.  The CT showed severe pancreatitis with extensive surrounding edema tracking into the right pericolic gutter.  There are no gallstones.  He denies significant alcohol use.  He he denies any prior abdominal surgeries.  He has never had pancreatitis in the past.  He denies history of GERD. He has had subjective fever and chills. No N/V. He has been constipated. No urinary symptoms.  HPI  Past Medical History:  Diagnosis Date  . Diabetes mellitus   . Hyperlipidemia   . Hypertension     Patient Active Problem List   Diagnosis Date Noted  . Constipation 04/04/2018  . Right lower quadrant abdominal tenderness 04/04/2018  . Deficiency anemia 01/20/2017  . Screen for colon cancer 01/20/2017  . DOE (dyspnea on exertion) 01/20/2017  . Snoring 09/21/2016  . Routine general medical examination at a health care facility 07/13/2012  .  Hyperlipidemia with target LDL less than 100 08/06/2010  . MICROALBUMINURIA 07/27/2010  . Type II diabetes mellitus with manifestations ([place]) 07/10/2010  . HYPERTRIGLYCERIDEMIA 10/16/2009  . Essential hypertension 10/16/2009    History reviewed. No pertinent surgical history.      Home Medications    Prior to Admission medications   Medication Sig Start Date End Date Taking? Authorizing Provider  aspirin 325 MG EC tablet Take 325 mg by mouth daily.      [provider]  Blood Glucose Monitoring Suppl (ONE TOUCH ULTRA 2) W/DEVICE KIT Use two times a day to test blood sugar     [provider]  dapagliflozin propanediol (FARXIGA) 10 MG TABS tablet Take 10 mg by mouth daily. 07/26/17   Renato Shin, MD  glucose blood [facility] VERIO) test strip 1 each by Other route 2 (two) times daily. And lancets 2/day 250.01 08/06/13   Renato Shin, MD  Icosapent Ethyl (VASCEPA) 1 g CAPS Take 2 capsules (2 g total) by mouth 2 (two) times daily. 04/04/18   Janith Lima, MD  insulin aspart (NOVOLOG FLEXPEN) 100 UNIT/ML FlexPen Inject 12 Units into the skin 3 (three) times daily with meals. And pen needles 3/day 01/30/18   Renato Shin, MD  olmesartan (BENICAR) 20 MG tablet Take 1 tablet (20 mg total) by mouth daily. 09/21/16   Janith Lima, MD  rosuvastatin (CRESTOR) 20 MG tablet Take 1 tablet (20 mg total) by mouth daily.  09/21/16   Janith Lima, MD  colchicine 0.6 MG tablet Take 1 tablet (0.6 mg total) by mouth 2 (two) times daily. 08/20/13 08/25/13  Janith Lima, MD    Family History Family History  Problem Relation Age of Onset  . Heart disease Father   . Heart disease Brother   . Diabetes Mother   . Breast cancer Cousin   . Colon cancer Neg Hx   . Stomach cancer Neg Hx   . Esophageal cancer Neg Hx     Social History Social History   Tobacco Use  . Smoking status: Never Smoker  . Smokeless tobacco: Never Used  Substance Use Topics  . Alcohol use: Yes     Alcohol/week: 1.2 oz    Types: 2 Cans of beer per week    Comment: on occasion, not in excess  . Drug use: No     Allergies   Ramipril   Review of Systems Review of Systems  Constitutional: Positive for chills and fever (subjective).  Respiratory: Negative for shortness of breath.   Cardiovascular: Negative for chest pain.  Gastrointestinal: Positive for abdominal pain and constipation. Negative for diarrhea, nausea and vomiting.  Genitourinary: Negative for dysuria and flank pain.  All other systems reviewed and are negative.    Physical Exam Updated Vital Signs BP 125/87 (BP Location: Right Arm)   Pulse 87   Temp 98.5 F (36.9 C) (Oral)   Resp 20   SpO2 99%   Physical Exam  Constitutional: He is oriented to person, place, and time. He appears well-developed and well-nourished. No distress.  HENT:  Head: Normocephalic and atraumatic.  Eyes: Pupils are equal, round, and reactive to light. Conjunctivae are normal. Right eye exhibits no discharge. Left eye exhibits no discharge. No scleral icterus.  Neck: Normal range of motion.  Cardiovascular: Normal rate and regular rhythm.  Pulmonary/Chest: Effort normal and breath sounds normal. No respiratory distress.  Abdominal: Soft. Bowel sounds are normal. He exhibits no distension. There is tenderness (LUQ, RUQ, RLQ tenderness).  Neurological: He is alert and oriented to person, place, and time.  Skin: Skin is warm and dry.  Psychiatric: He has a normal mood and affect. His behavior is normal.  Nursing note and vitals reviewed.    ED Treatments / Results  Labs (all labs ordered are listed, but only abnormal results are displayed) Labs Reviewed  COMPREHENSIVE METABOLIC PANEL - Abnormal; Notable for the following components:      Result Value   Sodium 131 (*)    Chloride 90 (*)    Glucose, Bld 372 (*)    Total Protein 8.4 (*)    Albumin 2.8 (*)    All other components within normal limits  URINALYSIS, ROUTINE W  REFLEX MICROSCOPIC - Abnormal; Notable for the following components:   Specific Gravity, Urine >1.046 (*)    Glucose, UA >=500 (*)    Hgb urine dipstick MODERATE (*)    Ketones, ur 5 (*)    All other components within normal limits  CBC WITH DIFFERENTIAL/PLATELET - Abnormal; Notable for the following components:   WBC 13.5 (*)    Hemoglobin 12.5 (*)    Neutro Abs 8.6 (*)    Monocytes Absolute 1.2 (*)    Abs Immature Granulocytes 0.3 (*)    All other components within normal limits  LIPASE, BLOOD    EKG None  Radiology Ct Abdomen Pelvis W Contrast  Result Date: 04/05/2018 CLINICAL DATA:  Acute right lower quadrant abdominal pain.  EXAM: CT ABDOMEN AND PELVIS WITH CONTRAST TECHNIQUE: Multidetector CT imaging of the abdomen and pelvis was performed using the standard protocol following bolus administration of intravenous contrast. CONTRAST:  122m ISOVUE-300 IOPAMIDOL (ISOVUE-300) INJECTION 61% COMPARISON:  None. FINDINGS: Lower chest: No acute abnormality. Hepatobiliary: No focal liver abnormality is seen. No gallstones, gallbladder wall thickening, or biliary dilatation. Pancreas: Large amount of fluid is noted around the pancreatic head and duodenum most consistent with pancreatitis. No ductal dilatation is noted. Spleen: Normal in size without focal abnormality. Adrenals/Urinary Tract: Adrenal glands are unremarkable. Kidneys are normal, without renal calculi, focal lesion, or hydronephrosis. Bladder is unremarkable. Stomach/Bowel: The stomach appears normal. The appendix appears normal. There is no evidence of bowel obstruction. As noted previously, wall thickening of the second and third portions of the duodenum is noted which may represent primary inflammation, or secondary inflammation secondary to adjacent pancreatitis. Vascular/Lymphatic: No significant vascular findings are present. No enlarged abdominal or pelvic lymph nodes. Reproductive: Prostate is unremarkable. Other: No hernia is  noted. Large amount of fluid is seen extending from the pancreas along the right pericolic gutter into the pelvis. Musculoskeletal: No acute or significant osseous findings. IMPRESSION: Large amount of fluid is noted around the pancreas most consistent with acute pancreatitis. Correlation with lipase level is recommended. This fluid is seen to extend along the right pericolic gutter into the pelvis. Well-defined pseudocyst is not seen at this time. There is noted wall thickening of the second and third portions of the duodenum which most likely represent secondary inflammation from the adjacent pancreatitis, but peptic ulcer disease cannot be excluded. Electronically Signed   By: JMarijo Conception M.D.   On: 04/05/2018 09:11    Procedures Procedures (including critical care time)  Medications Ordered in ED Medications  sodium chloride 0.9 % bolus 1,000 mL (0 mLs Intravenous Stopped 04/05/18 1323)  morphine 4 MG/ML injection 4 mg (4 mg Intravenous Given 04/05/18 1325)     Initial Impression / Assessment and Plan / ED Course  I have reviewed the triage vital signs and the nursing notes.  Pertinent labs & imaging results that were available during my care of the patient were reviewed by me and considered in my medical decision making (see chart for details).  52year old male presents for evaluation of acute pancreatitis after this was found on CT scan. I believe this is most likely due to significantly elevated triglycerides which were 1240 yesterday. He is not a alcoholic and does not have any gallstones on imaging. His vitals are normal. He is comfortable appearing. He has upper abdominal and RLQ tenderness. On review of his CT scan he does have tracking of fluid in to the right pericolic gutter which is likely the cause of his right sided pain. Will repeat labs today, provide pain control, discuss with GI.   CBC is remarkable for leukocytosis of 13.5 which is similar to yesterday. CMP is remarkable  for mild hyponatremia, hyperglycemia (372). He has normal LFTs and lipase interestingly. I spoke with Dr. GPenelope Coopwith GI who recommends pain control and clear liquid diet for the next several days as long as vitals are stable, pain is controlled, and he is not vomiting or very dehydrated. Discussed with the patient who is comfortable with plan. He is also requesting help with constipation. He will do a bowel clean out with Miralax and was given rx for colace.    Final Clinical Impressions(s) / ED Diagnoses   Final diagnoses:  Other acute  pancreatitis without infection or necrosis  Hypertriglyceridemia    ED Discharge Orders    None       Recardo Evangelist, PA-C 04/05/18 1614    Daleen Bo, MD 04/06/18 [phone]

## 2018-04-05 NOTE — ED Notes (Signed)
Reports RLQ abd pain x1 week - constant - rates pain 7/10 - denies n/v/d; last normal BM Monday only after receiving enema; BM prior to that was 6/29 - also only with enema; presenting resting comfortably on stretcher with wife at bedside - had OP CT scan - ordered by PCP - which revealed acute pancreatitis - no h/o same

## 2018-04-05 NOTE — Discharge Instructions (Signed)
Follow a clear liquid diet for the next 2-3 days Make a follow up appt with your doctor Take pain medicine as needed for severe pain - this may make you more constipated  For constipation: Starting tomorrow, take 6 capfuls of MiraLAX in a 32 oz bottle of Gatorade (low sugar) over 2-4 hour period.  On day 2, take 3 capfuls, three times a day.  On day 3, take 1 capful 3 times a day. Slowly cut back as needed until you have normal bowel movements.

## 2018-04-07 ENCOUNTER — Telehealth: Payer: Self-pay | Admitting: Internal Medicine

## 2018-04-07 ENCOUNTER — Ambulatory Visit: Payer: Self-pay

## 2018-04-07 DIAGNOSIS — Z794 Long term (current) use of insulin: Principal | ICD-10-CM

## 2018-04-07 DIAGNOSIS — E118 Type 2 diabetes mellitus with unspecified complications: Secondary | ICD-10-CM

## 2018-04-07 NOTE — Telephone Encounter (Signed)
Copied from [place] [id number]. Topic: Quick Communication - Rx Refill/Question >> Apr 07, 2018 12:45 PM Tim Wilson wrote: Medication: Prednisone  Pt states he has just gotten out of the hospital and is needing the medication above, contact pt to advise

## 2018-04-07 NOTE — Telephone Encounter (Signed)
Call placed to pt. Is requesting Prednisone Rx as he thinks he had a flare-up of gout.   Reported he just got out of the hospital for pancreatitis.  Stated he was prescribed Vicodin for this, and feels it has contributed to a gout flare up.  C/o "severe pain inner aspect of left ankle, that is hot, red, and swollen.  Reported he "can hardly walk".  Rated pain at "9/10."  Stated he has had gout flare-ups before.  Denied fever/ chills, rash, calf pain, or lower leg swelling. Reported the symptoms started about 3:00 AM on Thursday morning.  Due to severity of symptoms, advised he needs to be seen today, within 4 hours.  Reported he does not have any transportation until his family gets off work. Stated due to the pain, he is not able to drive himself.  Pt. agreed to go to [facility], after his family gets home today, after 5:00 PM.    Advised will make Dr. Ronnald Ramp aware. Verb. Understanding.          Reason for Disposition . [1] SEVERE pain (e.g., excruciating, unable to walk) AND [2] not improved after 2 hours of pain medicine  Answer Assessment - Initial Assessment Questions 1. ONSET: "When did the pain start?"      About 3:00 AM on Thursday morning  2. LOCATION: "Where is the pain located?"     Left ankle  3. PAIN: "How bad is the pain?"    (Scale 1-10; or mild, moderate, severe)  - MILD (1-3): doesn't interfere with normal activities   - MODERATE (4-7): interferes with normal activities (e.g., work or school) or awakens from sleep, limping   - SEVERE (8-10): excruciating pain, unable to do any normal activities, unable to walk      9/10 4. WORK OR EXERCISE: "Has there been any recent work or exercise that involved this part of the body?"      No  5. CAUSE: "What do you think is causing the ankle pain?"    Gout flare up  6. OTHER SYMPTOMS: "Do you have any other symptoms?" (e.g., calf pain, rash, fever, swelling)     Denied fever, rash, calf pain, leg swelling ; c/o hot, red,  and swollen inner aspect  left ankle  Protocols used: ANKLE PAIN-A-AH  Oliver Pila B 2 hours ago (12:46 PM)     Copied from [place] [id number]. Topic: Quick Communication - Rx Refill/Question >> Apr 07, 2018 12:45 PM Oliver Pila B wrote: Medication: Prednisone  Pt states he has just gotten out of the hospital and is needing the medication above, contact pt to advise

## 2018-04-07 NOTE — Telephone Encounter (Signed)
See Triage note 04/07/18

## 2018-04-07 NOTE — Telephone Encounter (Signed)
Pharmacy is calling for clarification on Rx- please advise

## 2018-04-07 NOTE — Telephone Encounter (Signed)
Copied from [place] [id number]. Topic: Quick Communication - See Telephone Encounter >> Apr 07, 2018  9:24 AM Hewitt Shorts wrote: Pharmacy is calling to confirm the militers for toujeo  [facility] [phone]

## 2018-04-11 MED ORDER — INSULIN GLARGINE 300 UNIT/ML ~~LOC~~ SOPN: 50.0000 [IU] | PEN_INJECTOR | Freq: Every day | SUBCUTANEOUS | 1 refills | Status: DC

## 2018-04-11 NOTE — Telephone Encounter (Signed)
FYI: patient is going to [facility] after 5 pm due to transportation issues.

## 2018-04-11 NOTE — Telephone Encounter (Signed)
Pt needs a refill of his prednisone.  Please advise on refill  Please send to [facility]

## 2018-04-11 NOTE — Telephone Encounter (Signed)
Called pharmacy and they stated that the pens come in 3 ml/900 units per pen and 2 pens come in a box.   Pharmacy is calling back with some additional information regarding the most cost effective way to dispense medication for the patient.   Resent per pharmacy request as 15 ml with 1 refill.

## 2018-04-11 NOTE — Telephone Encounter (Signed)
Pt was seen at the ED for pancreatitis, and was told to f/u w/PCP.  Prednisone is not on med list and pt will need to make f/u w/MD if need.Marland KitchenJohny Wilson

## 2018-04-12 NOTE — Telephone Encounter (Signed)
Appt made with [facility].

## 2018-04-12 NOTE — Telephone Encounter (Signed)
Copied from [place] [id number]. Topic: Quick Communication - Rx Refill/Question >> Apr 07, 2018 12:45 PM Oliver Pila B wrote: Medication: Prednisone  Pt states he has just gotten out of the hospital and is needing the medication above, contact pt to advise  New- 04/12/18 Pt calling back again today and is requesting a call back concerning this med. Pt states Dr Ronnald Ramp has prescribed before and he does not understand why he needs to be seen and why this cannot be called in.   Pt really wants someone to call him asap.

## 2018-04-13 ENCOUNTER — Other Ambulatory Visit (INDEPENDENT_AMBULATORY_CARE_PROVIDER_SITE_OTHER): Payer: Federal, State, Local not specified - PPO

## 2018-04-13 ENCOUNTER — Other Ambulatory Visit: Payer: Self-pay | Admitting: Family

## 2018-04-13 ENCOUNTER — Encounter: Payer: Self-pay | Admitting: Family

## 2018-04-13 ENCOUNTER — Ambulatory Visit: Payer: Federal, State, Local not specified - PPO | Admitting: Family

## 2018-04-13 VITALS — BP 128/86 | HR 92 | Temp 98.3°F | Ht 68.0 in | Wt 191.0 lb

## 2018-04-13 DIAGNOSIS — M109 Gout, unspecified: Secondary | ICD-10-CM

## 2018-04-13 DIAGNOSIS — R899 Unspecified abnormal finding in specimens from other organs, systems and tissues: Secondary | ICD-10-CM

## 2018-04-13 DIAGNOSIS — Z8719 Personal history of other diseases of the digestive system: Secondary | ICD-10-CM

## 2018-04-13 LAB — CBC WITH DIFFERENTIAL/PLATELET
BASOS ABS: 0.2 10*3/uL — AB (ref 0.0–0.1)
BASOS PCT: 1.4 % (ref 0.0–3.0)
EOS ABS: 0 10*3/uL (ref 0.0–0.7)
Eosinophils Relative: 0 % (ref 0.0–5.0)
HEMATOCRIT: 37 % — AB (ref 39.0–52.0)
Hemoglobin: 12.3 g/dL — ABNORMAL LOW (ref 13.0–17.0)
LYMPHS ABS: 2 10*3/uL (ref 0.7–4.0)
Lymphocytes Relative: 15.9 % (ref 12.0–46.0)
MCHC: 33.3 g/dL (ref 30.0–36.0)
MCV: 80.9 fl (ref 78.0–100.0)
MONO ABS: 0.3 10*3/uL (ref 0.1–1.0)
Monocytes Relative: 2.4 % — ABNORMAL LOW (ref 3.0–12.0)
NEUTROS ABS: 10 10*3/uL — AB (ref 1.4–7.7)
NEUTROS PCT: 80.3 % — AB (ref 43.0–77.0)
PLATELETS: 543 10*3/uL — AB (ref 150.0–400.0)
RBC: 4.57 Mil/uL (ref 4.22–5.81)
RDW: 13.4 % (ref 11.5–15.5)
WBC: 12.5 10*3/uL — ABNORMAL HIGH (ref 4.0–10.5)

## 2018-04-13 LAB — AMYLASE: Amylase: 36 U/L (ref 27–131)

## 2018-04-13 LAB — COMPREHENSIVE METABOLIC PANEL
ALT: 17 U/L (ref 0–53)
AST: 12 U/L (ref 0–37)
Albumin: 3.9 g/dL (ref 3.5–5.2)
Alkaline Phosphatase: 52 U/L (ref 39–117)
BUN: 10 mg/dL (ref 6–23)
CALCIUM: 9.9 mg/dL (ref 8.4–10.5)
CHLORIDE: 100 meq/L (ref 96–112)
CO2: 25 mEq/L (ref 19–32)
CREATININE: 0.87 mg/dL (ref 0.40–1.50)
GFR: 118.36 mL/min (ref >60.00)
GLUCOSE: 368 mg/dL — AB (ref 70–99)
Potassium: 4.3 mEq/L (ref 3.5–5.1)
SODIUM: 136 meq/L (ref 135–145)
Total Bilirubin: 0.3 mg/dL (ref 0.2–1.2)
Total Protein: 9 g/dL — ABNORMAL HIGH (ref 6.0–8.3)

## 2018-04-13 LAB — LIPASE: LIPASE: 41 U/L (ref 11.0–59.0)

## 2018-04-13 LAB — URIC ACID: Uric Acid, Serum: 4 mg/dL (ref 4.0–7.8)

## 2018-04-13 MED ORDER — INDOMETHACIN 50 MG PO CAPS: 50.0000 mg | ORAL_CAPSULE | Freq: Three times a day (TID) | ORAL | 0 refills | Status: DC | PRN

## 2018-04-13 MED FILL — INDOMETHACIN 50 MG CAPSULE: 50 | 20 days supply | Qty: 60 | Fill #0

## 2018-04-13 NOTE — Progress Notes (Signed)
Tim Wilson is a 52 y.o. male with the following history as recorded in Epic[employer]:  Patient Active Problem List   Diagnosis Date Noted  . Constipation 04/04/2018  . Deficiency anemia 01/20/2017  . Screen for colon cancer 01/20/2017  . DOE (dyspnea on exertion) 01/20/2017  . Snoring 09/21/2016  . Routine general medical examination at a health care facility 07/13/2012  . Hyperlipidemia with target LDL less than 100 08/06/2010  . MICROALBUMINURIA 07/27/2010  . Type II diabetes mellitus with manifestations ([place]) 07/10/2010  . HYPERTRIGLYCERIDEMIA 10/16/2009  . Essential hypertension 10/16/2009    Current Outpatient Medications  Medication Sig Dispense Refill  . aspirin 325 MG EC tablet Take 325 mg by mouth daily.      . Blood Glucose Monitoring Suppl (ONE TOUCH ULTRA 2) W/DEVICE KIT Use two times a day to test blood sugar     . dapagliflozin propanediol (FARXIGA) 10 MG TABS tablet Take 10 mg by mouth daily. 90 tablet 3  . docusate sodium (COLACE) 100 MG capsule Take 1 capsule (100 mg total) by mouth every 12 (twelve) hours. 60 capsule 0  . fenofibrate (TRICOR) 145 MG tablet TAKE 1 TABLET (145 MG TOTAL) BY MOUTH DAILY. 90 tablet 0  . glucose blood (ONETOUCH VERIO) test strip 1 each by Other route 2 (two) times daily. And lancets 2/day 250.01 100 each 12  . HYDROcodone-acetaminophen (NORCO/VICODIN) 5-325 MG tablet Take 1 tablet by mouth every 4 (four) hours as needed. 20 tablet 0  . Icosapent Ethyl (VASCEPA) 1 g CAPS Take 2 capsules (2 g total) by mouth 2 (two) times daily. 360 capsule 1  . insulin aspart (NOVOLOG FLEXPEN) 100 UNIT/ML FlexPen Inject 12 Units into the skin 3 (three) times daily with meals. And pen needles 3/day 15 mL 3  . Insulin Glargine (TOUJEO MAX SOLOSTAR) 300 UNIT/ML SOPN Inject 50 Units into the skin daily. 15 mL 1  . Insulin Pen Needle (NOVOFINE) 32G X 6 MM MISC 1 Act by Does not apply route 4 (four) times daily. 200 each 3  . olmesartan (BENICAR) 20 MG tablet Take 1  tablet (20 mg total) by mouth daily. 90 tablet 3  . rosuvastatin (CRESTOR) 20 MG tablet TAKE 1 TABLET BY MOUTH DAILY 90 tablet 2  . indomethacin (INDOCIN) 50 MG capsule Take 1 capsule (50 mg total) by mouth 3 (three) times daily as needed. Use as directed until gout flare resolves 60 capsule 0   No current facility-administered medications for this visit.     Allergies: Ramipril  Past Medical History:  Diagnosis Date  . Diabetes mellitus   . Hyperlipidemia   . Hypertension     History reviewed. No pertinent surgical history.  Family History  Problem Relation Age of Onset  . Heart disease Father   . Heart disease Brother   . Diabetes Mother   . Breast cancer Cousin   . Colon cancer Neg Hx   . Stomach cancer Neg Hx   . Esophageal cancer Neg Hx     Social History   Tobacco Use  . Smoking status: Never Smoker  . Smokeless tobacco: Never Used  Substance Use Topics  . Alcohol use: Yes    Alcohol/week: 1.2 oz    Types: 2 Cans of beer per week    Comment: on occasion, not in excess    Subjective:  Patient had called last week with concerns that he was having a gout flare and could not bear weight on his right ankle; he had  told our office that he would going to [facility] for [facility] because he could not wait to get an appointment here; patient notes that he actually opted against going to [facility] and just opted to "treat the symptoms as best he could." He is wondering if he could get another refill on prednisone that he has used in the past with gout flares. He states the pain is persisting under the bottom of his right foot. He denies any swelling or redness in his ankle or feet today.  He was recently hospitalized for pancreatitis and felt that the Vicodin he was given for pain relief triggered his most recent gout attack.    Objective:  Vitals:   04/13/18 0847  BP: 128/86  Pulse: 92  Temp: 98.3 F (36.8 C)  TempSrc: Oral  SpO2: 96%  Weight: 191 lb 0.6 oz (86.7 kg)   Height: '5\' 8"'  (1.727 m)    General: Well developed, well nourished, in no acute distress  Skin : Warm and dry.  Head: Normocephalic and atraumatic  Eyes: Sclera and conjunctiva clear; pupils round and reactive to light; extraocular movements intact  Ears: External normal; canals clear; tympanic membranes normal  Oropharynx: Pink, supple. No suspicious lesions  Neck: Supple without thyromegaly, adenopathy  Lungs: Respirations unlabored; Musculoskeletal: No deformities; no active joint inflammation  Extremities: No edema, cyanosis, clubbing  Vessels: Symmetric bilaterally  Neurologic: Alert and oriented; speech intact; face symmetrical; moves all extremities well; CNII-XII intact without focal deficit   Assessment:  1. Gout, unspecified cause, unspecified chronicity, unspecified site   2. History of pancreatitis     Plan:  1. Patient's physical exam is reassuring- no warmth or swelling or tenderness noted over joints in his foot; do not feel that patient needs oral prednisone; can try Indocin 50 mg tid prn for pain; will update uric acid level as this has not been checked since 2012; based on location of pain, am more suspicious for a muscular source of pain in his foot- not actually gout; 2. Update pancreatic enzymes today; keep planned follow-up with GI as scheduled. Patient is also encouraged to see his endocrinologist in follow-up regarding his Type 2 Diabetes.    No follow-ups on file.  Orders Placed This Encounter  Procedures  . Uric acid    Standing Status:   Future    Number of Occurrences:   1    Standing Expiration Date:   04/13/2019  . Comp Met (CMET)    Standing Status:   Future    Number of Occurrences:   1    Standing Expiration Date:   04/13/2019  . CBC w/Diff    Standing Status:   Future    Number of Occurrences:   1    Standing Expiration Date:   04/13/2019  . Amylase    Standing Status:   Future    Number of Occurrences:   1    Standing Expiration Date:    04/13/2019  . Lipase    Standing Status:   Future    Number of Occurrences:   1    Standing Expiration Date:   04/13/2019    Requested Prescriptions   Signed Prescriptions Disp Refills  . indomethacin (INDOCIN) 50 MG capsule 60 capsule 0    Sig: Take 1 capsule (50 mg total) by mouth 3 (three) times daily as needed. Use as directed until gout flare resolves

## 2018-04-13 NOTE — Patient Instructions (Signed)

## 2018-05-01 ENCOUNTER — Other Ambulatory Visit (INDEPENDENT_AMBULATORY_CARE_PROVIDER_SITE_OTHER): Payer: Federal, State, Local not specified - PPO

## 2018-05-01 DIAGNOSIS — R899 Unspecified abnormal finding in specimens from other organs, systems and tissues: Secondary | ICD-10-CM

## 2018-05-01 LAB — CBC WITH DIFFERENTIAL/PLATELET
Basophils Absolute: 0.1 10*3/uL (ref 0.0–0.1)
Basophils Relative: 1.2 % (ref 0.0–3.0)
EOS PCT: 2.1 % (ref 0.0–5.0)
Eosinophils Absolute: 0.1 10*3/uL (ref 0.0–0.7)
HCT: 38.1 % — ABNORMAL LOW (ref 39.0–52.0)
Hemoglobin: 13 g/dL (ref 13.0–17.0)
LYMPHS ABS: 3.2 10*3/uL (ref 0.7–4.0)
Lymphocytes Relative: 52.5 % — ABNORMAL HIGH (ref 12.0–46.0)
MCHC: 34.2 g/dL (ref 30.0–36.0)
MCV: 80.7 fl (ref 78.0–100.0)
MONO ABS: 0.6 10*3/uL (ref 0.1–1.0)
Monocytes Relative: 9.3 % (ref 3.0–12.0)
NEUTROS PCT: 34.9 % — AB (ref 43.0–77.0)
Neutro Abs: 2.2 10*3/uL (ref 1.4–7.7)
Platelets: 308 10*3/uL (ref 150.0–400.0)
RBC: 4.73 Mil/uL (ref 4.22–5.81)
RDW: 13.5 % (ref 11.5–15.5)
WBC: 6.2 10*3/uL (ref 4.0–10.5)

## 2018-06-06 ENCOUNTER — Encounter: Payer: Self-pay | Admitting: Endocrinology

## 2018-06-06 ENCOUNTER — Ambulatory Visit (INDEPENDENT_AMBULATORY_CARE_PROVIDER_SITE_OTHER): Payer: Federal, State, Local not specified - PPO | Admitting: Endocrinology

## 2018-06-06 VITALS — BP 108/70 | HR 80 | Ht 68.0 in | Wt 192.0 lb

## 2018-06-06 DIAGNOSIS — Z794 Long term (current) use of insulin: Secondary | ICD-10-CM

## 2018-06-06 DIAGNOSIS — E118 Type 2 diabetes mellitus with unspecified complications: Secondary | ICD-10-CM | POA: Diagnosis not present

## 2018-06-06 LAB — POCT GLYCOSYLATED HEMOGLOBIN (HGB A1C): Hemoglobin A1C: 14.6 % — AB (ref 4.0–5.6)

## 2018-06-06 MED ORDER — INSULIN ASPART 100 UNIT/ML FLEXPEN: 18.0000 [IU] | PEN_INJECTOR | Freq: Three times a day (TID) | SUBCUTANEOUS | 3 refills | Status: DC

## 2018-06-06 MED FILL — NOVOFINE 32G NEEDLES: 32G X 6 MM | 50 days supply | Qty: 200 | Fill #0

## 2018-06-06 MED FILL — NOVOLOG FLEXPEN SYRINGE: 100 | 28 days supply | Qty: 15 | Fill #0

## 2018-06-06 NOTE — Progress Notes (Signed)
Subjective:    Patient ID: Tim Wilson, male    DOB: Mar 21, 1966, 52 y.o.   MRN: [id number]  HPI Pt returns for f/u of diabetes mellitus: DM type: Insulin-requiring type 2.   Dx'ed: 1497 Complications: none Therapy: insulin since 2019, and 2 oral meds.   DKA: never Severe hypoglycemia: never.  Pancreatitis: once, in 2019 Other: he took insulin in 2012, and 2014-2015; he did not tolerate metformin-XR or invokana (diarrhea with both); he stopped pioglitizone due to fatigue and DOE; he works 1st shift; he takes multiple daily injections; he also has hypertriglyceridemia.  Interval hx: no cbg record, but states cbg's vary from 100-300.  It is in general higher as the day goes on.  Pt says he never misses the novolog.  He does not take the toujeo.  He recently had a episode of pancreatitis.  He feels better now.   Past Medical History:  Diagnosis Date  . Diabetes mellitus   . Hyperlipidemia   . Hypertension     No past surgical history on file.  Social History   Socioeconomic History  . Marital status: Married    Spouse name: Not on file  . Number of children: Not on file  . Years of education: Not on file  . Highest education level: Not on file  Occupational History  . 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 Financial resource strain: Not on file  . Food insecurity:    Worry: Not on file    Inability: Not on file  . Transportation needs:    Medical: Not on file    Non-medical: Not on file  Tobacco Use  . Smoking status: Never Smoker  . Smokeless tobacco: Never Used  Substance and Sexual Activity  . Alcohol use: Yes    Alcohol/week: 2.0 standard drinks    Types: 2 Cans of beer per week    Comment: on occasion, not in excess  . Drug use: No  . Sexual activity: Yes  Lifestyle  . Physical activity:    Days per week: Not on file    Minutes per session: Not on file    . Stress: Not on file  Relationships  . Social connections:    Talks on phone: Not on file    Gets together: Not on file    Attends religious service: Not on file    Active member of club or organization: Not on file    Attends meetings of clubs or organizations: Not on file    Relationship status: Not on file  . Intimate partner violence:    Fear of current or ex partner: Not on file    Emotionally abused: Not on file    Physically abused: Not on file    Forced sexual activity: Not on file  Other Topics Concern  . Not on file  Social History Narrative   Regular exercise-yes    Current Outpatient Medications on File Prior to Visit  Medication Sig Dispense Refill  . aspirin 325 MG EC tablet Take 325 mg by mouth daily.      . Blood Glucose Monitoring Suppl (ONE TOUCH ULTRA 2) W/DEVICE KIT Use two times a day to test blood sugar     . dapagliflozin propanediol (FARXIGA) 10 MG TABS tablet Take 10 mg by mouth daily. 90 tablet 3  . fenofibrate (TRICOR) 145 MG tablet TAKE 1 TABLET (145  MG TOTAL) BY MOUTH DAILY. 90 tablet 0  . glucose blood (ONETOUCH VERIO) test strip 1 each by Other route 2 (two) times daily. And lancets 2/day 250.01 100 each 12  . indomethacin (INDOCIN) 50 MG capsule Take 1 capsule (50 mg total) by mouth 3 (three) times daily as needed. Use as directed until gout flare resolves 60 capsule 0  . Insulin Pen Needle (NOVOFINE) 32G X 6 MM MISC 1 Act by Does not apply route 4 (four) times daily. 200 each 3  . olmesartan (BENICAR) 20 MG tablet Take 1 tablet (20 mg total) by mouth daily. 90 tablet 3  . rosuvastatin (CRESTOR) 20 MG tablet TAKE 1 TABLET BY MOUTH DAILY 90 tablet 2   No current facility-administered medications on file prior to visit.     Allergies  Allergen Reactions  . Ramipril     cough    Family History  Problem Relation Age of Onset  . Heart disease Father   . Heart disease Brother   . Diabetes Mother   . Breast cancer Cousin   . Colon cancer Neg  Hx   . Stomach cancer Neg Hx   . Esophageal cancer Neg Hx     BP 108/70 (BP Location: Left Arm)   Pulse 80   Ht '5\' 8"'$  (1.727 m)   Wt 192 lb (87.1 kg)   SpO2 97%   BMI 29.19 kg/m    Review of Systems He denies hypoglycemia.  He has lost weight.     Objective:   Physical Exam VITAL SIGNS:  See vs page GENERAL: no distress Pulses: foot pulses are intact bilaterally.   MSK: no deformity of the feet or ankles.  CV: no edema of the legs or ankles Skin:  no ulcer on the feet or ankles.  normal color and temp on the feet and ankles Neuro: sensation is intact to touch on the feet and ankles.     Lab Results  Component Value Date   CREATININE 0.87 04/13/2018   BUN 10 04/13/2018   NA 136 04/13/2018   K 4.3 04/13/2018   CL 100 04/13/2018   CO2 25 04/13/2018    Lab Results  Component Value Date   CHOL 527 (H) 04/04/2018   HDL 15.80 (L) 04/04/2018   LDLCALC NOT CALC 09/21/2016   LDLDIRECT 148.0 04/04/2018   TRIG (H) 04/04/2018    1240.0 Triglyceride is over 400; calculations on Lipids are invalid.   CHOLHDL 33 04/04/2018      Assessment & Plan:  Insulin-requiring type 2 DM: worse.    Hypertriglyceridemia: We may need to change to qd insulin, but we'll continue multiple daily injections, because of this.    Pancreatitis: this limits rx options.   Patient Instructions  check your blood sugar twice a day.  vary the time of day when you check, between before the 3 meals, and at bedtime.  also check if you have symptoms of your blood sugar being too high or too low.  please keep a record of the readings and bring it to your next appointment here.  You can write it on any piece of paper.  please call us sooner if your blood sugar goes below 70, or if you have a lot of readings over 200.   I have sent a prescription to your pharmacy, to increase the Novolog to 18 units 3 times a day (just before each meal).  Please continue the same other diabetes medications.   Please come back  for a follow-up appointment in 2-3 weeks.

## 2018-06-06 NOTE — Patient Instructions (Addendum)
check your blood sugar twice a day.  vary the time of day when you check, between before the 3 meals, and at bedtime.  also check if you have symptoms of your blood sugar being too high or too low.  please keep a record of the readings and bring it to your next appointment here.  You can write it on any piece of paper.  please call us sooner if your blood sugar goes below 70, or if you have a lot of readings over 200.   I have sent a prescription to your pharmacy, to increase the Novolog to 18 units 3 times a day (just before each meal).  Please continue the same other diabetes medications.   Please come back for a follow-up appointment in 2-3 weeks.

## 2018-06-14 DIAGNOSIS — Z7289 Other problems related to lifestyle: Secondary | ICD-10-CM | POA: Diagnosis not present

## 2018-06-14 DIAGNOSIS — H6121 Impacted cerumen, right ear: Secondary | ICD-10-CM | POA: Diagnosis not present

## 2018-06-27 ENCOUNTER — Ambulatory Visit (INDEPENDENT_AMBULATORY_CARE_PROVIDER_SITE_OTHER): Payer: Federal, State, Local not specified - PPO | Admitting: Endocrinology

## 2018-06-27 DIAGNOSIS — Z794 Long term (current) use of insulin: Secondary | ICD-10-CM | POA: Diagnosis not present

## 2018-06-27 DIAGNOSIS — E118 Type 2 diabetes mellitus with unspecified complications: Secondary | ICD-10-CM | POA: Diagnosis not present

## 2018-06-27 MED ORDER — INSULIN ASPART 100 UNIT/ML FLEXPEN: 22.0000 [IU] | PEN_INJECTOR | Freq: Three times a day (TID) | SUBCUTANEOUS | 3 refills | Status: DC

## 2018-06-27 MED FILL — NOVOLOG FLEXPEN SYRINGE: 100 | 23 days supply | Qty: 15 | Fill #0

## 2018-06-27 NOTE — Progress Notes (Signed)
Subjective:    Patient ID: Tim Wilson, male    DOB: January 12, 1966, 52 y.o.   MRN: [id number]  HPI Pt returns for f/u of diabetes mellitus: DM type: Insulin-requiring type 2.   Dx'ed: 3474 Complications: none Therapy: insulin since 2019, and 2 oral meds.   DKA: never Severe hypoglycemia: never.  Pancreatitis: once, in 2019 Other: he also took insulin in 2012, and 2014-2015; he did not tolerate metformin-XR or invokana (diarrhea with both); he stopped pioglitizone due to fatigue and DOE; he works 1st shift; he takes multiple daily injections; he also has hypertriglyceridemia.  Interval hx: no cbg record, but states cbg's vary from 90-300's.  It is in general higher as the day goes on.  Pt says he never misses the novolog.  He does not take the toujeo.  He recently had a episode of pancreatitis.  He feels better now.  Past Medical History:  Diagnosis Date  . Diabetes mellitus   . Hyperlipidemia   . Hypertension     No past surgical history on file.  Social History   Socioeconomic History  . Marital status: Married    Spouse name: Not on file  . Number of children: Not on file  . Years of education: Not on file  . Highest education level: Not on file  Occupational History  . 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 Financial resource strain: Not on file  . Food insecurity:    Worry: Not on file    Inability: Not on file  . Transportation needs:    Medical: Not on file    Non-medical: Not on file  Tobacco Use  . Smoking status: Never Smoker  . Smokeless tobacco: Never Used  Substance and Sexual Activity  . Alcohol use: Yes    Alcohol/week: 2.0 standard drinks    Types: 2 Cans of beer per week    Comment: on occasion, not in excess  . Drug use: No  . Sexual activity: Yes  Lifestyle  . Physical activity:    Days per week: Not on file    Minutes per session: Not on  file  . Stress: Not on file  Relationships  . Social connections:    Talks on phone: Not on file    Gets together: Not on file    Attends religious service: Not on file    Active member of club or organization: Not on file    Attends meetings of clubs or organizations: Not on file    Relationship status: Not on file  . Intimate partner violence:    Fear of current or ex partner: Not on file    Emotionally abused: Not on file    Physically abused: Not on file    Forced sexual activity: Not on file  Other Topics Concern  . Not on file  Social History Narrative   Regular exercise-yes    Current Outpatient Medications on File Prior to Visit  Medication Sig Dispense Refill  . aspirin 325 MG EC tablet Take 325 mg by mouth daily.      . Blood Glucose Monitoring Suppl (ONE TOUCH ULTRA 2) W/DEVICE KIT Use two times a day to test blood sugar     . dapagliflozin propanediol (FARXIGA) 10 MG TABS tablet Take 10 mg by mouth daily. 90 tablet 3  . fenofibrate (TRICOR) 145 MG tablet TAKE 1 TABLET (145 MG  TOTAL) BY MOUTH DAILY. 90 tablet 0  . glucose blood (ONETOUCH VERIO) test strip 1 each by Other route 2 (two) times daily. And lancets 2/day 250.01 100 each 12  . indomethacin (INDOCIN) 50 MG capsule Take 1 capsule (50 mg total) by mouth 3 (three) times daily as needed. Use as directed until gout flare resolves 60 capsule 0  . olmesartan (BENICAR) 20 MG tablet Take 1 tablet (20 mg total) by mouth daily. 90 tablet 3  . rosuvastatin (CRESTOR) 20 MG tablet TAKE 1 TABLET BY MOUTH DAILY 90 tablet 2   No current facility-administered medications on file prior to visit.     Allergies  Allergen Reactions  . Ramipril     cough    Family History  Problem Relation Age of Onset  . Heart disease Father   . Heart disease Brother   . Diabetes Mother   . Breast cancer Cousin   . Colon cancer Neg Hx   . Stomach cancer Neg Hx   . Esophageal cancer Neg Hx     BP 110/82   Pulse 78   Ht '5\' 8"'  (1.727  m)   Wt 197 lb (89.4 kg)   SpO2 96%   BMI 29.95 kg/m    Review of Systems He denies hypoglycemia.     Objective:   Physical Exam VITAL SIGNS:  See vs page GENERAL: no distress Pulses: foot pulses are intact bilaterally.   MSK: no deformity of the feet or ankles.  CV: no edema of the legs or ankles Skin:  no ulcer on the feet or ankles.  normal color and temp on the feet and ankles Neuro: sensation is intact to touch on the feet and ankles.     Lab Results  Component Value Date   HGBA1C 14.6 (A) 06/06/2018      Assessment & Plan:  Insulin-requiring type 2 DM: he needs increased rx.  We discussed.  he declines fructosamine and sooner f/u Hypertriglyceridemia: in this context, mealtime insulin is preferred.  Pancreatitis: this limits rx options.  Patient Instructions  check your blood sugar twice a day.  vary the time of day when you check, between before the 3 meals, and at bedtime.  also check if you have symptoms of your blood sugar being too high or too low.  please keep a record of the readings and bring it to your next appointment here.  You can write it on any piece of paper.  please call us sooner if your blood sugar goes below 70, or if you have a lot of readings over 200.   I have sent a prescription to your pharmacy, to increase the Novolog to 22 units 3 times a day (just before each meal).  Please continue the same other diabetes medications.   Please come back for a follow-up appointment in 2 months.

## 2018-06-27 NOTE — Patient Instructions (Addendum)
check your blood sugar twice a day.  vary the time of day when you check, between before the 3 meals, and at bedtime.  also check if you have symptoms of your blood sugar being too high or too low.  please keep a record of the readings and bring it to your next appointment here.  You can write it on any piece of paper.  please call us sooner if your blood sugar goes below 70, or if you have a lot of readings over 200.   I have sent a prescription to your pharmacy, to increase the Novolog to 22 units 3 times a day (just before each meal).  Please continue the same other diabetes medications.   Please come back for a follow-up appointment in 2 months.

## 2018-07-27 ENCOUNTER — Other Ambulatory Visit: Payer: Self-pay | Admitting: Internal Medicine

## 2018-07-27 DIAGNOSIS — I10 Essential (primary) hypertension: Secondary | ICD-10-CM

## 2018-07-27 DIAGNOSIS — E781 Pure hyperglyceridemia: Secondary | ICD-10-CM

## 2018-07-27 DIAGNOSIS — Z794 Long term (current) use of insulin: Secondary | ICD-10-CM

## 2018-07-27 DIAGNOSIS — R809 Proteinuria, unspecified: Secondary | ICD-10-CM

## 2018-07-27 DIAGNOSIS — E118 Type 2 diabetes mellitus with unspecified complications: Secondary | ICD-10-CM

## 2018-07-27 DIAGNOSIS — E785 Hyperlipidemia, unspecified: Secondary | ICD-10-CM

## 2018-07-27 MED FILL — ROSUVASTATIN CALCIUM 20 MG: 20 | 90 days supply | Qty: 90 | Fill #1

## 2018-07-27 NOTE — Telephone Encounter (Signed)
Requested medication (s) are due for refill today:   Crestor being requested too early.   Requested medication (s) are on the active medication list:  Yes   Future visit scheduled:   No  PCP:  Dr. Scarlette Calico   Last ordered: Feno  04/05/18  #90  0 refills                        Benicar 09/21/16  #90  3 refills                       Crestor 04/15/18  #90  2 refills   Requested Prescriptions  Pending Prescriptions Disp Refills   fenofibrate (TRICOR) 145 MG tablet 90 tablet 0    Sig: Take 1 tablet (145 mg total) by mouth daily.     Cardiovascular:  Antilipid - Fibric Acid Derivatives Failed - 07/27/2018 10:23 AM      Failed - Total Cholesterol in normal range and within 360 days    Cholesterol  Date Value Ref Range Status  04/04/2018 527 (H) 0 - 200 mg/dL Final    Comment:    ATP III Classification       Desirable:  < 200 mg/dL               Borderline High:  200 - 239 mg/dL          High:  > = 240 mg/dL         Failed - LDL in normal range and within 360 days    LDL Cholesterol  Date Value Ref Range Status  09/21/2016 NOT CALC <100 mg/dL Final    Comment:      Not calculated due to Triglyceride >400. Suggest ordering Direct LDL (Unit Code: [phone]).          Failed - HDL in normal range and within 360 days    HDL  Date Value Ref Range Status  04/04/2018 15.80 (L) >39.00 mg/dL Final         Failed - Triglycerides in normal range and within 360 days    Triglycerides  Date Value Ref Range Status  04/04/2018 (H) 0.0 - 149.0 mg/dL Final   1240.0 Triglyceride is over 400; calculations on Lipids are invalid.    Comment:    Normal:  <150 mg/dLBorderline High:  150 - 199 mg/dL         Passed - ALT in normal range and within 180 days    ALT  Date Value Ref Range Status  04/13/2018 17 0 - 53 U/L Final         Passed - AST in normal range and within 180 days    AST  Date Value Ref Range Status  04/13/2018 12 0 - 37 U/L Final         Passed - Cr in normal range and  within 180 days    Creat  Date Value Ref Range Status  09/21/2016 1.01 0.70 - 1.33 mg/dL Final    Comment:      For patients > or = 52 years of age: The upper reference limit for Creatinine is approximately 13% higher for people identified as African-American.      Creatinine, Ser  Date Value Ref Range Status  04/13/2018 0.87 0.40 - 1.50 mg/dL Final         Passed - eGFR in normal range and within 180 days    GFR calc Af Wyvonnia Lora  Date Value Ref Range Status  04/05/2018 >60 >60 mL/min Final    Comment:    (NOTE) The eGFR has been calculated using the CKD EPI equation. This calculation has not been validated in all clinical situations. eGFR's persistently <60 mL/min signify possible Chronic Kidney Disease.    GFR calc non Af Amer  Date Value Ref Range Status  04/05/2018 >60 >60 mL/min Final   GFR  Date Value Ref Range Status  04/13/2018 118.36 >60.00 mL/min Final         Passed - Valid encounter within last 12 months    Recent Outpatient Visits          3 months ago Gout, unspecified cause, unspecified chronicity, unspecified site   [place], Marvis Repress, FNP   3 months ago Essential hypertension   [place], Thomas L, MD   1 year ago [place], Thomas L, MD   1 year ago Routine general medical examination at a health care facility   [place], Thomas L, MD   2 years ago Hyperlipidemia with target LDL less than [place], Thomas L, MD            olmesartan (BENICAR) 20 MG tablet 90 tablet 3    Sig: Take 1 tablet (20 mg total) by mouth daily.     Cardiovascular:  Angiotensin Receptor Blockers Passed - 07/27/2018 10:23 AM      Passed - Cr in normal range and within 180 days    Creat  Date Value Ref Range Status  09/21/2016 1.01 0.70 - 1.33 mg/dL Final     Comment:      For patients > or = 52 years of age: The upper reference limit for Creatinine is approximately 13% higher for people identified as African-American.      Creatinine, Ser  Date Value Ref Range Status  04/13/2018 0.87 0.40 - 1.50 mg/dL Final         Passed - K in normal range and within 180 days    Potassium  Date Value Ref Range Status  04/13/2018 4.3 3.5 - 5.1 mEq/L Final         Passed - Patient is not pregnant      Passed - Last BP in normal range    BP Readings from Last 1 Encounters:  06/27/18 110/82         Passed - Valid encounter within last 6 months    Recent Outpatient Visits          3 months ago Gout, unspecified cause, unspecified chronicity, unspecified site   [place], Marvis Repress, FNP   3 months ago Essential hypertension   [place], Thomas L, MD   1 year ago [place], Thomas L, MD   1 year ago Routine general medical examination at a health care facility   [place], Thomas L, MD   2 years ago Hyperlipidemia with target LDL less than [place], Thomas L, MD            rosuvastatin (CRESTOR) 20 MG tablet 90 tablet 2    Sig: Take 1 tablet (20 mg total) by mouth daily.     Cardiovascular:  Antilipid -  Statins Failed - 07/27/2018 10:23 AM      Failed - Total Cholesterol in normal range and within 360 days    Cholesterol  Date Value Ref Range Status  04/04/2018 527 (H) 0 - 200 mg/dL Final    Comment:    ATP III Classification       Desirable:  < 200 mg/dL               Borderline High:  200 - 239 mg/dL          High:  > = 240 mg/dL         Failed - LDL in normal range and within 360 days    LDL Cholesterol  Date Value Ref Range Status  09/21/2016 NOT CALC <100 mg/dL Final    Comment:      Not calculated due to Triglyceride  >400. Suggest ordering Direct LDL (Unit Code: [phone]).          Failed - HDL in normal range and within 360 days    HDL  Date Value Ref Range Status  04/04/2018 15.80 (L) >39.00 mg/dL Final         Failed - Triglycerides in normal range and within 360 days    Triglycerides  Date Value Ref Range Status  04/04/2018 (H) 0.0 - 149.0 mg/dL Final   1240.0 Triglyceride is over 400; calculations on Lipids are invalid.    Comment:    Normal:  <150 mg/dLBorderline High:  150 - 199 mg/dL         Passed - Patient is not pregnant      Passed - Valid encounter within last 12 months    Recent Outpatient Visits          3 months ago Gout, unspecified cause, unspecified chronicity, unspecified site   [place], Marvis Repress, FNP   3 months ago Essential hypertension   [place], Thomas L, MD   1 year ago [place], Thomas L, MD   1 year ago Routine general medical examination at a health care facility   [place], Thomas L, MD   2 years ago Hyperlipidemia with target LDL less than [facility] Camel, MD

## 2018-07-27 NOTE — Telephone Encounter (Signed)
Copied from [place] [id number]. Topic: Quick Communication - Rx Refill/Question >> Jul 27, 2018  9:58 AM Oneta Rack wrote:  Relation to pt: self  Call back number: [phone] Pharmacy: [place], [place] - [address] [phone] (Phone)  [phone] (Fax)    Reason for call:  Patient contacted pharmacy and was advised to contact PCP office and schedule appointment. Patient states he just had his CPE 04/04/18 and declined appt. Patient requesting 90 day supply of the following medication:  fenofibrate (TRICOR) 145 MG tablet , olmesartan (BENICAR) 20 MG tablet, rosuvastatin (CRESTOR) 20 MG tablet. Patient informed please allow 48 to 72 hour turn around time, please advise

## 2018-07-28 MED ORDER — FENOFIBRATE 145 MG PO TABS: 145.0000 mg | ORAL_TABLET | Freq: Every day | ORAL | 1 refills | Status: DC

## 2018-07-28 MED ORDER — ROSUVASTATIN CALCIUM 20 MG PO TABS: 20.0000 mg | ORAL_TABLET | Freq: Every day | ORAL | 1 refills | Status: DC

## 2018-07-28 MED ORDER — OLMESARTAN MEDOXOMIL 20 MG PO TABS: 20.0000 mg | ORAL_TABLET | Freq: Every day | ORAL | 1 refills | Status: DC

## 2018-07-28 MED FILL — OLMESARTAN MEDOXOMIL 20 MG: 20 | 90 days supply | Qty: 90 | Fill #0

## 2018-07-28 MED FILL — FENOFIBRATE 145 MG TABLET: 145 | 90 days supply | Qty: 90 | Fill #0

## 2018-09-11 ENCOUNTER — Encounter: Payer: Self-pay | Admitting: Endocrinology

## 2018-09-11 ENCOUNTER — Ambulatory Visit: Payer: Federal, State, Local not specified - PPO | Admitting: Endocrinology

## 2018-09-11 VITALS — BP 132/88 | HR 90 | Ht 68.0 in | Wt 206.0 lb

## 2018-09-11 DIAGNOSIS — E118 Type 2 diabetes mellitus with unspecified complications: Secondary | ICD-10-CM | POA: Diagnosis not present

## 2018-09-11 DIAGNOSIS — Z794 Long term (current) use of insulin: Secondary | ICD-10-CM

## 2018-09-11 LAB — POCT GLYCOSYLATED HEMOGLOBIN (HGB A1C): Hemoglobin A1C: 11.1 % — AB (ref 4.0–5.6)

## 2018-09-11 MED ORDER — INSULIN ASPART PROT & ASPART (70-30 MIX) 100 UNIT/ML PEN: 35.0000 [IU] | PEN_INJECTOR | Freq: Every day | SUBCUTANEOUS | 11 refills | Status: DC

## 2018-09-11 MED FILL — NOVOLOG MIX 70-30 FLEXPEN S: (70-30) 100 | 43 days supply | Qty: 15 | Fill #0

## 2018-09-11 NOTE — Progress Notes (Signed)
Subjective:    Patient ID: Mauri Reading, male    DOB: 08-24-1966, 52 y.o.   MRN: [id number]  HPI Pt returns for f/u of diabetes mellitus: DM type: Insulin-requiring type 2.   Dx'ed: 2924 Complications: none Therapy: insulin since 2019, and 2 oral meds.   DKA: never Severe hypoglycemia: never.  Pancreatitis: once, in 2019 Other: he also took insulin in 2012, and 2014-2015; he did not tolerate metformin-XR or invokana (diarrhea with both); he stopped pioglitizone due to fatigue and DOE; he works 1st shift; he takes multiple daily injections; he also has hypertriglyceridemia.  Interval hx: He takes 14 units BID (he reduced due to hypoglycemia).  no cbg record, but states cbg's vary from 80-200's Past Medical History:  Diagnosis Date  . Diabetes mellitus   . Hyperlipidemia   . Hypertension     No past surgical history on file.  Social History   Socioeconomic History  . Marital status: Married    Spouse name: Not on file  . Number of children: Not on file  . Years of education: Not on file  . Highest education level: Not on file  Occupational History  . Occupation: Restaurant manager, fast food: [employer]    Comment: coaches basketball at [employer]  . Financial resource strain: Not on file  . Food insecurity:    Worry: Not on file    Inability: Not on file  . Transportation needs:    Medical: Not on file    Non-medical: Not on file  Tobacco Use  . Smoking status: Never Smoker  . Smokeless tobacco: Never Used  Substance and Sexual Activity  . Alcohol use: Yes    Alcohol/week: 2.0 standard drinks    Types: 2 Cans of beer per week    Comment: on occasion, not in excess  . Drug use: No  . Sexual activity: Yes  Lifestyle  . Physical activity:    Days per week: Not on file    Minutes per session: Not on file  . Stress: Not on file  Relationships  . Social connections:    Talks on phone: Not on file    Gets together: Not on  file    Attends religious service: Not on file    Active member of club or organization: Not on file    Attends meetings of clubs or organizations: Not on file    Relationship status: Not on file  . Intimate partner violence:    Fear of current or ex partner: Not on file    Emotionally abused: Not on file    Physically abused: Not on file    Forced sexual activity: Not on file  Other Topics Concern  . Not on file  Social History Narrative   Regular exercise-yes    Current Outpatient Medications on File Prior to Visit  Medication Sig Dispense Refill  . aspirin 325 MG EC tablet Take 325 mg by mouth daily.      . Blood Glucose Monitoring Suppl (ONE TOUCH ULTRA 2) W/DEVICE KIT Use two times a day to test blood sugar     . fenofibrate (TRICOR) 145 MG tablet Take 1 tablet (145 mg total) by mouth daily. 90 tablet 1  . glucose blood (ONETOUCH VERIO) test strip 1 each by Other route 2 (two) times daily. And lancets 2/day 250.01 100 each 12  . olmesartan (BENICAR) 20 MG tablet Take 1 tablet (20 mg total) by mouth daily.  90 tablet 1  . rosuvastatin (CRESTOR) 20 MG tablet Take 1 tablet (20 mg total) by mouth daily. 90 tablet 1  . dapagliflozin propanediol (FARXIGA) 10 MG TABS tablet Take 10 mg by mouth daily. (Patient not taking: Reported on 09/11/2018) 90 tablet 3  . indomethacin (INDOCIN) 50 MG capsule Take 1 capsule (50 mg total) by mouth 3 (three) times daily as needed. Use as directed until gout flare resolves (Patient not taking: Reported on 09/11/2018) 60 capsule 0   No current facility-administered medications on file prior to visit.     Allergies  Allergen Reactions  . Ramipril     cough    Family History  Problem Relation Age of Onset  . Heart disease Father   . Heart disease Brother   . Diabetes Mother   . Breast cancer Cousin   . Colon cancer Neg Hx   . Stomach cancer Neg Hx   . Esophageal cancer Neg Hx     BP 132/88 (BP Location: Right Arm, Patient Position: Sitting,  Cuff Size: Normal)   Pulse 90   Ht '5\' 8"'  (1.727 m)   Wt 206 lb (93.4 kg)   SpO2 96%   BMI 31.32 kg/m   Review of Systems He has lost a few lbs.      Objective:   Physical Exam VITAL SIGNS:  See vs page GENERAL: no distress Pulses: dorsalis pedis intact bilat.   MSK: no deformity of the feet CV: no leg edema Skin:  no ulcer on the feet.  normal color and temp on the feet. Neuro: sensation is intact to touch on the feet  Lab Results  Component Value Date   HGBA1C 11.1 (A) 09/11/2018   Lab Results  Component Value Date   CREATININE 0.87 04/13/2018   BUN 10 04/13/2018   NA 136 04/13/2018   K 4.3 04/13/2018   CL 100 04/13/2018   CO2 25 04/13/2018       Assessment & Plan:  Insulin-requiring type 2 DM: ongoing poor glycemic control.  He should change to a qd regimen Hypoglycemia: this limits aggressiveness of DM rx.    Patient Instructions  I have sent a prescription to your pharmacy, to change the "Novolog 70/30," 35 units with breakfast. On this type of insulin schedule, you should eat meals on a regular schedule.  If a meal is missed or significantly delayed, your blood sugar could go low.   check your blood sugar twice a day.  vary the time of day when you check, between before the 3 meals, and at bedtime.  also check if you have symptoms of your blood sugar being too high or too low.  please keep a record of the readings and bring it to your next appointment here (or you can bring the meter itself).  You can write it on any piece of paper.  please call us sooner if your blood sugar goes below 70, or if you have a lot of readings over 200.   Please come back for a follow-up appointment in 2 months.

## 2018-09-11 NOTE — Patient Instructions (Signed)
I have sent a prescription to your pharmacy, to change the "Novolog 70/30," 35 units with breakfast. On this type of insulin schedule, you should eat meals on a regular schedule.  If a meal is missed or significantly delayed, your blood sugar could go low.   check your blood sugar twice a day.  vary the time of day when you check, between before the 3 meals, and at bedtime.  also check if you have symptoms of your blood sugar being too high or too low.  please keep a record of the readings and bring it to your next appointment here (or you can bring the meter itself).  You can write it on any piece of paper.  please call us sooner if your blood sugar goes below 70, or if you have a lot of readings over 200.   Please come back for a follow-up appointment in 2 months.

## 2018-09-18 MED FILL — NOVOFINE 32G NEEDLES: 32G X 6 MM | 50 days supply | Qty: 200 | Fill #1

## 2018-11-23 MED FILL — OLMESARTAN MEDOXOMIL 20 MG: 20 | 90 days supply | Qty: 90 | Fill #1

## 2018-11-23 MED FILL — FENOFIBRATE 145 MG TABLET: 145 | 90 days supply | Qty: 90 | Fill #1

## 2018-11-23 MED FILL — ROSUVASTATIN CALCIUM 20 MG: 20 | 90 days supply | Qty: 90 | Fill #2

## 2018-12-13 ENCOUNTER — Encounter: Payer: Self-pay | Admitting: Endocrinology

## 2018-12-13 ENCOUNTER — Ambulatory Visit (INDEPENDENT_AMBULATORY_CARE_PROVIDER_SITE_OTHER): Payer: Federal, State, Local not specified - PPO | Admitting: Endocrinology

## 2018-12-13 ENCOUNTER — Other Ambulatory Visit: Payer: Self-pay

## 2018-12-13 VITALS — BP 108/72 | HR 78 | Ht 68.0 in | Wt 208.2 lb

## 2018-12-13 DIAGNOSIS — E118 Type 2 diabetes mellitus with unspecified complications: Secondary | ICD-10-CM | POA: Diagnosis not present

## 2018-12-13 DIAGNOSIS — Z794 Long term (current) use of insulin: Secondary | ICD-10-CM

## 2018-12-13 LAB — POCT GLYCOSYLATED HEMOGLOBIN (HGB A1C): Hemoglobin A1C: 12.4 % — AB (ref 4.0–5.6)

## 2018-12-13 MED ORDER — GLUCOSE BLOOD VI STRP: 1.0000 | ORAL_STRIP | Freq: Two times a day (BID) | 12 refills | Status: DC

## 2018-12-13 MED ORDER — INSULIN GLARGINE (2 UNIT DIAL) 300 UNIT/ML ~~LOC~~ SOPN: 40.0000 [IU] | PEN_INJECTOR | SUBCUTANEOUS | 11 refills | Status: DC

## 2018-12-13 MED FILL — TOUJEO MAX SOLOSTAR 300 UNI: 300 | 22 days supply | Qty: 3 | Fill #0

## 2018-12-13 NOTE — Progress Notes (Signed)
Subjective:    Patient ID: Tim Wilson, male    DOB: July 22, 1966, 53 y.o.   MRN: [id number]  HPI Pt returns for f/u of diabetes mellitus: DM type: Insulin-requiring type 2.   Dx'ed: 3474 Complications: none Therapy: insulin since 2019, and 2 oral meds.   DKA: never Severe hypoglycemia: never.  Pancreatitis: once, in 2019 Other: he also took insulin in 2012, and 2014-2015; he did not tolerate metformin-XR or invokana (diarrhea with both); he stopped pioglitizone due to fatigue and DOE; he works 1st shift; he takes QD insulin, after poor results with multiple daily injections; he also has hypertriglyceridemia.  Interval hx: He takes 20 units BID (he reduced due to hypoglycemia).  no cbg record, but states cbg's vary from 100-300's.  It is in general higher as the day goes on.  He often skips lunch.   Past Medical History:  Diagnosis Date  . Diabetes mellitus   . Hyperlipidemia   . Hypertension     No past surgical history on file.  Social History   Socioeconomic History  . Marital status: Married    Spouse name: Not on file  . Number of children: Not on file  . Years of education: Not on file  . Highest education level: Not on file  Occupational History  . 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 Financial resource strain: Not on file  . Food insecurity:    Worry: Not on file    Inability: Not on file  . Transportation needs:    Medical: Not on file    Non-medical: Not on file  Tobacco Use  . Smoking status: Never Smoker  . Smokeless tobacco: Never Used  Substance and Sexual Activity  . Alcohol use: Yes    Alcohol/week: 2.0 standard drinks    Types: 2 Cans of beer per week    Comment: on occasion, not in excess  . Drug use: No  . Sexual activity: Yes  Lifestyle  . Physical activity:    Days per week: Not on file    Minutes per session: Not on file  . Stress:  Not on file  Relationships  . Social connections:    Talks on phone: Not on file    Gets together: Not on file    Attends religious service: Not on file    Active member of club or organization: Not on file    Attends meetings of clubs or organizations: Not on file    Relationship status: Not on file  . Intimate partner violence:    Fear of current or ex partner: Not on file    Emotionally abused: Not on file    Physically abused: Not on file    Forced sexual activity: Not on file  Other Topics Concern  . Not on file  Social History Narrative   Regular exercise-yes    Current Outpatient Medications on File Prior to Visit  Medication Sig Dispense Refill  . aspirin 325 MG EC tablet Take 325 mg by mouth daily.      . fenofibrate (TRICOR) 145 MG tablet Take 1 tablet (145 mg total) by mouth daily. 90 tablet 1  . indomethacin (INDOCIN) 50 MG capsule Take 1 capsule (50 mg total) by mouth 3 (three) times daily as needed. Use as directed until gout flare resolves 60 capsule 0  . olmesartan (BENICAR) 20 MG tablet Take 1  tablet (20 mg total) by mouth daily. 90 tablet 1  . rosuvastatin (CRESTOR) 20 MG tablet Take 1 tablet (20 mg total) by mouth daily. 90 tablet 1   No current facility-administered medications on file prior to visit.     Allergies  Allergen Reactions  . Ramipril     cough    Family History  Problem Relation Age of Onset  . Heart disease Father   . Heart disease Brother   . Diabetes Mother   . Breast cancer Cousin   . Colon cancer Neg Hx   . Stomach cancer Neg Hx   . Esophageal cancer Neg Hx     BP 108/72 (BP Location: Left Arm, Patient Position: Sitting, Cuff Size: Normal)   Pulse 78   Ht 5\' 8"  (1.727 m)   Wt 208 lb 3.2 oz (94.4 kg)   SpO2 94%   BMI 31.66 kg/m    Review of Systems He denies hypoglycemia.      Objective:   Physical Exam VITAL SIGNS:  See vs page GENERAL: no distress Pulses: dorsalis pedis intact bilat.   MSK: no deformity of the  feet CV: no leg edema Skin:  no ulcer on the feet.  normal color and temp on the feet. Neuro: sensation is intact to touch on the feet  Lab Results  Component Value Date   HGBA1C 12.4 (A) 12/13/2018   Lab Results  Component Value Date   CREATININE 0.87 04/13/2018   BUN 10 04/13/2018   NA 136 04/13/2018   K 4.3 04/13/2018   CL 100 04/13/2018   CO2 25 04/13/2018       Assessment & Plan:  Insulin-requiring type 2 DM: worse Noncompliance with cbg recording and insulin dosing.  Due to skipping lunch, he should take QD toujeo only.    Patient Instructions  I have sent a prescription to your pharmacy, to change the current insulin to toujeo only.   On this type of insulin schedule, you should eat meals on a regular schedule.  If a meal is missed or significantly delayed, your blood sugar could go low.   check your blood sugar twice a day.  vary the time of day when you check, between before the 3 meals, and at bedtime.  also check if you have symptoms of your blood sugar being too high or too low.  please keep a record of the readings and bring it to your next appointment here (or you can bring the meter itself).  You can write it on any piece of paper.  please call us sooner if your blood sugar goes below 70, or if you have a lot of readings over 200.   Please come back for a follow-up appointment in 2 months.

## 2018-12-13 NOTE — Patient Instructions (Addendum)
I have sent a prescription to your pharmacy, to change the current insulin to toujeo only.   On this type of insulin schedule, you should eat meals on a regular schedule.  If a meal is missed or significantly delayed, your blood sugar could go low.   check your blood sugar twice a day.  vary the time of day when you check, between before the 3 meals, and at bedtime.  also check if you have symptoms of your blood sugar being too high or too low.  please keep a record of the readings and bring it to your next appointment here (or you can bring the meter itself).  You can write it on any piece of paper.  please call us sooner if your blood sugar goes below 70, or if you have a lot of readings over 200.   Please come back for a follow-up appointment in 2 months.

## 2018-12-27 LAB — HM DIABETES EYE EXAM

## 2019-02-09 MED FILL — TOUJEO MAX SOLOSTAR 300 UNI: 300 | 22 days supply | Qty: 3 | Fill #1

## 2019-03-02 ENCOUNTER — Ambulatory Visit: Payer: Federal, State, Local not specified - PPO | Admitting: Endocrinology

## 2019-04-30 MED FILL — NOVOFINE 32G NEEDLES: 32G X 6 MM | 34 days supply | Qty: 100 | Fill #0

## 2019-04-30 MED FILL — TOUJEO MAX SOLOSTAR 300 UNI: 300 | 22 days supply | Qty: 3 | Fill #2

## 2019-05-10 ENCOUNTER — Telehealth: Payer: Self-pay | Admitting: Internal Medicine

## 2019-05-10 NOTE — Telephone Encounter (Signed)
Can you let her know that we have not seen her since July of 2019. She will need an appt for refills.

## 2019-05-10 NOTE — Telephone Encounter (Signed)
Pt scheduled  

## 2019-05-10 NOTE — Telephone Encounter (Signed)
Medication Refill - Medication: olmesartan (BENICAR) 20 MG tablet  rosuvastatin (CRESTOR) 20 MG tablet fenofibrate (TRICOR) 145 MG tablet    Has the patient contacted their pharmacy? Yes.  Pt states the pharmacy has faxed over requests. Pt states he is out of medications. Please advise.  (Agent: If no, request that the patient contact the pharmacy for the refill.) (Agent: If yes, when and what did the pharmacy advise?)  Preferred Pharmacy (with phone number or street name):  [place], [address]  [place] [address]  Phone: [phone] Fax: [phone]  Not a 24 hour pharmacy; exact hours not known.     Agent: Please be advised that RX refills may take up to 3 business days. We ask that you follow-up with your pharmacy.

## 2019-05-14 ENCOUNTER — Other Ambulatory Visit: Payer: Self-pay | Admitting: Internal Medicine

## 2019-05-14 DIAGNOSIS — R809 Proteinuria, unspecified: Secondary | ICD-10-CM

## 2019-05-14 DIAGNOSIS — Z794 Long term (current) use of insulin: Secondary | ICD-10-CM

## 2019-05-14 DIAGNOSIS — E781 Pure hyperglyceridemia: Secondary | ICD-10-CM

## 2019-05-14 DIAGNOSIS — I10 Essential (primary) hypertension: Secondary | ICD-10-CM

## 2019-05-14 DIAGNOSIS — E118 Type 2 diabetes mellitus with unspecified complications: Secondary | ICD-10-CM

## 2019-05-15 ENCOUNTER — Ambulatory Visit (INDEPENDENT_AMBULATORY_CARE_PROVIDER_SITE_OTHER): Payer: Federal, State, Local not specified - PPO | Admitting: Internal Medicine

## 2019-05-15 ENCOUNTER — Telehealth: Payer: Self-pay | Admitting: Internal Medicine

## 2019-05-15 ENCOUNTER — Other Ambulatory Visit: Payer: Self-pay

## 2019-05-15 ENCOUNTER — Other Ambulatory Visit (INDEPENDENT_AMBULATORY_CARE_PROVIDER_SITE_OTHER): Payer: Federal, State, Local not specified - PPO

## 2019-05-15 ENCOUNTER — Encounter: Payer: Self-pay | Admitting: Internal Medicine

## 2019-05-15 VITALS — BP 160/98 | HR 69 | Temp 98.7°F | Resp 16 | Ht 68.0 in | Wt 199.0 lb

## 2019-05-15 DIAGNOSIS — E118 Type 2 diabetes mellitus with unspecified complications: Secondary | ICD-10-CM | POA: Diagnosis not present

## 2019-05-15 DIAGNOSIS — I10 Essential (primary) hypertension: Secondary | ICD-10-CM

## 2019-05-15 DIAGNOSIS — E785 Hyperlipidemia, unspecified: Secondary | ICD-10-CM

## 2019-05-15 DIAGNOSIS — R809 Proteinuria, unspecified: Secondary | ICD-10-CM

## 2019-05-15 DIAGNOSIS — D539 Nutritional anemia, unspecified: Secondary | ICD-10-CM

## 2019-05-15 DIAGNOSIS — Z0000 Encounter for general adult medical examination without abnormal findings: Secondary | ICD-10-CM | POA: Diagnosis not present

## 2019-05-15 DIAGNOSIS — E559 Vitamin D deficiency, unspecified: Secondary | ICD-10-CM

## 2019-05-15 DIAGNOSIS — Z0001 Encounter for general adult medical examination with abnormal findings: Secondary | ICD-10-CM

## 2019-05-15 DIAGNOSIS — Z125 Encounter for screening for malignant neoplasm of prostate: Secondary | ICD-10-CM

## 2019-05-15 DIAGNOSIS — G4733 Obstructive sleep apnea (adult) (pediatric): Secondary | ICD-10-CM

## 2019-05-15 DIAGNOSIS — R800 Isolated proteinuria: Secondary | ICD-10-CM

## 2019-05-15 DIAGNOSIS — E781 Pure hyperglyceridemia: Secondary | ICD-10-CM

## 2019-05-15 DIAGNOSIS — J069 Acute upper respiratory infection, unspecified: Secondary | ICD-10-CM

## 2019-05-15 DIAGNOSIS — Z794 Long term (current) use of insulin: Secondary | ICD-10-CM

## 2019-05-15 LAB — BASIC METABOLIC PANEL
BUN: 10 mg/dL (ref 6–23)
CO2: 26 mEq/L (ref 19–32)
Calcium: 10 mg/dL (ref 8.4–10.5)
Chloride: 100 mEq/L (ref 96–112)
Creatinine, Ser: 1.06 mg/dL (ref 0.40–1.50)
GFR: 88.29 mL/min (ref >60.00)
Glucose, Bld: 250 mg/dL — ABNORMAL HIGH (ref 70–99)
Potassium: 4 mEq/L (ref 3.5–5.1)
Sodium: 134 mEq/L — ABNORMAL LOW (ref 135–145)

## 2019-05-15 LAB — MICROALBUMIN / CREATININE URINE RATIO
Creatinine,U: 122.9 mg/dL
Microalb Creat Ratio: 50.4 mg/g — ABNORMAL HIGH (ref 0.0–30.0)
Microalb, Ur: 62 mg/dL — ABNORMAL HIGH (ref 0.0–1.9)

## 2019-05-15 LAB — PSA: PSA: 0.5 ng/mL (ref 0.10–4.00)

## 2019-05-15 LAB — HEMOGLOBIN A1C: Hgb A1c MFr Bld: 12.9 % — ABNORMAL HIGH (ref 4.6–6.5)

## 2019-05-15 LAB — URINALYSIS, ROUTINE W REFLEX MICROSCOPIC
Bilirubin Urine: NEGATIVE
Ketones, ur: NEGATIVE
Leukocytes,Ua: NEGATIVE
Nitrite: NEGATIVE
Specific Gravity, Urine: 1.025 (ref 1.000–1.030)
Total Protein, Urine: 100 — AB
Urine Glucose: >=1000 — AB
Urobilinogen, UA: 0.2 (ref 0.0–1.0)
pH: 5.5 (ref 5.0–8.0)

## 2019-05-15 LAB — CBC WITH DIFFERENTIAL/PLATELET
Basophils Absolute: 0 10*3/uL (ref 0.0–0.1)
Basophils Relative: 0.6 % (ref 0.0–3.0)
Eosinophils Absolute: 0.1 10*3/uL (ref 0.0–0.7)
Eosinophils Relative: 1.6 % (ref 0.0–5.0)
HCT: 42.4 % (ref 39.0–52.0)
Hemoglobin: 14.3 g/dL (ref 13.0–17.0)
Lymphocytes Relative: 56.9 % — ABNORMAL HIGH (ref 12.0–46.0)
Lymphs Abs: 2.9 10*3/uL (ref 0.7–4.0)
MCHC: 33.7 g/dL (ref 30.0–36.0)
MCV: 80.4 fl (ref 78.0–100.0)
Monocytes Absolute: 0.3 10*3/uL (ref 0.1–1.0)
Monocytes Relative: 6.6 % (ref 3.0–12.0)
Neutro Abs: 1.8 10*3/uL (ref 1.4–7.7)
Neutrophils Relative %: 34.3 % — ABNORMAL LOW (ref 43.0–77.0)
Platelets: 279 10*3/uL (ref 150.0–400.0)
RBC: 5.27 Mil/uL (ref 4.22–5.81)
RDW: 13.8 % (ref 11.5–15.5)
WBC: 5.1 10*3/uL (ref 4.0–10.5)

## 2019-05-15 LAB — LIPID PANEL
Cholesterol: 236 mg/dL — ABNORMAL HIGH (ref 0–200)
HDL: 28.2 mg/dL — ABNORMAL LOW (ref >39.00)
Total CHOL/HDL Ratio: 8
Triglycerides: 1462 mg/dL — ABNORMAL HIGH (ref 0.0–149.0)

## 2019-05-15 LAB — IBC PANEL
Iron: 77 ug/dL (ref 42–165)
Saturation Ratios: 20.6 % (ref 20.0–50.0)
Transferrin: 267 mg/dL (ref 212.0–360.0)

## 2019-05-15 LAB — FERRITIN: Ferritin: 1024.7 ng/mL — ABNORMAL HIGH (ref 22.0–322.0)

## 2019-05-15 LAB — LDL CHOLESTEROL, DIRECT: Direct LDL: 49 mg/dL

## 2019-05-15 LAB — FOLATE: Folate: 19.7 ng/mL (ref >5.9)

## 2019-05-15 LAB — TSH: TSH: 1.26 u[IU]/mL (ref 0.35–4.50)

## 2019-05-15 LAB — VITAMIN B12: Vitamin B-12: 320 pg/mL (ref 211–911)

## 2019-05-15 LAB — VITAMIN D 25 HYDROXY (VIT D DEFICIENCY, FRACTURES): VITD: 10.38 ng/mL — ABNORMAL LOW (ref 30.00–100.00)

## 2019-05-15 MED ORDER — TRULICITY 0.75 MG/0.5ML ~~LOC~~ SOAJ: 1.0000 | SUBCUTANEOUS | 0 refills | Status: DC

## 2019-05-15 MED ORDER — INDAPAMIDE 1.25 MG PO TABS: 1.2500 mg | ORAL_TABLET | Freq: Every day | ORAL | 0 refills | Status: DC

## 2019-05-15 MED FILL — INDAPAMIDE 1.25 MG TABLET: 1.25 | 90 days supply | Qty: 90 | Fill #0

## 2019-05-15 NOTE — Telephone Encounter (Signed)
Medication Refill - Medication:  rosuvastatin (CRESTOR) 20 MG tablet [[id number]] olmesartan (BENICAR) 20 MG tablet [[id number]] fenofibrate (TRICOR) 145 MG tablet [[id number] Pt stated he came in today and to get this refilled.  He stated these were not sent to the pharmacy at this visit  Has the patient contacted their pharmacy? No. (Agent: If no, request that the patient contact the pharmacy for the refill.) (Agent: If yes, when and what did the pharmacy advise?)  Preferred Pharmacy (with phone number or street name): [facility]   Agent: Please be advised that RX refills may take up to 3 business days. We ask that you follow-up with your pharmacy.

## 2019-05-15 NOTE — Progress Notes (Signed)
Subjective:  Patient ID: Tim Wilson, male    DOB: [birth year]  Age: 53 y.o. MRN: [id number]  CC: Annual Exam, Anemia, Diabetes, Hyperlipidemia, and Hypertension   HPI Tim Wilson presents for a CPX.  He complains of 5 days ago he developed chills, nonproductive cough, muscle aches, and has since lost his sense of smell.  The cough has resolved.  He denies fever, shortness of breath, rash, or lymphadenopathy.  He wants to be screened for COVID-19 infection.  He does not monitor his blood pressure.  He denies any recent episodes of headache or blurred vision.  He tells me over the last 5 days he has not had a blood sugar that less than 180.  Occasionally has been up to as high as 280.  The only glycemic agent he is using right now is basal insulin.  He is intolerant to metformin.  Outpatient Medications Prior to Visit  Medication Sig Dispense Refill   glucose blood (ONETOUCH VERIO) test strip 1 each by Other route 2 (two) times daily. And lancets 2/day 100 each 12   Insulin Glargine, 2 Unit Dial, (TOUJEO MAX SOLOSTAR) 300 UNIT/ML SOPN Inject 40 Units into the skin every morning. And lancets 2/day 5 pen 11   aspirin 325 MG EC tablet Take 325 mg by mouth daily.       fenofibrate (TRICOR) 145 MG tablet Take 1 tablet (145 mg total) by mouth daily. 90 tablet 1   indomethacin (INDOCIN) 50 MG capsule Take 1 capsule (50 mg total) by mouth 3 (three) times daily as needed. Use as directed until gout flare resolves 60 capsule 0   olmesartan (BENICAR) 20 MG tablet Take 1 tablet (20 mg total) by mouth daily. 90 tablet 1   rosuvastatin (CRESTOR) 20 MG tablet Take 1 tablet (20 mg total) by mouth daily. 90 tablet 1   No facility-administered medications prior to visit.     ROS Review of Systems  Constitutional: Positive for chills. Negative for diaphoresis, fatigue and fever.  HENT: Negative.  Negative for sore throat and trouble swallowing.   Eyes: Negative.   Respiratory: Positive for  apnea. Negative for cough, chest tightness and wheezing.   Cardiovascular: Negative for chest pain, palpitations and leg swelling.  Gastrointestinal: Negative for abdominal pain, diarrhea, nausea and vomiting.  Endocrine: Negative.  Negative for polydipsia, polyphagia and polyuria.  Genitourinary: Negative.  Negative for difficulty urinating, dysuria, hematuria, penile pain, penile swelling, scrotal swelling, testicular pain and urgency.  Musculoskeletal: Positive for myalgias. Negative for arthralgias.  Skin: Negative.  Negative for color change and pallor.  Neurological: Negative.  Negative for dizziness and weakness.  Hematological: Negative for adenopathy. Does not bruise/bleed easily.  Psychiatric/Behavioral: Negative.     Objective:  BP (!) 160/98 (BP Location: Left Arm, Patient Position: Sitting, Cuff Size: Normal)    Pulse 69    Temp 98.7 F (37.1 C) (Oral)    Resp 16    Ht 5\' 8"  (1.727 m)    Wt 199 lb (90.3 kg)    SpO2 97%    BMI 30.26 kg/m   BP Readings from Last 3 Encounters:  05/15/19 (!) 160/98  12/13/18 108/72  09/11/18 132/88    Wt Readings from Last 3 Encounters:  05/15/19 199 lb (90.3 kg)  12/13/18 208 lb 3.2 oz (94.4 kg)  09/11/18 206 lb (93.4 kg)    Physical Exam Vitals signs reviewed.  Constitutional:      General: He is not in acute distress.  Appearance: He is not ill-appearing, toxic-appearing or diaphoretic.  HENT:     Nose: Nose normal.     Mouth/Throat:     Mouth: Mucous membranes are moist.     Pharynx: No oropharyngeal exudate or posterior oropharyngeal erythema.  Eyes:     General: No scleral icterus.    Conjunctiva/sclera: Conjunctivae normal.  Neck:     Musculoskeletal: Normal range of motion. No neck rigidity or muscular tenderness.  Cardiovascular:     Rate and Rhythm: Normal rate and regular rhythm.     Heart sounds: No murmur.     Comments: EKG --- Sinus  Rhythm  WITHIN NORMAL LIMITS   Pulmonary:     Effort: Pulmonary effort is  normal. No respiratory distress.     Breath sounds: No stridor. No wheezing, rhonchi or rales.  Abdominal:     General: Abdomen is flat. Bowel sounds are normal. There is no distension.     Palpations: There is no hepatomegaly, splenomegaly or mass.     Tenderness: There is no abdominal tenderness. There is no guarding.     Hernia: There is no hernia in the left inguinal area or right inguinal area.  Genitourinary:    Pubic Area: No rash.      Penis: Normal and uncircumcised. No phimosis, paraphimosis, hypospadias, erythema, tenderness, discharge, swelling or lesions.      Scrotum/Testes: Normal.        Right: Mass or swelling not present.        Left: Mass or swelling not present.     Epididymis:     Right: Normal. Not enlarged. No mass.     Left: Normal. Not enlarged. No mass.     Prostate: Normal. Not enlarged, not tender and no nodules present.     Rectum: Normal. Guaiac result negative. No mass, tenderness, anal fissure, external hemorrhoid or internal hemorrhoid. Normal anal tone.  Musculoskeletal: Normal range of motion.     Right lower leg: No edema.     Left lower leg: No edema.  Lymphadenopathy:     Cervical: No cervical adenopathy.     Lower Body: No right inguinal adenopathy. No left inguinal adenopathy.  Skin:    General: Skin is warm and dry.  Neurological:     General: No focal deficit present.     Mental Status: He is alert.  Psychiatric:        Mood and Affect: Mood normal.        Behavior: Behavior normal.     Lab Results  Component Value Date   WBC 5.1 05/15/2019   HGB 14.3 05/15/2019   HCT 42.4 05/15/2019   PLT 279.0 05/15/2019   GLUCOSE 250 (H) 05/15/2019   CHOL 236 (H) 05/15/2019   TRIG (H) 05/15/2019    1462.0 Triglyceride is over 400; calculations on Lipids are invalid.   HDL 28.20 (L) 05/15/2019   LDLDIRECT 49.0 05/15/2019   LDLCALC NOT CALC 09/21/2016   ALT 17 04/13/2018   AST 12 04/13/2018   NA 134 (L) 05/15/2019   K 4.0 05/15/2019   CL  100 05/15/2019   CREATININE 1.06 05/15/2019   BUN 10 05/15/2019   CO2 26 05/15/2019   TSH 1.26 05/15/2019   PSA 0.50 05/15/2019   HGBA1C 12.9 (H) 05/15/2019   MICROALBUR 62.0 (H) 05/15/2019    Ct Abdomen Pelvis W Contrast  Result Date: 04/05/2018 CLINICAL DATA:  Acute right lower quadrant abdominal pain. EXAM: CT ABDOMEN AND PELVIS WITH CONTRAST TECHNIQUE: Multidetector CT  imaging of the abdomen and pelvis was performed using the standard protocol following bolus administration of intravenous contrast. CONTRAST:  16mL ISOVUE-300 IOPAMIDOL (ISOVUE-300) INJECTION 61% COMPARISON:  None. FINDINGS: Lower chest: No acute abnormality. Hepatobiliary: No focal liver abnormality is seen. No gallstones, gallbladder wall thickening, or biliary dilatation. Pancreas: Large amount of fluid is noted around the pancreatic head and duodenum most consistent with pancreatitis. No ductal dilatation is noted. Spleen: Normal in size without focal abnormality. Adrenals/Urinary Tract: Adrenal glands are unremarkable. Kidneys are normal, without renal calculi, focal lesion, or hydronephrosis. Bladder is unremarkable. Stomach/Bowel: The stomach appears normal. The appendix appears normal. There is no evidence of bowel obstruction. As noted previously, wall thickening of the second and third portions of the duodenum is noted which may represent primary inflammation, or secondary inflammation secondary to adjacent pancreatitis. Vascular/Lymphatic: No significant vascular findings are present. No enlarged abdominal or pelvic lymph nodes. Reproductive: Prostate is unremarkable. Other: No hernia is noted. Large amount of fluid is seen extending from the pancreas along the right pericolic gutter into the pelvis. Musculoskeletal: No acute or significant osseous findings. IMPRESSION: Large amount of fluid is noted around the pancreas most consistent with acute pancreatitis. Correlation with lipase level is recommended. This fluid is seen  to extend along the right pericolic gutter into the pelvis. Well-defined pseudocyst is not seen at this time. There is noted wall thickening of the second and third portions of the duodenum which most likely represent secondary inflammation from the adjacent pancreatitis, but peptic ulcer disease cannot be excluded. Electronically Signed   By: Marijo Conception, M.D.   On: 04/05/2018 09:11    Assessment & Plan:   Ellison was seen today for annual exam, anemia, diabetes, hyperlipidemia and hypertension.  Diagnoses and all orders for this visit:  Essential hypertension- His blood pressure is not adequately well controlled.  His EKG is negative for LVH or ischemia.  In addition to lifestyle modifications I asked him to add indapamide to the ARB. -     Basic metabolic panel; Future -     Urinalysis, Routine w reflex microscopic; Future -     VITAMIN D 25 Hydroxy (Vit-D Deficiency, Fractures); Future -     EKG 12-Lead -     indapamide (LOZOL) 1.25 MG tablet; Take 1 tablet (1.25 mg total) by mouth daily. -     olmesartan (BENICAR) 20 MG tablet; Take 1 tablet (20 mg total) by mouth daily.  Type II diabetes mellitus with manifestations (HCC)-his A1c is at 12.9%.  He has very poor glycemic control.  I have asked him to add a GLP-1 agonist and to follow-up with endocrinology and diabetic education. -     Basic metabolic panel; Future -     Hemoglobin A1c; Future -     Microalbumin / creatinine urine ratio; Future -     C-peptide; Future -     Amb Referral to Nutrition and Diabetic E -     Ambulatory referral to Endocrinology -     Dulaglutide (TRULICITY) 3.49 ZP/9.1TA SOPN; Inject 1 Act into the skin once a week.  Deficiency anemia- His H&H are normal now.  I will monitor him for vitamin deficiencies. -     CBC with Differential/Platelet; Future -     Vitamin B12; Future -     IBC panel; Future -     Folate; Future -     Ferritin; Future -     Vitamin B1; Future  Hyperlipidemia with target  LDL less  than 100- He has achieved his LDL goal and is doing well on the statin. -     TSH; Future -     rosuvastatin (CRESTOR) 20 MG tablet; Take 1 tablet (20 mg total) by mouth daily. -     aspirin EC 81 MG tablet; Take 1 tablet (81 mg total) by mouth daily.  HYPERTRIGLYCERIDEMIA- His triglycerides are elevated at 1462.  In addition to lifestyle modifications I have asked him to be compliant with the icosapent ethyl fish oil supplement and I recommended that he start taking fenofibrate. -     Amb Referral to Nutrition and Diabetic E -     fenofibrate (TRICOR) 145 MG tablet; Take 1 tablet (145 mg total) by mouth daily. -     Icosapent Ethyl (VASCEPA) 1 g CAPS; Take 2 capsules (2 g total) by mouth 2 (two) times daily.  Isolated proteinuria without specific morphologic lesion -     Urinalysis, Routine w reflex microscopic; Future -     Microalbumin / creatinine urine ratio; Future  Routine general medical examination at a health care facility-exam completed, labs reviewed, vaccines reviewed, screening for colon cancer is up-to-date, patient education material was given. -     Lipid panel; Future -     PSA; Future -     HIV Antibody (routine testing w rflx); Future  Obstructive sleep apnea syndrome -     Ambulatory referral to Sleep Studies  Viral upper respiratory tract infection- His symptoms are quickly resolving.  His exam is unremarkable.  He has negative for COVID-19 antibodies.  This is likely a routine viral URI that is resolving. -     SAR CoV2 Serology (COVID 19)AB(IGG)IA; Future  Type 2 diabetes mellitus with complication, with long-term current use of insulin (HCC) -     rosuvastatin (CRESTOR) 20 MG tablet; Take 1 tablet (20 mg total) by mouth daily. -     olmesartan (BENICAR) 20 MG tablet; Take 1 tablet (20 mg total) by mouth daily.  Proteinuria, unspecified type -     olmesartan (BENICAR) 20 MG tablet; Take 1 tablet (20 mg total) by mouth daily.  Vitamin D deficiency disease -      Cholecalciferol 1.25 MG (50000 UT) capsule; Take 1 capsule (50,000 Units total) by mouth once a week.   I have discontinued Antionette Fairy. Tonner's aspirin and indomethacin. I am also having him start on indapamide, Trulicity, Vascepa, Cholecalciferol, and aspirin EC. Additionally, I am having him maintain his glucose blood, Insulin Glargine (2 Unit Dial), rosuvastatin, fenofibrate, and olmesartan.  Meds ordered this encounter  Medications   indapamide (LOZOL) 1.25 MG tablet    Sig: Take 1 tablet (1.25 mg total) by mouth daily.    Dispense:  90 tablet    Refill:  0   Dulaglutide (TRULICITY) 3.35 KT/6.2BW SOPN    Sig: Inject 1 Act into the skin once a week.    Dispense:  6 pen    Refill:  0   rosuvastatin (CRESTOR) 20 MG tablet    Sig: Take 1 tablet (20 mg total) by mouth daily.    Dispense:  90 tablet    Refill:  1   fenofibrate (TRICOR) 145 MG tablet    Sig: Take 1 tablet (145 mg total) by mouth daily.    Dispense:  90 tablet    Refill:  0   olmesartan (BENICAR) 20 MG tablet    Sig: Take 1 tablet (20 mg total) by mouth daily.  Dispense:  90 tablet    Refill:  1   Icosapent Ethyl (VASCEPA) 1 g CAPS    Sig: Take 2 capsules (2 g total) by mouth 2 (two) times daily.    Dispense:  360 capsule    Refill:  1   Cholecalciferol 1.25 MG (50000 UT) capsule    Sig: Take 1 capsule (50,000 Units total) by mouth once a week.    Dispense:  12 capsule    Refill:  1   aspirin EC 81 MG tablet    Sig: Take 1 tablet (81 mg total) by mouth daily.    Dispense:  90 tablet    Refill:  1     Follow-up: Return in about 6 weeks (around 06/26/2019).  Scarlette Calico, MD

## 2019-05-15 NOTE — Patient Instructions (Signed)

## 2019-05-16 ENCOUNTER — Encounter: Payer: Self-pay | Admitting: Internal Medicine

## 2019-05-16 DIAGNOSIS — G4733 Obstructive sleep apnea (adult) (pediatric): Secondary | ICD-10-CM | POA: Insufficient documentation

## 2019-05-16 DIAGNOSIS — E559 Vitamin D deficiency, unspecified: Secondary | ICD-10-CM | POA: Insufficient documentation

## 2019-05-16 DIAGNOSIS — J069 Acute upper respiratory infection, unspecified: Secondary | ICD-10-CM | POA: Insufficient documentation

## 2019-05-16 DIAGNOSIS — E118 Type 2 diabetes mellitus with unspecified complications: Secondary | ICD-10-CM | POA: Insufficient documentation

## 2019-05-16 DIAGNOSIS — Z794 Long term (current) use of insulin: Secondary | ICD-10-CM | POA: Insufficient documentation

## 2019-05-16 MED ORDER — FENOFIBRATE 145 MG PO TABS: 145.0000 mg | ORAL_TABLET | Freq: Every day | ORAL | 0 refills | Status: DC

## 2019-05-16 MED ORDER — ROSUVASTATIN CALCIUM 20 MG PO TABS: 20.0000 mg | ORAL_TABLET | Freq: Every day | ORAL | 1 refills | Status: DC

## 2019-05-16 MED ORDER — ASPIRIN EC 81 MG PO TBEC: 81.0000 mg | DELAYED_RELEASE_TABLET | Freq: Every day | ORAL | 1 refills | Status: DC

## 2019-05-16 MED ORDER — OLMESARTAN MEDOXOMIL 20 MG PO TABS: 20.0000 mg | ORAL_TABLET | Freq: Every day | ORAL | 1 refills | Status: DC

## 2019-05-16 MED ORDER — VASCEPA 1 G PO CAPS: 2.0000 | ORAL_CAPSULE | Freq: Two times a day (BID) | ORAL | 1 refills | Status: DC

## 2019-05-16 MED ORDER — CHOLECALCIFEROL 1.25 MG (50000 UT) PO CAPS: 50000.0000 [IU] | ORAL_CAPSULE | ORAL | 1 refills | Status: DC

## 2019-05-16 MED FILL — FENOFIBRATE 145 MG TABS: 145 | 90 days supply | Qty: 90 | Fill #0

## 2019-05-16 MED FILL — OLMESARTAN MEDOXOMIL 20 MG: 20 | 90 days supply | Qty: 90 | Fill #0

## 2019-05-16 MED FILL — ASPIRIN LOW DOSE 81 MG TBEC: 81 | 120 days supply | Qty: 120 | Fill #0

## 2019-05-16 MED FILL — VIT D3-50 50,000 UNITS CAPS: 1.25 MG | 84 days supply | Qty: 12 | Fill #0

## 2019-05-16 MED FILL — TOUJEO MAX SOLOSTAR 300 UNI: 300 | 22 days supply | Qty: 3 | Fill #2

## 2019-05-16 MED FILL — NOVOFINE 32G NEEDLES: 32G X 6 MM | 34 days supply | Qty: 100 | Fill #0

## 2019-05-16 MED FILL — VASCEPA 1 GM CAPSULE: 1 | 30 days supply | Qty: 120 | Fill #0

## 2019-05-16 MED FILL — ROSUVASTATIN CALCIUM 20 MG: 20 | 90 days supply | Qty: 90 | Fill #0

## 2019-05-16 NOTE — Telephone Encounter (Signed)
These were sent in today, this morning. Pt contacted and informed of same.

## 2019-05-18 ENCOUNTER — Telehealth: Payer: Self-pay | Admitting: General Practice

## 2019-05-18 NOTE — Telephone Encounter (Signed)
Negative COVID results given. Patient results "NOT Detected." Caller expressed understanding. ° °

## 2019-05-19 LAB — VITAMIN B1: Vitamin B1 (Thiamine): 8 nmol/L (ref 8–30)

## 2019-05-19 LAB — C-PEPTIDE: C-Peptide: 3.06 ng/mL (ref 0.80–3.85)

## 2019-05-19 LAB — HIV ANTIBODY (ROUTINE TESTING W REFLEX): HIV 1&2 Ab, 4th Generation: NONREACTIVE

## 2019-05-19 LAB — SAR COV2 SEROLOGY (COVID19)AB(IGG),IA: SARS CoV2 AB IGG: NEGATIVE

## 2019-05-30 ENCOUNTER — Other Ambulatory Visit: Payer: Self-pay

## 2019-05-30 ENCOUNTER — Ambulatory Visit: Payer: Federal, State, Local not specified - PPO | Admitting: Neurology

## 2019-05-30 ENCOUNTER — Encounter: Payer: Self-pay | Admitting: Neurology

## 2019-05-30 VITALS — BP 130/85 | HR 100 | Ht 67.0 in | Wt 197.0 lb

## 2019-05-30 DIAGNOSIS — R0683 Snoring: Secondary | ICD-10-CM | POA: Diagnosis not present

## 2019-05-30 DIAGNOSIS — E669 Obesity, unspecified: Secondary | ICD-10-CM

## 2019-05-30 DIAGNOSIS — E1165 Type 2 diabetes mellitus with hyperglycemia: Secondary | ICD-10-CM

## 2019-05-30 DIAGNOSIS — R0681 Apnea, not elsewhere classified: Secondary | ICD-10-CM | POA: Diagnosis not present

## 2019-05-30 DIAGNOSIS — R351 Nocturia: Secondary | ICD-10-CM | POA: Diagnosis not present

## 2019-05-30 DIAGNOSIS — Z8249 Family history of ischemic heart disease and other diseases of the circulatory system: Secondary | ICD-10-CM

## 2019-05-30 NOTE — Progress Notes (Signed)
Subjective:    Patient ID: Tim Wilson is a 53 y.o. male.  HPI     Star Age, MD, PhD [facility] [address] [place], [place] [address]  Dear Dr. Ronnald Ramp,   I saw your patient, Tim Wilson, upon your kind request in my sleep clinic today for initial consultation of his sleep disorder, in particular, concern for underlying obstructive sleep apnea.  The patient is unaccompanied today.  As you know, [name] is a 53 year old right-handed gentleman with an underlying medical history of hypertension, hyperlipidemia, type 2 diabetes and borderline obesity, who reports snoring and witnessed apneas per GF.  I reviewed your office note from 05/15/2019. His Epworth sleepiness score is 6 out of 24, fatigue severity score is 28 out of 63.  He lives with his significant other, he has 5 children.  He is self-employed.   His diabetes control has been suboptimal for the past year plus.  His latest A1c on 05/15/2019 was 12.9.  He also has had low B12 levels.  Most recently his B12 was on the lower end of the spectrum at 320.  He was recently started on Trulicity and his home fingerstick blood sugar values in the mornings have been ranging between 100 and 150, previously between 180 and 220.  He is trying to lose weight.  He has lost about 12 to 15 pounds in the past year.  He lives with his girlfriend.  She has mentioned witnessed pauses in his breathing in the past.  He denies any morning headaches.  He admits that he has a strong family history of early cardiac death.  His father lived to be 14 years old and died of a heart attack.  He suspects that his brother who died at 79 had sleep apnea.  The patient has 5 grown children, 2 are [place], 2 in [place], one in [place].  He has no pets other than a large fish tank, sleeps with the TV on typically at night and tries to turn it off when he wakes up in the middle of the night.  He has nocturia about once  per average night but has had nocturia up to 3 times per night in the past.  He denies morning headaches.  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  He is a non-smoker and drinks alcohol infrequently, on the weekends but not every weekend and caffeine occasionally in the form of soda, maybe once a month.  His bedtime is after 9 PM and rise time between 7 and 8 typically.  His Past Medical History Is Significant For: Past Medical History:  Diagnosis Date  . Diabetes mellitus   . Hyperlipidemia   . Hypertension     His Past Surgical History Is Significant For: No past surgical history on file.  His Family History Is Significant For: Family History  Problem Relation Age of Onset  . Heart disease Father   . Heart disease Brother   . Diabetes Mother   . Breast cancer Cousin   . Colon cancer Neg Hx   . Stomach cancer Neg Hx   . Esophageal cancer Neg Hx     His Social History Is Significant For: Social History   Socioeconomic History  . Marital status: Married    Spouse name: Not on file  . Number of children: Not on file  . Years of education:  Not on file  . Highest education level: Not on file  Occupational History  . Occupation: Restaurant manager, fast food: Animator DAY    Comment: coaches basketball at [employer]  . Financial resource strain: Not on file  . Food insecurity    Worry: Not on file    Inability: Not on file  . Transportation needs    Medical: Not on file    Non-medical: Not on file  Tobacco Use  . Smoking status: Never Smoker  . Smokeless tobacco: Never Used  Substance and Sexual Activity  . Alcohol use: Yes    Alcohol/week: 2.0 standard drinks    Types: 2 Cans of beer per week    Comment: on occasion, not in excess  . Drug use: No  . Sexual activity: Yes  Lifestyle  . Physical activity    Days per week: Not on file    Minutes per  session: Not on file  . Stress: Not on file  Relationships  . Social Herbalist on phone: Not on file    Gets together: Not on file    Attends religious service: Not on file    Active member of club or organization: Not on file    Attends meetings of clubs or organizations: Not on file    Relationship status: Not on file  Other Topics Concern  . Not on file  Social History Narrative   Regular exercise-yes    His Allergies Are:  Allergies  Allergen Reactions  . Ramipril     cough  :   His Current Medications Are:  Outpatient Encounter Medications as of 05/30/2019  Medication Sig  . aspirin EC 81 MG tablet Take 1 tablet (81 mg total) by mouth daily.  . Cholecalciferol 1.25 MG (50000 UT) capsule Take 1 capsule (50,000 Units total) by mouth once a week.  . Dulaglutide (TRULICITY) A999333 0000000 SOPN Inject 1 Act into the skin once a week.  . fenofibrate (TRICOR) 145 MG tablet Take 1 tablet (145 mg total) by mouth daily.  Marland Kitchen glucose blood (ONETOUCH VERIO) test strip 1 each by Other route 2 (two) times daily. And lancets 2/day  . Icosapent Ethyl (VASCEPA) 1 g CAPS Take 2 capsules (2 g total) by mouth 2 (two) times daily.  . Insulin Glargine, 2 Unit Dial, (TOUJEO MAX SOLOSTAR) 300 UNIT/ML SOPN Inject 40 Units into the skin every morning. And lancets 2/day  . olmesartan (BENICAR) 20 MG tablet Take 1 tablet (20 mg total) by mouth daily.  . rosuvastatin (CRESTOR) 20 MG tablet Take 1 tablet (20 mg total) by mouth daily.  . [DISCONTINUED] indapamide (LOZOL) 1.25 MG tablet Take 1 tablet (1.25 mg total) by mouth daily.   No facility-administered encounter medications on file as of 05/30/2019.   :   Review of Systems:  Out of a complete 14 point review of systems, all are reviewed and negative with the exception of these symptoms as listed below:    Review of Systems  Neurological:       Pt presents today to discuss his sleep. Pt has never had a sleep study but does endorse  snoring.  Epworth Sleepiness Scale 0= would never doze 1= slight chance of dozing 2= moderate chance of dozing 3= high chance of dozing  Sitting and reading: 0 Watching TV: 2 Sitting inactive in a public place (ex. Theater or meeting): 0 As a passenger in a car for an hour  without a break: 1 Lying down to rest in the afternoon: 3 Sitting and talking to someone: 0 Sitting quietly after lunch (no alcohol): 0 In a car, while stopped in traffic: 0 Total: 6     Objective:  Neurological Exam  Physical Exam Physical Examination:   Vitals:   05/30/19 1545  BP: 130/85  Pulse: 100    General Examination: The patient is a very pleasant 53 y.o. male in no acute distress. He appears well-developed and well-nourished and well groomed.   HEENT: Normocephalic, atraumatic, pupils are equal, round and reactive to light and accommodation. Funduscopic exam is normal with sharp disc margins noted. Extraocular tracking is good without limitation to gaze excursion or nystagmus noted. Normal smooth pursuit is noted. Hearing is grossly intact. Tympanic membranes are clear bilaterally. Face is symmetric with normal facial animation and normal facial sensation. Speech is clear with no dysarthria noted. There is no hypophonia. There is no lip, neck/head, jaw or voice tremor. Neck is supple with full range of passive and active motion. There are no carotid bruits on auscultation. Oropharynx exam reveals: mild mouth dryness, adequate dental hygiene and moderate airway crowding, due to Small airway entry, larger appearing uvula, tonsils of about 1+.  Mallampati is class II.  Neck circumference is 17-7/8 inches.  He has a minimal overbite.  Tongue protrudes centrally in palate elevates symmetrically.   Chest: Clear to auscultation without wheezing, rhonchi or crackles noted.  Heart: S1+S2+0, regular and normal without murmurs, rubs or gallops noted.   Abdomen: Soft, non-tender and non-distended with normal  bowel sounds appreciated on auscultation.  Extremities: There is no pitting edema in the distal lower extremities bilaterally.  Skin: Warm and dry without trophic changes noted.  Musculoskeletal: exam reveals no obvious joint deformities, tenderness or joint swelling or erythema.   Neurologically:  Mental status: The patient is awake, alert and oriented in all 4 spheres. His immediate and remote memory, attention, language skills and fund of knowledge are appropriate. There is no evidence of aphasia, agnosia, apraxia or anomia. Speech is clear with normal prosody and enunciation. Thought process is linear. Mood is normal and affect is normal.  Cranial nerves II - XII are as described above under HEENT exam. In addition: shoulder shrug is normal with equal shoulder height noted. Motor exam: Normal bulk, strength and tone is noted. There is no drift, tremor or rebound. Romberg is neg. Fine motor skills and coordination: grossly intact.  Cerebellar testing: No dysmetria or intention tremor.  Sensory exam: intact to light touch.  Gait, station and balance: He stands easily. No veering to one side is noted. No leaning to one side is noted. Posture is age-appropriate and stance is narrow based. Gait shows normal stride length and normal pace. No problems turning are noted. Tandem walk is unremarkable.  Assessment and Plan:  In summary, JIMMY WILAND is a very pleasant 53 y.o.-year old male with an underlying medical history of hypertension, hyperlipidemia,Suboptimally controlled type 2 diabetes and borderline obesity, whose history and physical exam are concerning for obstructive sleep apnea (OSA). I had a long chat with the patient about my findings and the diagnosis of OSA, its prognosis and treatment options. We talked about medical treatments, surgical interventions and non-pharmacological approaches. I explained in particular the risks and ramifications of untreated moderate to severe OSA,  especially with respect to developing cardiovascular disease down the Road, including congestive heart failure, difficult to treat hypertension, cardiac arrhythmias, or stroke. Even type 2 diabetes  has, in part, been linked to untreated OSA. Symptoms of untreated OSA include daytime sleepiness, memory problems, mood irritability and mood disorder such as depression and anxiety, lack of energy, as well as recurrent headaches, especially morning headaches. We talked about trying to maintain a healthy lifestyle in general, as well as the importance of weight control. I encouraged the patient to eat healthy, exercise daily and keep well hydrated, to keep a scheduled bedtime and wake time routine, to not skip any meals and eat healthy snacks in between meals. I advised the patient not to drive when feeling sleepy. I recommended the following at this time: sleep study.  I explained the sleep test procedure to the patient and also outlined possible surgical and non-surgical treatment options of OSA, including the use of a custom-made dental device (which would require a referral to a specialist dentist or oral surgeon), upper airway surgical options, such as inspire and UPPP (which would involve a referral to an ENT surgeon). Rarely, jaw surgery such as mandibular advancement may be considered.  I also explained the CPAP treatment option to the patient, who indicated that he would be willing to try CPAP if the need arises. I explained the importance of being compliant with PAP treatment, not only for insurance purposes but primarily to improve His symptoms, and for the patient's long term health benefit, including to reduce His cardiovascular risks. I answered all his questions today and the patient was in agreement. I plan to see him back after the sleep study is completed and encouraged him to call with any interim questions, concerns, problems or updates.   Thank you very much for allowing me to participate in  the care of this nice patient. If I can be of any further assistance to you please do not hesitate to call me at [phone].  Sincerely,   Star Age, MD, PhD

## 2019-05-30 NOTE — Patient Instructions (Signed)

## 2019-06-13 ENCOUNTER — Ambulatory Visit (INDEPENDENT_AMBULATORY_CARE_PROVIDER_SITE_OTHER): Payer: Federal, State, Local not specified - PPO | Admitting: Neurology

## 2019-06-13 DIAGNOSIS — R351 Nocturia: Secondary | ICD-10-CM

## 2019-06-13 DIAGNOSIS — G4733 Obstructive sleep apnea (adult) (pediatric): Secondary | ICD-10-CM

## 2019-06-13 DIAGNOSIS — R0681 Apnea, not elsewhere classified: Secondary | ICD-10-CM

## 2019-06-13 DIAGNOSIS — E1165 Type 2 diabetes mellitus with hyperglycemia: Secondary | ICD-10-CM

## 2019-06-13 DIAGNOSIS — R0683 Snoring: Secondary | ICD-10-CM

## 2019-06-13 DIAGNOSIS — Z8249 Family history of ischemic heart disease and other diseases of the circulatory system: Secondary | ICD-10-CM

## 2019-06-13 DIAGNOSIS — E669 Obesity, unspecified: Secondary | ICD-10-CM

## 2019-06-21 NOTE — Progress Notes (Signed)
Patient referred by Dr. Ronnald Ramp, seen by me on 05/30/19, HST on 06/13/19:  Please call and notify the patient that the recent home sleep test did suggest the diagnosis of moderate obstructive sleep apnea and that I recommend treatment for this in the form of CPAP. I will request an overnight sleep study for proper titration and mask fitting. I have placed an order in the chart.  Star Age, MD, PhD [facility])

## 2019-06-21 NOTE — Procedures (Signed)
Patient Information     First Name: Tim Last Name: Wilson ID: [id number]  Birth Date: 1965-11-19 Age: 53 Gender: male  Referring Provider: Scarlette Calico, MD BMI: 30.8 (W=196 lb, H=5' 7'')  Neck Circ.:  18 '' Epworth:  6   Sleep Study Information    Study Date: Jun 13, 2019 S/H/A Version: 001.001.001.001 / 4.1.1528 / 57  History:     53 year old right-handed man with a history of hypertension, hyperlipidemia, type 2 diabetes and borderline obesity, who reports snoring and witnessed apneas.            Diagnosis: OSA           Recommendations: This home sleep test demonstrates moderate obstructive sleep apnea with a total AHI of 28.8/hour and O2 nadir of 84%. Given the patient's medical history and sleep related complaints, treatment with positive airway pressure (in the form of CPAP) is recommended. This will require a full night CPAP titration study for proper treatment settings, O2 monitoring and mask fitting. Based on the severity of the sleep disordered breathing an attended titration study is indicated. Please note that untreated obstructive sleep apnea may carry additional perioperative morbidity. Patients with significant obstructive sleep apnea should receive perioperative PAP therapy and the surgeons and particularly the anesthesiologist should be informed of the diagnosis and the severity of the sleep disordered breathing. The patient should be cautioned not to drive, work at heights, or operate dangerous or heavy equipment when tired or sleepy. Review and reiteration of good sleep hygiene measures should be pursued with any patient. Other causes of the patient's symptoms, including circadian rhythm disturbances, an underlying mood disorder, medication effect and/or an underlying medical problem cannot be ruled out based on this test. Clinical correlation is recommended. The patient and his referring provider will be notified of the test results. The patient will be seen in follow up in sleep  clinic at [facility].  I certify that I have reviewed the raw data recording prior to the issuance of this report in accordance with the standards of the American Academy of Sleep Medicine (AASM).  Star Age, MD, PhD [facility]) Diplomat, ABPN (Neurology and Sleep)               Sleep Summary  Oxygen Saturation Statistics   Start Study Time: End Study Time: Total Recording Time:  10:00:09 PM 6:37:23 AM 8 hrs, 37 min  Total Sleep Time % REM of Sleep Time:  7 hrs, 42 min 9.1    Mean: 94 Minimum: 84 Maximum: 99  Mean of Desaturations Nadirs (%):   91  Oxygen Desatur. %:   4-9 10-20 >20 Total  Events Number Total   122  6 95.3 4.7  0 0.0  128 100.0  Oxygen Saturation: <90 <=88 <85 <80 <70  Duration (minutes): Sleep % 4.2 0.9  2.5 0.0  0.5 0.0 0.0 0.0 0.0 0.0     Respiratory Indices      Total Events REM NREM All Night  pRDI:  208  pAHI:  206 ODI:  128  pAHIc:  32  % CSR: 0.0 23.2 23.2 9.7 3.9 29.5 29.2 18.5 4.5 29.1 28.8 17.9 4.5       Pulse Rate Statistics during Sleep (BPM)      Mean:  75 Minimum: 57 Maximum: 103    Indices are calculated using technically valid sleep time of  7 hrs, 9 min. pRDI/pAHI are calculated using oxi desaturations ? 3% Sit N/A  Body Position Statistics  Position Supine Prone Right Left Non-Supine  Sleep (min) 143.7 133.5 50.0 133.0 316.5  Sleep % 31.1 28.9 10.8 28.8 68.5  pRDI 60.7 12.2 13.2 15.4 13.8  pAHI 60.2 11.7 13.2 15.4 13.6  ODI 42.5 2.7 4.8 9.8 6.2     Snoring Statistics Snoring Level (dB) >40 >50 >60 >70 >80 >Threshold (45)  Sleep (min) 305.8 10.7 3.5 0.0 0.0 17.7  Sleep % 66.2 2.3 0.8 0.0 0.0 3.8    Mean: 41 dB Sleep Stages Chart

## 2019-06-21 NOTE — Addendum Note (Signed)
Addended by: Star Age on: 06/21/2019 07:00 PM   Modules accepted: Orders

## 2019-06-25 ENCOUNTER — Telehealth: Payer: Self-pay

## 2019-06-25 NOTE — Telephone Encounter (Signed)
I called pt to discuss his sleep study results. No answer, left a message asking him to call me back. 

## 2019-06-25 NOTE — Telephone Encounter (Signed)
-----   Message from Star Age, MD sent at 06/21/2019  7:00 PM EDT ----- Patient referred by Dr. Ronnald Ramp, seen by me on 05/30/19, HST on 06/13/19:  Please call and notify the patient that the recent home sleep test did suggest the diagnosis of moderate obstructive sleep apnea and that I recommend treatment for this in the form of CPAP. I will request an overnight sleep study for proper titration and mask fitting. I have placed an order in the chart.  Star Age, MD, PhD [facility])

## 2019-06-27 NOTE — Telephone Encounter (Signed)
I called pt to discuss his sleep study results. No answer, left a message asking him to call me back. 

## 2019-07-03 NOTE — Telephone Encounter (Signed)
I called pt. I advised pt that Dr. Rexene Alberts reviewed their sleep study results and found that pt has moderate osa and recommends that pt be treated with a cpap. Dr. Rexene Alberts recommends that pt return for a repeat sleep study in order to properly titrate the cpap and ensure a good mask fit. Pt is agreeable to returning for a titration study. I advised pt that our sleep lab will file with pt's insurance and call pt to schedule the sleep study when we hear back from the pt's insurance regarding coverage of this sleep study. Pt verbalized understanding of results. Pt had no questions at this time but was encouraged to call back if questions arise.

## 2019-07-20 ENCOUNTER — Other Ambulatory Visit (HOSPITAL_COMMUNITY)
Admission: RE | Admit: 2019-07-20 | Discharge: 2019-07-20 | Disposition: A | Discharge status: Home / Self Care | Payer: Federal, State, Local not specified - PPO | Source: Ambulatory Visit | Attending: Neurology | Admitting: Neurology

## 2019-07-20 DIAGNOSIS — Z20828 Contact with and (suspected) exposure to other viral communicable diseases: Secondary | ICD-10-CM | POA: Insufficient documentation

## 2019-07-20 DIAGNOSIS — Z01812 Encounter for preprocedural laboratory examination: Secondary | ICD-10-CM | POA: Diagnosis not present

## 2019-07-22 LAB — NOVEL CORONAVIRUS, NAA (HOSP ORDER, SEND-OUT TO REF LAB; TAT 18-24 HRS): SARS-CoV-2, NAA: NOT DETECTED

## 2019-07-23 ENCOUNTER — Ambulatory Visit (INDEPENDENT_AMBULATORY_CARE_PROVIDER_SITE_OTHER): Payer: Federal, State, Local not specified - PPO | Admitting: Neurology

## 2019-07-23 DIAGNOSIS — G4720 Circadian rhythm sleep disorder, unspecified type: Secondary | ICD-10-CM

## 2019-07-23 DIAGNOSIS — Z8249 Family history of ischemic heart disease and other diseases of the circulatory system: Secondary | ICD-10-CM

## 2019-07-23 DIAGNOSIS — G4733 Obstructive sleep apnea (adult) (pediatric): Secondary | ICD-10-CM

## 2019-07-23 DIAGNOSIS — E669 Obesity, unspecified: Secondary | ICD-10-CM

## 2019-07-23 DIAGNOSIS — R351 Nocturia: Secondary | ICD-10-CM

## 2019-07-23 DIAGNOSIS — E1165 Type 2 diabetes mellitus with hyperglycemia: Secondary | ICD-10-CM

## 2019-07-27 NOTE — Addendum Note (Signed)
Addended by: Star Age on: 07/27/2019 05:28 PM   Modules accepted: Orders

## 2019-07-27 NOTE — Progress Notes (Signed)
Patient referred by Dr. Ronnald Ramp, seen by me on 05/30/19, HST on 06/13/19, which showed moderate OSA, Patient had a CPAP titration study on 07/23/19.  Please call and inform patient that I have entered an order for treatment with positive airway pressure (PAP) treatment for obstructive sleep apnea (OSA). He did well during the latest sleep study with CPAP. We will, therefore, arrange for a machine for home use through a DME (durable medical equipment) company of His choice; and I will see the patient back in follow-up in about 10 weeks. Please also explain to the patient that I will be looking out for compliance data, which can be downloaded from the machine (stored on an SD card, that is inserted in the machine) or via remote access through a modem, that is built into the machine. At the time of the followup appointment we will discuss sleep study results and how it is going with PAP treatment at home. Please advise patient to bring His machine at the time of the first FU visit, even though this is cumbersome. Bringing the machine for every visit after that will likely not be needed, but often helps for the first visit to troubleshoot if needed. Please re-enforce the importance of compliance with treatment and the need for [place] to monitor compliance data - often an insurance requirement and actually good feedback for the patient as far as how they are doing.  Also remind patient, that any interim PAP machine or mask issues should be first addressed with the DME company, as they can often help better with technical and mask fit issues. Please ask if patient has a preference regarding DME company.  Please also make sure, the patient has a follow-up appointment with me in about 10 weeks from the setup date, thanks. May see one of our nurse practitioners if needed for proper timing of the FU appointment.  Please fax or rout report to the referring provider. Thanks,   Star Age, MD, PhD [facility]  [facility])

## 2019-07-27 NOTE — Procedures (Signed)
PATIENT'S NAME:  Tim Wilson, Tim Wilson DOB:      [birth year]      MR#:    [id number]     DATE OF RECORDING: 07/23/2019 REFERRING M.D.:  Scarlette Calico, MD Study Performed:   CPAP  Titration HISTORY:  53 year old right-handed man with a history of hypertension, hyperlipidemia, type 2 diabetes and borderline obesity, who returns for a full night titration study. He had a home sleep test on 06/13/19, which showed an AHI of 17.9 and low spo2 of 84%. The patient's weight 196 pounds with a height of 67 (inches), resulting in a BMI of 30.8 kg/m2. The patient's neck circumference measured 17.8 inches.  CURRENT MEDICATIONS: Aspirin, Trulicity, Tricor, Vascepa, Benicar, Crestor   PROCEDURE:  This is a multichannel digital polysomnogram utilizing the SomnoStar 11.2 system.  Electrodes and sensors were applied and monitored per AASM Specifications.   EEG, EOG, Chin and Limb EMG, were sampled at 200 Hz.  ECG, Snore and Nasal Pressure, Thermal Airflow, Respiratory Effort, CPAP Flow and Pressure, Oximetry was sampled at 50 Hz. Digital video and audio were recorded.      The patient was fitted with a SW N30 nasal cradle interface. CPAP was initiated at 5 cmH20 with heated humidity per AASM standards and pressure was advanced to 7 cm H20 because of hypopneas, apneas and desaturations.  At a PAP pressure of 7 cmH20, there was a reduction of the AHI to 0.3/hour with supine REM sleep achieved and O2 nadir of 91%.  Lights Out was at 22:57 and Lights On at 05:00. Total recording time (TRT) was 363.5 minutes, with a total sleep time (TST) of 287 minutes. The patient's sleep latency was 56 minutes, which is delayed. REM latency was 78.5 minutes, which is normal. The sleep efficiency was 79. %.    SLEEP ARCHITECTURE: WASO (Wake after sleep onset) was 26 minutes with mild sleep fragmentation noted. There were 19 minutes in Stage N1, 126.5 minutes Stage N2, 100.5 minutes Stage N3 and 41 minutes in Stage REM.  The percentage of Stage N1 was  6.6%, Stage N2 was 44.1%, Stage N3 was  35.%, which is increased, and Stage R (REM sleep) was 14.3%, which is reduced. The arousals were noted as: 37 were spontaneous, 4 were associated with PLMs, 2 were associated with respiratory events.  RESPIRATORY ANALYSIS:  There was a total of 2 respiratory events: 1 obstructive apneas, 0 central apneas and 0 mixed apneas with a total of 1 apneas and an apnea index (AI) of .2 /hour. There were 1 hypopneas with a hypopnea index of .2/hour. The patient also had 0 respiratory event related arousals (RERAs).      The total APNEA/HYPOPNEA INDEX  (AHI) was .4 /hour and the total RESPIRATORY DISTURBANCE INDEX was .4 /hour  0 events occurred in REM sleep and 2 events in NREM. The REM AHI was 0 /hour versus a non-REM AHI of .5 /hour.  The patient spent 34.5 minutes of total sleep time in the supine position and 253 minutes in non-supine. The supine AHI was 1.7, versus a non-supine AHI of 0.2.  OXYGEN SATURATION & C02:  The baseline 02 saturation was 96%, with the lowest being 88%. Time spent below 89% saturation equaled 0 minutes.  PERIODIC LIMB MOVEMENTS:  The patient had a total of 7 Periodic Limb Movements. The Periodic Limb Movement (PLM) index was 1.5 and the PLM Arousal index was .8 /hour.  Audio and video analysis did not show any abnormal or unusual movements, behaviors, phonations  or vocalizations. The patient took 1 bathroom break. The EKG was in keeping with normal sinus rhythm (NSR).  Post-study, the patient indicated that sleep was better than usual.   DIAGNOSIS 1. Obstructive Sleep Apnea  2. Dysfunctions associated with sleep stages or arousals from sleep  PLAN/RECOMMENDATIONS: 1. This study demonstrates resolution of the patient's obstructive sleep apnea with CPAP therapy. I will, therefore, start the patient on home CPAP treatment at a pressure of 7 cm via SW nasal cradle with heated humidity. The patient should be reminded to be fully compliant with  PAP therapy to improve sleep related symptoms and decrease long term cardiovascular risks. The patient should be reminded, that it may take up to 3 months to get fully used to using PAP with all planned sleep. The earlier full compliance is achieved, the better long term compliance tends to be. Please note that untreated obstructive sleep apnea may carry additional perioperative morbidity. Patients with significant obstructive sleep apnea should receive perioperative PAP therapy and the surgeons and particularly the anesthesiologist should be informed of the diagnosis and the severity of the sleep disordered breathing. 2. This study shows sleep fragmentation and abnormal sleep stage percentages; these are nonspecific findings and per se do not signify an intrinsic sleep disorder or a cause for the patient's sleep-related symptoms. Causes include (but are not limited to) the first night effect of the sleep study, circadian rhythm disturbances, medication effect or an underlying mood disorder or medical problem.  3. The patient should be cautioned not to drive, work at heights, or operate dangerous or heavy equipment when tired or sleepy. Review and reiteration of good sleep hygiene measures should be pursued with any patient. 4. The patient will be seen in follow-up in the sleep clinic at [facility] for discussion of the test results, symptom and treatment compliance review, further management strategies, etc. The referring provider will be notified of the test results.  I certify that I have reviewed the entire raw data recording prior to the issuance of this report in accordance with the Standards of Accreditation of the American Academy of Sleep Medicine (AASM)     Star Age, MD, PhD Diplomat, American Board of Neurology and Sleep Medicine (Neurology and Sleep Medicine)

## 2019-07-30 ENCOUNTER — Telehealth: Payer: Self-pay

## 2019-07-30 NOTE — Telephone Encounter (Signed)
-----   Message from Star Age, MD sent at 07/27/2019  5:28 PM EDT ----- Patient referred by Dr. Ronnald Ramp, seen by me on 05/30/19, HST on 06/13/19, which showed moderate OSA, Patient had a CPAP titration study on 07/23/19.  Please call and inform patient that I have entered an order for treatment with positive airway pressure (PAP) treatment for obstructive sleep apnea (OSA). He did well during the latest sleep study with CPAP. We will, therefore, arrange for a machine for home use through a DME (durable medical equipment) company of His choice; and I will see the patient back in follow-up in about 10 weeks. Please also explain to the patient that I will be looking out for compliance data, which can be downloaded from the machine (stored on an SD card, that is inserted in the machine) or via remote access through a modem, that is built into the machine. At the time of the followup appointment we will discuss sleep study results and how it is going with PAP treatment at home. Please advise patient to bring His machine at the time of the first FU visit, even though this is cumbersome. Bringing the machine for every visit after that will likely not be needed, but often helps for the first visit to troubleshoot if needed. Please re-enforce the importance of compliance with treatment and the need for [place] to monitor compliance data - often an insurance requirement and actually good feedback for the patient as far as how they are doing.  Also remind patient, that any interim PAP machine or mask issues should be first addressed with the DME company, as they can often help better with technical and mask fit issues. Please ask if patient has a preference regarding DME company.  Please also make sure, the patient has a follow-up appointment with me in about 10 weeks from the setup date, thanks. May see one of our nurse practitioners if needed for proper timing of the FU appointment.  Please fax or rout report to the referring  provider. Thanks,   Star Age, MD, PhD [facility])

## 2019-07-30 NOTE — Telephone Encounter (Signed)
I called pt to discuss his sleep study results. Phone just keeps ringing. Will try again later.

## 2019-07-30 NOTE — Telephone Encounter (Signed)
This encounter was created in error - please disregard.

## 2019-08-02 NOTE — Telephone Encounter (Signed)
I called pt. I advised pt that Dr. Rexene Alberts reviewed their sleep study results and found that pt has moderate sleep apnea best treated with CPAP at pressure 7cm. Dr. Rexene Alberts recommends that pt starts CPAP at pressure f 7 cm water pressure. I reviewed PAP compliance expectations with the pt. Pt is agreeable to starting a CPAP. I advised pt that an order will be sent to a DME, [facility], and [facility] will call the pt within about one week after they file with the pt's insurance. [facility] will show the pt how to use the machine, fit for masks, and troubleshoot the CPAP if needed. A follow up appt was made for insurance purposes with Dr. Rexene Alberts on Jan 11,2020 at 11:30 am. Pt verbalized understanding to arrive 15 minutes early and bring their CPAP. A letter with all of this information in it will be mailed to the pt as a reminder. I verified with the pt that the address we have on file is correct. Pt verbalized understanding of results. Pt had no questions at this time but was encouraged to call back if questions arise. I have sent the order to [facility] and have received confirmation that they have received the order.

## 2019-09-17 MED FILL — TOUJEO MAX SOLOSTAR 300 UNI: 300 | 22 days supply | Qty: 3 | Fill #3

## 2019-10-11 ENCOUNTER — Telehealth: Payer: Self-pay | Admitting: Neurology

## 2019-10-11 NOTE — Telephone Encounter (Signed)
"  Called to schedule on 08/08/2019, 08/10/2019, spoke with on 08/14/2019 and he said he would like to call insurance and call us back, and then called to follow up today 08/27/2019 no answer LVM. I am voiding this order, just wanted to make you aware."  Called the patient because he was scheduled Monday for initial cpap machine. He has not started due to waiting on insurance to change. I advised we will cancel the apt for Monday and he can call us once he gets scheduled with the machine and we will reschedule 31-90 days after starting. Pt verbalized understanding.

## 2019-10-15 ENCOUNTER — Ambulatory Visit: Payer: Self-pay | Admitting: Neurology

## 2019-10-16 ENCOUNTER — Other Ambulatory Visit: Payer: Self-pay | Admitting: Internal Medicine

## 2019-10-16 DIAGNOSIS — E781 Pure hyperglyceridemia: Secondary | ICD-10-CM

## 2019-10-16 MED FILL — ROSUVASTATIN CALCIUM 20 MG: 20 | 90 days supply | Qty: 90 | Fill #1

## 2019-10-16 MED FILL — FENOFIBRATE 145 MG TABS: 145 | 90 days supply | Qty: 90 | Fill #0

## 2019-10-16 MED FILL — OLMESARTAN MEDOXOMIL 20 MG: 20 | 90 days supply | Qty: 90 | Fill #1

## 2019-11-15 ENCOUNTER — Other Ambulatory Visit: Payer: Self-pay

## 2019-11-15 ENCOUNTER — Ambulatory Visit: Payer: Managed Care, Other (non HMO) | Admitting: Internal Medicine

## 2019-11-15 ENCOUNTER — Encounter: Payer: Self-pay | Admitting: Internal Medicine

## 2019-11-15 ENCOUNTER — Other Ambulatory Visit: Payer: Managed Care, Other (non HMO)

## 2019-11-15 VITALS — BP 120/76 | HR 76 | Temp 98.6°F | Ht 67.0 in | Wt 196.4 lb

## 2019-11-15 DIAGNOSIS — E559 Vitamin D deficiency, unspecified: Secondary | ICD-10-CM

## 2019-11-15 DIAGNOSIS — Z794 Long term (current) use of insulin: Secondary | ICD-10-CM

## 2019-11-15 DIAGNOSIS — E781 Pure hyperglyceridemia: Secondary | ICD-10-CM | POA: Diagnosis not present

## 2019-11-15 DIAGNOSIS — I10 Essential (primary) hypertension: Secondary | ICD-10-CM

## 2019-11-15 DIAGNOSIS — Z23 Encounter for immunization: Secondary | ICD-10-CM | POA: Diagnosis not present

## 2019-11-15 DIAGNOSIS — H9313 Tinnitus, bilateral: Secondary | ICD-10-CM | POA: Insufficient documentation

## 2019-11-15 DIAGNOSIS — E118 Type 2 diabetes mellitus with unspecified complications: Secondary | ICD-10-CM

## 2019-11-15 LAB — POCT GLYCOSYLATED HEMOGLOBIN (HGB A1C): Hemoglobin A1C: 12.7 % — AB (ref 4.0–5.6)

## 2019-11-15 LAB — BASIC METABOLIC PANEL
BUN: 18 mg/dL (ref 6–23)
CO2: 26 mEq/L (ref 19–32)
Calcium: 10.2 mg/dL (ref 8.4–10.5)
Chloride: 96 mEq/L (ref 96–112)
Creatinine, Ser: 1.28 mg/dL (ref 0.40–1.50)
GFR: 70.89 mL/min (ref >60.00)
Glucose, Bld: 344 mg/dL — ABNORMAL HIGH (ref 70–99)
Potassium: 4.6 mEq/L (ref 3.5–5.1)
Sodium: 131 mEq/L — ABNORMAL LOW (ref 135–145)

## 2019-11-15 LAB — VITAMIN D 25 HYDROXY (VIT D DEFICIENCY, FRACTURES): VITD: 13.14 ng/mL — ABNORMAL LOW (ref 30.00–100.00)

## 2019-11-15 LAB — TRIGLYCERIDES: Triglycerides: 2224 mg/dL — ABNORMAL HIGH (ref 0.0–149.0)

## 2019-11-15 MED ORDER — RYBELSUS 3 MG PO TABS: 1.0000 | ORAL_TABLET | Freq: Every day | ORAL | 0 refills | Status: DC

## 2019-11-15 MED ORDER — FENOFIBRATE 145 MG PO TABS: 145.0000 mg | ORAL_TABLET | Freq: Every day | ORAL | 0 refills | Status: DC

## 2019-11-15 MED ORDER — METFORMIN HCL ER (MOD) 1000 MG PO TB24: 1000.0000 mg | ORAL_TABLET | Freq: Every day | ORAL | 1 refills | Status: DC

## 2019-11-15 MED ORDER — TOUJEO MAX SOLOSTAR 300 UNIT/ML ~~LOC~~ SOPN: 60.0000 [IU] | PEN_INJECTOR | SUBCUTANEOUS | 11 refills | Status: DC

## 2019-11-15 MED ORDER — CHOLECALCIFEROL 1.25 MG (50000 UT) PO CAPS: 50000.0000 [IU] | ORAL_CAPSULE | ORAL | 0 refills | Status: DC

## 2019-11-15 MED ORDER — ICOSAPENT ETHYL 1 G PO CAPS: 2.0000 g | ORAL_CAPSULE | Freq: Two times a day (BID) | ORAL | 1 refills | Status: DC

## 2019-11-15 MED FILL — VIT D3-50 50,000 UNITS CAPS: 1.25 MG | 28 days supply | Qty: 4 | Fill #0

## 2019-11-15 MED FILL — TOUJEO MAX SOLOSTAR 300 UNI: 300 | 30 days supply | Qty: 6 | Fill #0

## 2019-11-15 MED FILL — FENOFIBRATE 145 MG TABS: 145 | 30 days supply | Qty: 30 | Fill #0

## 2019-11-15 NOTE — Patient Instructions (Signed)
Type 2 Diabetes Mellitus, Diagnosis, Adult Type 2 diabetes (type 2 diabetes mellitus) is a long-term (chronic) disease. In type 2 diabetes, one or both of these problems may be present:  The pancreas does not make enough of a hormone called insulin.  Cells in the body do not respond properly to insulin that the body makes (insulin resistance). Normally, insulin allows blood sugar (glucose) to enter cells in the body. The cells use glucose for energy. Insulin resistance or lack of insulin causes excess glucose to build up in the blood instead of going into cells. As a result, high blood glucose (hyperglycemia) develops. What increases the risk? The following factors may make you more likely to develop type 2 diabetes:  Having a family member with type 2 diabetes.  Being overweight or obese.  Having an inactive (sedentary) lifestyle.  Having been diagnosed with insulin resistance.  Having a history of prediabetes, gestational diabetes, or polycystic ovary syndrome (PCOS).  Being of American-Indian, African-American, Hispanic/Latino, or Asian/Pacific Islander descent. What are the signs or symptoms? In the early stage of this condition, you may not have symptoms. Symptoms develop slowly and may include:  Increased thirst (polydipsia).  Increased hunger(polyphagia).  Increased urination (polyuria).  Increased urination during the night (nocturia).  Unexplained weight loss.  Frequent infections that keep coming back (recurring).  Fatigue.  Weakness.  Vision changes, such as blurry vision.  Cuts or bruises that are slow to heal.  Tingling or numbness in the hands or feet.  Dark patches on the skin (acanthosis nigricans). How is this diagnosed? This condition is diagnosed based on your symptoms, your medical history, a physical exam, and your blood glucose level. Your blood glucose may be checked with one or more of the following blood tests:  A fasting blood glucose (FBG)  test. You will not be allowed to eat (you will fast) for 8 hours or longer before a blood sample is taken.  A random blood glucose test. This test checks blood glucose at any time of day regardless of when you ate.  An A1c (hemoglobin A1c) blood test. This test provides information about blood glucose control over the previous 2-3 months.  An oral glucose tolerance test (OGTT). This test measures your blood glucose at two times: ? After fasting. This is your baseline blood glucose level. ? Two hours after drinking a beverage that contains glucose. You may be diagnosed with type 2 diabetes if:  Your FBG level is 126 mg/dL (7.0 mmol/L) or higher.  Your random blood glucose level is 200 mg/dL (11.1 mmol/L) or higher.  Your A1c level is 6.5% or higher.  Your OGTT result is higher than 200 mg/dL (11.1 mmol/L). These blood tests may be repeated to confirm your diagnosis. How is this treated? Your treatment may be managed by a specialist called an endocrinologist. Type 2 diabetes may be treated by following instructions from your health care provider about:  Making diet and lifestyle changes. This may include: ? Following an individualized nutrition plan that is developed by a diet and nutrition specialist (registered dietitian). ? Exercising regularly. ? Finding ways to manage stress.  Checking your blood glucose level as often as told.  Taking diabetes medicines or insulin daily. This helps to keep your blood glucose levels in the healthy range. ? If you use insulin, you may need to adjust the dosage depending on how physically active you are and what foods you eat. Your health care provider will tell you how to adjust your dosage.    Taking medicines to help prevent complications from diabetes, such as: ? Aspirin. ? Medicine to lower cholesterol. ? Medicine to control blood pressure. Your health care provider will set individualized treatment goals for you. Your goals will be based on  your age, other medical conditions you have, and how you respond to diabetes treatment. Generally, the goal of treatment is to maintain the following blood glucose levels:  Before meals (preprandial): 80-130 mg/dL (4.4-7.2 mmol/L).  After meals (postprandial): below 180 mg/dL (10 mmol/L).  A1c level: less than 7%. Follow these instructions at home: Questions to ask your health care provider  Consider asking the following questions: ? Do I need to meet with a diabetes educator? ? Where can I find a support group for people with diabetes? ? What equipment will I need to manage my diabetes at home? ? What diabetes medicines do I need, and when should I take them? ? How often do I need to check my blood glucose? ? What number can I call if I have questions? ? When is my next appointment? General instructions  Take over-the-counter and prescription medicines only as told by your health care provider.  Keep all follow-up visits as told by your health care provider. This is important.  For more information about diabetes, visit: ? American Diabetes Association (ADA): www.diabetes.org ? American Association of Diabetes Educators (AADE): www.diabeteseducator.org Contact a health care provider if:  Your blood glucose is at or above 240 mg/dL (13.3 mmol/L) for 2 days in a row.  You have been sick or have had a fever for 2 days or longer, and you are not getting better.  You have any of the following problems for more than 6 hours: ? You cannot eat or drink. ? You have nausea and vomiting. ? You have diarrhea. Get help right away if:  Your blood glucose is lower than 54 mg/dL (3.0 mmol/L).  You become confused or you have trouble thinking clearly.  You have difficulty breathing.  You have moderate or large ketone levels in your urine. Summary  Type 2 diabetes (type 2 diabetes mellitus) is a long-term (chronic) disease. In type 2 diabetes, the pancreas does not make enough of a  hormone called insulin, or cells in the body do not respond properly to insulin that the body makes (insulin resistance).  This condition is treated by making diet and lifestyle changes and taking diabetes medicines or insulin.  Your health care provider will set individualized treatment goals for you. Your goals will be based on your age, other medical conditions you have, and how you respond to diabetes treatment.  Keep all follow-up visits as told by your health care provider. This is important. This information is not intended to replace advice given to you by your health care provider. Make sure you discuss any questions you have with your health care provider. Document Revised: 11/18/2017 Document Reviewed: 10/24/2015 Elsevier Patient Education  2020 [employer]  

## 2019-11-15 NOTE — Progress Notes (Signed)
Subjective:  Patient ID: Tim Wilson, male    DOB: [birth year]  Age: 54 y.o. MRN: [id number]  CC: Hyperlipidemia and Diabetes  This visit occurred during the SARS-CoV-2 public health emergency.  Safety protocols were in place, including screening questions prior to the visit, additional usage of staff PPE, and extensive cleaning of exam room while observing appropriate contact time as indicated for disinfecting solutions.    HPI Tim Wilson presents for f/up - He complains of recurrent episodes of ringing in his ears over the last 2 years.  He tells me he previously saw an audiologist and an ENT doctor and had some cerumen removed which helped some but the tinnitus continues.  He plays basketball and runs quite a bit and denies any episodes of chest pain, shortness of breath, palpitations, edema, or fatigue.  He does not monitor his blood pressure or his blood sugars.  He complains of polys.  He tells me he only gives himself 12 units of insulin a day.  He will not take Metformin because it previously caused diarrhea.  He is not using the GLP-1 agonist because it was too expensive.  Outpatient Medications Prior to Visit  Medication Sig Dispense Refill  . aspirin EC 81 MG tablet Take 1 tablet (81 mg total) by mouth daily. 90 tablet 1  . glucose blood (ONETOUCH VERIO) test strip 1 each by Other route 2 (two) times daily. And lancets 2/day 100 each 12  . olmesartan (BENICAR) 20 MG tablet Take 1 tablet (20 mg total) by mouth daily. 90 tablet 1  . rosuvastatin (CRESTOR) 20 MG tablet Take 1 tablet (20 mg total) by mouth daily. 90 tablet 1  . fenofibrate (TRICOR) 145 MG tablet TAKE 1 TABLET (145 MG TOTAL) BY MOUTH DAILY. 90 tablet 0  . Icosapent Ethyl (VASCEPA) 1 g CAPS Take 2 capsules (2 g total) by mouth 2 (two) times daily. 360 capsule 1  . Insulin Glargine, 2 Unit Dial, (TOUJEO MAX SOLOSTAR) 300 UNIT/ML SOPN Inject 40 Units into the skin every morning. And lancets 2/day 5 pen 11  .  Cholecalciferol 1.25 MG (50000 UT) capsule Take 1 capsule (50,000 Units total) by mouth once a week. (Patient not taking: Reported on 11/15/2019) 12 capsule 1  . Dulaglutide (TRULICITY) [id number] SOPN Inject 1 Act into the skin once a week. (Patient not taking: Reported on 11/15/2019) 6 pen 0   No facility-administered medications prior to visit.    ROS Review of Systems  Constitutional: Negative for appetite change, diaphoresis and fatigue.  HENT: Positive for tinnitus. Negative for ear discharge, ear pain and sinus pressure.   Eyes: Negative.  Negative for visual disturbance.  Respiratory: Negative for cough, chest tightness, shortness of breath and wheezing.   Cardiovascular: Negative for chest pain, palpitations and leg swelling.  Gastrointestinal: Negative for abdominal pain, constipation, diarrhea, nausea and vomiting.  Endocrine: Positive for polydipsia and polyuria. Negative for polyphagia.  Genitourinary: Negative.  Negative for difficulty urinating, dysuria and hematuria.  Musculoskeletal: Negative.  Negative for arthralgias and myalgias.  Skin: Negative.  Negative for color change and pallor.  Neurological: Negative for dizziness, weakness and light-headedness.  Hematological: Negative for adenopathy. Does not bruise/bleed easily.  Psychiatric/Behavioral: Negative.     Objective:  BP 120/76 (BP Location: Left Arm, Patient Position: Sitting, Cuff Size: Large)   Pulse 76   Temp 98.6 F (37 C) (Oral)   Ht 5\' 7"  (1.702 m)   Wt 196 lb 6 oz (89.1 kg)  SpO2 97%   BMI 30.76 kg/m   BP Readings from Last 3 Encounters:  11/15/19 120/76  05/30/19 130/85  05/15/19 (!) 160/98    Wt Readings from Last 3 Encounters:  11/15/19 196 lb 6 oz (89.1 kg)  05/30/19 197 lb (89.4 kg)  05/15/19 199 lb (90.3 kg)    Physical Exam Vitals reviewed.  Constitutional:      Appearance: Normal appearance.  HENT:     Right Ear: Hearing, tympanic membrane, ear canal and external ear  normal. There is no impacted cerumen.     Left Ear: Hearing, tympanic membrane, ear canal and external ear normal. There is no impacted cerumen.     Mouth/Throat:     Mouth: Mucous membranes are moist.  Eyes:     General: No scleral icterus.    Conjunctiva/sclera: Conjunctivae normal.  Cardiovascular:     Rate and Rhythm: Normal rate and regular rhythm.     Heart sounds: No murmur.  Pulmonary:     Effort: Pulmonary effort is normal.     Breath sounds: No stridor. No wheezing, rhonchi or rales.  Abdominal:     General: Abdomen is flat. Bowel sounds are normal. There is no distension.     Palpations: Abdomen is soft. There is no hepatomegaly, splenomegaly or mass.     Tenderness: There is no abdominal tenderness.  Musculoskeletal:        General: Normal range of motion.     Cervical back: Neck supple.     Right lower leg: No edema.     Left lower leg: No edema.  Lymphadenopathy:     Cervical: No cervical adenopathy.  Skin:    General: Skin is warm and dry.     Coloration: Skin is not pale.  Neurological:     General: No focal deficit present.     Mental Status: He is alert.  Psychiatric:        Mood and Affect: Mood normal.        Behavior: Behavior normal.     Lab Results  Component Value Date   WBC 5.1 05/15/2019   HGB 14.3 05/15/2019   HCT 42.4 05/15/2019   PLT 279.0 05/15/2019   GLUCOSE 344 (H) 11/15/2019   CHOL 236 (H) 05/15/2019   TRIG (H) 11/15/2019    2224.0 Triglyceride is over 400; calculations on Lipids are invalid.   HDL 28.20 (L) 05/15/2019   LDLDIRECT 49.0 05/15/2019   LDLCALC NOT CALC 09/21/2016   ALT 17 04/13/2018   AST 12 04/13/2018   NA 131 (L) 11/15/2019   K 4.6 11/15/2019   CL 96 11/15/2019   CREATININE 1.28 11/15/2019   BUN 18 11/15/2019   CO2 26 11/15/2019   TSH 1.26 05/15/2019   PSA 0.50 05/15/2019   HGBA1C 12.7 (A) 11/15/2019   MICROALBUR 62.0 (H) 05/15/2019    No results found.  Assessment & Plan:   Tim Wilson was seen today for  hyperlipidemia and diabetes.  Diagnoses and all orders for this visit:  Essential hypertension- His blood pressure is adequately well controlled.  He has dilutional hyponatremia but his other electrolytes and his renal function are normal. -     Basic metabolic panel; Future -     Cancel: Hemoglobin A1c; Future  Type 2 diabetes mellitus with complication, with long-term current use of insulin ([place])- His A1c is elevated at 12.7%.  He has glucose toxicity with dilutional hyponatremia and a blood sugar of 344.  I have asked him to increase  his basal insulin dose to at least 60 units a day.  I have encouraged him to start an oral GLP1 agonist and gave him samples of Rybelsus.  I encouraged him to see his endocrinologist and to undergo lifestyle modifications.  He was also referred for diabetic education. -     Cancel: Hemoglobin A1c; Future -     POCT glycosylated hemoglobin (Hb A1C) -     Semaglutide (RYBELSUS) 3 MG TABS; Take 1 tablet by mouth daily. -     Ambulatory referral to Endocrinology -     Amb Referral to Nutrition and Diabetic E -     Discontinue: metFORMIN (GLUMETZA) 1000 MG (MOD) 24 hr tablet; Take 1 tablet (1,000 mg total) by mouth daily with breakfast. -     Insulin Glargine, 2 Unit Dial, (TOUJEO MAX SOLOSTAR) 300 UNIT/ML SOPN; Inject 60 Units into the skin every morning. And lancets 2/day  HYPERTRIGLYCERIDEMIA- His triglycerides are dangerously elevated at 2224.  He was encouraged to let me know if he develops any abdominal pain.  He was educated regarding dietary changes.  I have asked him to be compliant with the fenofibrate and icosapent ethyl.  I have also asked him to increase his insulin dosage.  Additionally, he has been referred for nutritional counseling. -     Triglycerides; Future -     Insulin Glargine, 2 Unit Dial, (TOUJEO MAX SOLOSTAR) 300 UNIT/ML SOPN; Inject 60 Units into the skin every morning. And lancets 2/day -     fenofibrate (TRICOR) 145 MG tablet; Take 1 tablet  (145 mg total) by mouth daily. -     icosapent Ethyl (VASCEPA) 1 g capsule; Take 2 capsules (2 g total) by mouth 2 (two) times daily.  Vitamin D deficiency disease -     VITAMIN D 25 Hydroxy (Vit-D Deficiency, Fractures); Future -     Cholecalciferol 1.25 MG (50000 UT) capsule; Take 1 capsule (50,000 Units total) by mouth once a week.  Tinnitus aurium, bilateral -     Ambulatory referral to ENT  Need for influenza vaccination -     Flu Vaccine QUAD 36+ mos IM  Need for pneumococcal vaccination -     Pneumococcal polysaccharide vaccine 23-valent greater than or equal to 2yo subcutaneous/IM   I have discontinued Antionette Fairy. Shareef's Trulicity and metFORMIN. I have also changed his Insulin Glargine (2 Unit Dial) to [employer] and Vascepa to icosapent Ethyl. Additionally, I am having him start on Rybelsus. Lastly, I am having him maintain his glucose blood, rosuvastatin, olmesartan, aspirin EC, fenofibrate, and Cholecalciferol.  Meds ordered this encounter  Medications  . Semaglutide (RYBELSUS) 3 MG TABS    Sig: Take 1 tablet by mouth daily.    Dispense:  30 tablet    Refill:  0  . DISCONTD: metFORMIN (GLUMETZA) 1000 MG (MOD) 24 hr tablet    Sig: Take 1 tablet (1,000 mg total) by mouth daily with breakfast.    Dispense:  90 tablet    Refill:  1  . Insulin Glargine, 2 Unit Dial, (TOUJEO MAX SOLOSTAR) 300 UNIT/ML SOPN    Sig: Inject 60 Units into the skin every morning. And lancets 2/day    Dispense:  5 pen    Refill:  11  . fenofibrate (TRICOR) 145 MG tablet    Sig: Take 1 tablet (145 mg total) by mouth daily.    Dispense:  90 tablet    Refill:  0  . icosapent Ethyl (VASCEPA) 1 g capsule  Sig: Take 2 capsules (2 g total) by mouth 2 (two) times daily.    Dispense:  360 capsule    Refill:  1  . Cholecalciferol 1.25 MG (50000 UT) capsule    Sig: Take 1 capsule (50,000 Units total) by mouth once a week.    Dispense:  12 capsule    Refill:  0     Follow-up: Return in  about 4 weeks (around 12/13/2019).  Scarlette Calico, MD

## 2019-11-16 MED FILL — VASCEPA 1 GM CAPSULE: 1 | 30 days supply | Qty: 120 | Fill #0

## 2019-11-20 ENCOUNTER — Telehealth: Payer: Self-pay

## 2019-11-20 NOTE — Telephone Encounter (Signed)
New message    The patient has another question for the nurse asking for a call back.

## 2019-11-21 NOTE — Telephone Encounter (Signed)
lvm for pt to call back.   RE: if I am not available when/if he calls back - please ask if there are any details or information that I need to gather prior to reaching back out to him.

## 2019-11-21 NOTE — Telephone Encounter (Signed)
Patient called back, His question was if the referrals had been placed at his LOV. I have informed patient that these referrals had been placed, He should get a call from those doctor's office to set up an appointment. Patient understood and does not need a call back from the nurse.

## 2019-11-28 MED FILL — TOUJEO MAX SOLOSTAR 300 UNI: 300 | 30 days supply | Qty: 6 | Fill #0

## 2019-12-17 ENCOUNTER — Other Ambulatory Visit: Payer: Self-pay | Admitting: Internal Medicine

## 2019-12-17 ENCOUNTER — Telehealth: Payer: Self-pay

## 2019-12-17 DIAGNOSIS — E118 Type 2 diabetes mellitus with unspecified complications: Secondary | ICD-10-CM

## 2019-12-17 MED ORDER — RYBELSUS 7 MG PO TABS: 1.0000 | ORAL_TABLET | Freq: Every day | ORAL | 0 refills | Status: DC

## 2019-12-17 MED FILL — RYBELSUS 7 MG TABS: 7 | 30 days supply | Qty: 30 | Fill #0

## 2019-12-17 NOTE — Telephone Encounter (Signed)
   Was told the prescription would be 7 mg after the  3 mg would be finished  1.Medication Requested:Semaglutide (RYBELSUS) 3 MG TABS  2. Pharmacy (Name, [place], City):[facility], [place] - [place]  3. On Med List: Yes  4. Last Visit with PCP: 2.11.21   5. Next visit date with PCP: no appt made at this time   Agent: Please be advised that RX refills may take up to 3 business days. We ask that you follow-up with your pharmacy.

## 2019-12-17 NOTE — Telephone Encounter (Signed)
Refill request for the 7mg  Rybelsus.   Please advise.

## 2019-12-17 NOTE — Telephone Encounter (Signed)
PA completed via cover my meds. Key: [id number]  PA has been approved.

## 2019-12-18 ENCOUNTER — Encounter: Payer: Self-pay | Admitting: Internal Medicine

## 2019-12-18 NOTE — Telephone Encounter (Signed)
LVM for pt informing PA has been approved and he can to pharmacy and pick up. He may want to call them first to make sure they have it ready for pick up.

## 2019-12-28 MED FILL — VASCEPA 1 GM CAPSULE: 1 | 30 days supply | Qty: 120 | Fill #0

## 2019-12-28 MED FILL — VIT D3-50 50,000 UNITS CAPS: 1.25 MG | 28 days supply | Qty: 4 | Fill #0

## 2019-12-28 MED FILL — TOUJEO MAX SOLOSTAR 300 UNI: 300 | 30 days supply | Qty: 6 | Fill #1

## 2019-12-28 MED FILL — FENOFIBRATE 145 MG TABS: 145 | 30 days supply | Qty: 30 | Fill #0

## 2020-01-17 MED FILL — RYBELSUS 7 MG TABS: 7 | 30 days supply | Qty: 30 | Fill #1

## 2020-01-28 DIAGNOSIS — H9319 Tinnitus, unspecified ear: Secondary | ICD-10-CM | POA: Insufficient documentation

## 2020-01-28 DIAGNOSIS — H903 Sensorineural hearing loss, bilateral: Secondary | ICD-10-CM | POA: Insufficient documentation

## 2020-02-18 MED FILL — FENOFIBRATE 145 MG TABS: 145 | 30 days supply | Qty: 30 | Fill #0

## 2020-02-18 MED FILL — VIT D3-50 50,000 UNITS CAPS: 1.25 MG | 28 days supply | Qty: 4 | Fill #0

## 2020-03-13 ENCOUNTER — Other Ambulatory Visit: Payer: Self-pay | Admitting: Endocrinology

## 2020-03-19 ENCOUNTER — Telehealth: Payer: Self-pay

## 2020-03-19 NOTE — Telephone Encounter (Signed)
Pt is past due for a follow up appointment to have his A1c done since starting the new medication.

## 2020-03-19 NOTE — Telephone Encounter (Signed)
1.Medication Requested:Semaglutide (RYBELSUS) 7 MG TABS  2. Pharmacy (Name, [address], [place]): [facility] on [address] and [address]   3. On Med List: Yes   4. Last Visit with PCP: 2.11.21   5. Next visit date with PCP: n/a   Agent: Please be advised that RX refills may take up to 3 business days. We ask that you follow-up with your pharmacy.

## 2020-03-20 NOTE — Telephone Encounter (Signed)
LVM for patient to return call and schedule appointment.  

## 2020-03-21 NOTE — Telephone Encounter (Signed)
LVM for pt to scheduled an appointment.

## 2020-03-25 ENCOUNTER — Other Ambulatory Visit: Payer: Self-pay

## 2020-03-25 ENCOUNTER — Encounter: Payer: Self-pay | Admitting: Internal Medicine

## 2020-03-25 ENCOUNTER — Telehealth: Payer: Self-pay | Admitting: Internal Medicine

## 2020-03-25 ENCOUNTER — Ambulatory Visit: Payer: Managed Care, Other (non HMO) | Admitting: Internal Medicine

## 2020-03-25 VITALS — BP 124/74 | HR 74 | Temp 98.4°F | Ht 67.0 in | Wt 202.0 lb

## 2020-03-25 DIAGNOSIS — E785 Hyperlipidemia, unspecified: Secondary | ICD-10-CM | POA: Diagnosis not present

## 2020-03-25 DIAGNOSIS — I10 Essential (primary) hypertension: Secondary | ICD-10-CM

## 2020-03-25 DIAGNOSIS — Z794 Long term (current) use of insulin: Secondary | ICD-10-CM | POA: Diagnosis not present

## 2020-03-25 DIAGNOSIS — E781 Pure hyperglyceridemia: Secondary | ICD-10-CM

## 2020-03-25 DIAGNOSIS — E118 Type 2 diabetes mellitus with unspecified complications: Secondary | ICD-10-CM | POA: Diagnosis not present

## 2020-03-25 DIAGNOSIS — E559 Vitamin D deficiency, unspecified: Secondary | ICD-10-CM

## 2020-03-25 DIAGNOSIS — Z23 Encounter for immunization: Secondary | ICD-10-CM

## 2020-03-25 LAB — BASIC METABOLIC PANEL
BUN: 12 mg/dL (ref 6–23)
CO2: 28 mEq/L (ref 19–32)
Calcium: 10.1 mg/dL (ref 8.4–10.5)
Chloride: 98 mEq/L (ref 96–112)
Creatinine, Ser: 1.07 mg/dL (ref 0.40–1.50)
GFR: 87.06 mL/min (ref >60.00)
Glucose, Bld: 336 mg/dL — ABNORMAL HIGH (ref 70–99)
Potassium: 3.9 mEq/L (ref 3.5–5.1)
Sodium: 135 mEq/L (ref 135–145)

## 2020-03-25 LAB — TRIGLYCERIDES: Triglycerides: 1230 mg/dL — ABNORMAL HIGH (ref 0.0–149.0)

## 2020-03-25 LAB — VITAMIN D 25 HYDROXY (VIT D DEFICIENCY, FRACTURES): VITD: 28.37 ng/mL — ABNORMAL LOW (ref 30.00–100.00)

## 2020-03-25 LAB — HEMOGLOBIN A1C: Hgb A1c MFr Bld: 11.8 % — ABNORMAL HIGH (ref 4.6–6.5)

## 2020-03-25 MED ORDER — RYBELSUS 7 MG PO TABS: 1.0000 | ORAL_TABLET | Freq: Every day | ORAL | 0 refills | Status: DC

## 2020-03-25 MED ORDER — TOUJEO MAX SOLOSTAR 300 UNIT/ML ~~LOC~~ SOPN: 100.0000 [IU] | PEN_INJECTOR | SUBCUTANEOUS | 11 refills | Status: DC

## 2020-03-25 MED ORDER — CHOLECALCIFEROL 1.25 MG (50000 UT) PO CAPS: 50000.0000 [IU] | ORAL_CAPSULE | ORAL | 0 refills | Status: DC

## 2020-03-25 MED FILL — VIT D3-50 50,000 UNITS CAPS: 1.25 MG | 28 days supply | Qty: 4 | Fill #0

## 2020-03-25 NOTE — Telephone Encounter (Signed)
Record has been updated.

## 2020-03-25 NOTE — Telephone Encounter (Signed)
    Patient calling to report he had covid vaccine on 03/11 and 04/1

## 2020-03-25 NOTE — Patient Instructions (Signed)
Type 2 Diabetes Mellitus, Diagnosis, Adult Type 2 diabetes (type 2 diabetes mellitus) is a long-term (chronic) disease. In type 2 diabetes, one or both of these problems may be present:  The pancreas does not make enough of a hormone called insulin.  Cells in the body do not respond properly to insulin that the body makes (insulin resistance). Normally, insulin allows blood sugar (glucose) to enter cells in the body. The cells use glucose for energy. Insulin resistance or lack of insulin causes excess glucose to build up in the blood instead of going into cells. As a result, high blood glucose (hyperglycemia) develops. What increases the risk? The following factors may make you more likely to develop type 2 diabetes:  Having a family member with type 2 diabetes.  Being overweight or obese.  Having an inactive (sedentary) lifestyle.  Having been diagnosed with insulin resistance.  Having a history of prediabetes, gestational diabetes, or polycystic ovary syndrome (PCOS).  Being of American-Indian, African-American, Hispanic/Latino, or Asian/Pacific Islander descent. What are the signs or symptoms? In the early stage of this condition, you may not have symptoms. Symptoms develop slowly and may include:  Increased thirst (polydipsia).  Increased hunger(polyphagia).  Increased urination (polyuria).  Increased urination during the night (nocturia).  Unexplained weight loss.  Frequent infections that keep coming back (recurring).  Fatigue.  Weakness.  Vision changes, such as blurry vision.  Cuts or bruises that are slow to heal.  Tingling or numbness in the hands or feet.  Dark patches on the skin (acanthosis nigricans). How is this diagnosed? This condition is diagnosed based on your symptoms, your medical history, a physical exam, and your blood glucose level. Your blood glucose may be checked with one or more of the following blood tests:  A fasting blood glucose (FBG)  test. You will not be allowed to eat (you will fast) for 8 hours or longer before a blood sample is taken.  A random blood glucose test. This test checks blood glucose at any time of day regardless of when you ate.  An A1c (hemoglobin A1c) blood test. This test provides information about blood glucose control over the previous 2-3 months.  An oral glucose tolerance test (OGTT). This test measures your blood glucose at two times: ? After fasting. This is your baseline blood glucose level. ? Two hours after drinking a beverage that contains glucose. You may be diagnosed with type 2 diabetes if:  Your FBG level is 126 mg/dL (7.0 mmol/L) or higher.  Your random blood glucose level is 200 mg/dL (11.1 mmol/L) or higher.  Your A1c level is 6.5% or higher.  Your OGTT result is higher than 200 mg/dL (11.1 mmol/L). These blood tests may be repeated to confirm your diagnosis. How is this treated? Your treatment may be managed by a specialist called an endocrinologist. Type 2 diabetes may be treated by following instructions from your health care provider about:  Making diet and lifestyle changes. This may include: ? Following an individualized nutrition plan that is developed by a diet and nutrition specialist (registered dietitian). ? Exercising regularly. ? Finding ways to manage stress.  Checking your blood glucose level as often as told.  Taking diabetes medicines or insulin daily. This helps to keep your blood glucose levels in the healthy range. ? If you use insulin, you may need to adjust the dosage depending on how physically active you are and what foods you eat. Your health care provider will tell you how to adjust your dosage.    Taking medicines to help prevent complications from diabetes, such as: ? Aspirin. ? Medicine to lower cholesterol. ? Medicine to control blood pressure. Your health care provider will set individualized treatment goals for you. Your goals will be based on  your age, other medical conditions you have, and how you respond to diabetes treatment. Generally, the goal of treatment is to maintain the following blood glucose levels:  Before meals (preprandial): 80-130 mg/dL (4.4-7.2 mmol/L).  After meals (postprandial): below 180 mg/dL (10 mmol/L).  A1c level: less than 7%. Follow these instructions at home: Questions to ask your health care provider  Consider asking the following questions: ? Do I need to meet with a diabetes educator? ? Where can I find a support group for people with diabetes? ? What equipment will I need to manage my diabetes at home? ? What diabetes medicines do I need, and when should I take them? ? How often do I need to check my blood glucose? ? What number can I call if I have questions? ? When is my next appointment? General instructions  Take over-the-counter and prescription medicines only as told by your health care provider.  Keep all follow-up visits as told by your health care provider. This is important.  For more information about diabetes, visit: ? American Diabetes Association (ADA): www.diabetes.org ? American Association of Diabetes Educators (AADE): www.diabeteseducator.org Contact a health care provider if:  Your blood glucose is at or above 240 mg/dL (13.3 mmol/L) for 2 days in a row.  You have been sick or have had a fever for 2 days or longer, and you are not getting better.  You have any of the following problems for more than 6 hours: ? You cannot eat or drink. ? You have nausea and vomiting. ? You have diarrhea. Get help right away if:  Your blood glucose is lower than 54 mg/dL (3.0 mmol/L).  You become confused or you have trouble thinking clearly.  You have difficulty breathing.  You have moderate or large ketone levels in your urine. Summary  Type 2 diabetes (type 2 diabetes mellitus) is a long-term (chronic) disease. In type 2 diabetes, the pancreas does not make enough of a  hormone called insulin, or cells in the body do not respond properly to insulin that the body makes (insulin resistance).  This condition is treated by making diet and lifestyle changes and taking diabetes medicines or insulin.  Your health care provider will set individualized treatment goals for you. Your goals will be based on your age, other medical conditions you have, and how you respond to diabetes treatment.  Keep all follow-up visits as told by your health care provider. This is important. This information is not intended to replace advice given to you by your health care provider. Make sure you discuss any questions you have with your health care provider. Document Revised: 11/18/2017 Document Reviewed: 10/24/2015 Elsevier Patient Education  2020 [employer]  

## 2020-03-25 NOTE — Progress Notes (Signed)
Subjective:  Patient ID: Tim Wilson, male    DOB: 07-24-1966  Age: 54 y.o. MRN: [id number]  CC: Diabetes and Hyperlipidemia  This visit occurred during the SARS-CoV-2 public health emergency.  Safety protocols were in place, including screening questions prior to the visit, additional usage of staff PPE, and extensive cleaning of exam room while observing appropriate contact time as indicated for disinfecting solutions.    HPI Tim Wilson presents for f/up - He continues to complain of polys.  He is not taking Rybelsus because it was too expensive.  He tells me he is compliant with the basal insulin.  He has gained weight since I last saw him.  He denies any recent episodes of abdominal pain.  Outpatient Medications Prior to Visit  Medication Sig Dispense Refill  . aspirin EC 81 MG tablet Take 1 tablet (81 mg total) by mouth daily. 90 tablet 1  . fenofibrate (TRICOR) 145 MG tablet Take 1 tablet (145 mg total) by mouth daily. 90 tablet 0  . glucose blood (ONETOUCH VERIO) test strip 1 each by Other route 2 (two) times daily. And lancets 2/day 100 each 12  . icosapent Ethyl (VASCEPA) 1 g capsule Take 2 capsules (2 g total) by mouth 2 (two) times daily. 360 capsule 1  . olmesartan (BENICAR) 20 MG tablet Take 1 tablet (20 mg total) by mouth daily. 90 tablet 1  . rosuvastatin (CRESTOR) 20 MG tablet Take 1 tablet (20 mg total) by mouth daily. 90 tablet 1  . Cholecalciferol 1.25 MG (50000 UT) capsule Take 1 capsule (50,000 Units total) by mouth once a week. 12 capsule 0  . Insulin Glargine, 2 Unit Dial, (TOUJEO MAX SOLOSTAR) 300 UNIT/ML SOPN Inject 60 Units into the skin every morning. And lancets 2/day 5 pen 11  . Semaglutide (RYBELSUS) 7 MG TABS Take 1 tablet by mouth daily. 90 tablet 0   No facility-administered medications prior to visit.    ROS Review of Systems  Constitutional: Positive for unexpected weight change. Negative for appetite change, diaphoresis and fatigue.  HENT:  Negative.   Eyes: Negative for visual disturbance.  Respiratory: Negative for cough, chest tightness, shortness of breath and wheezing.   Cardiovascular: Negative for chest pain, palpitations and leg swelling.  Gastrointestinal: Negative for abdominal pain, constipation, diarrhea, nausea and vomiting.  Endocrine: Positive for polydipsia, polyphagia and polyuria.  Genitourinary: Negative.  Negative for difficulty urinating.  Musculoskeletal: Negative for arthralgias, back pain, myalgias and neck pain.  Skin: Negative.  Negative for color change and pallor.  Neurological: Negative for dizziness, weakness and headaches.  Hematological: Negative for adenopathy. Does not bruise/bleed easily.  Psychiatric/Behavioral: Negative.     Objective:  BP 124/74 (BP Location: Left Arm, Patient Position: Sitting, Cuff Size: Large)   Pulse 74   Temp 98.4 F (36.9 C) (Oral)   Ht 5\' 7"  (1.702 m)   Wt 202 lb (91.6 kg)   SpO2 95%   BMI 31.64 kg/m   BP Readings from Last 3 Encounters:  03/25/20 124/74  11/15/19 120/76  05/30/19 130/85    Wt Readings from Last 3 Encounters:  03/25/20 202 lb (91.6 kg)  11/15/19 196 lb 6 oz (89.1 kg)  05/30/19 197 lb (89.4 kg)    Physical Exam Vitals reviewed.  Constitutional:      Appearance: Normal appearance. He is not ill-appearing.  HENT:     Nose: Nose normal.     Mouth/Throat:     Mouth: Mucous membranes are moist.  Eyes:     General: No scleral icterus.    Conjunctiva/sclera: Conjunctivae normal.  Cardiovascular:     Rate and Rhythm: Normal rate and regular rhythm.     Heart sounds: No murmur heard.   Pulmonary:     Effort: Pulmonary effort is normal.     Breath sounds: No stridor. No wheezing, rhonchi or rales.  Abdominal:     General: Abdomen is flat. Bowel sounds are normal. There is no distension.     Palpations: Abdomen is soft. There is no hepatomegaly, splenomegaly or mass.     Tenderness: There is no abdominal tenderness.    Musculoskeletal:        General: Normal range of motion.     Cervical back: Neck supple.     Right lower leg: No edema.     Left lower leg: No edema.  Lymphadenopathy:     Cervical: No cervical adenopathy.  Skin:    General: Skin is warm and dry.  Neurological:     General: No focal deficit present.     Mental Status: He is alert.     Lab Results  Component Value Date   WBC 5.1 05/15/2019   HGB 14.3 05/15/2019   HCT 42.4 05/15/2019   PLT 279.0 05/15/2019   GLUCOSE 336 (H) 03/25/2020   CHOL 236 (H) 05/15/2019   TRIG (H) 03/25/2020    1230.0 Triglyceride is over 400; calculations on Lipids are invalid.   HDL 28.20 (L) 05/15/2019   LDLDIRECT 49.0 05/15/2019   LDLCALC NOT CALC 09/21/2016   ALT 17 04/13/2018   AST 12 04/13/2018   NA 135 03/25/2020   K 3.9 03/25/2020   CL 98 03/25/2020   CREATININE 1.07 03/25/2020   BUN 12 03/25/2020   CO2 28 03/25/2020   TSH 1.26 05/15/2019   PSA 0.50 05/15/2019   HGBA1C 11.8 (H) 03/25/2020   MICROALBUR 62.0 (H) 05/15/2019    No results found.  Assessment & Plan:   Tim Wilson was seen today for diabetes and hyperlipidemia.  Diagnoses and all orders for this visit:  Hyperlipidemia with target LDL less than 100- He is tolerating the statin well.  Essential hypertension- His blood pressure is adequately well controlled.  Electrolytes and renal function are normal. -     Basic metabolic panel; Future -     Basic metabolic panel  Vitamin D deficiency disease- His vitamin D level remains low.  I have asked him to be more compliant with the vitamin D supplement. -     VITAMIN D 25 Hydroxy (Vit-D Deficiency, Fractures); Future -     VITAMIN D 25 Hydroxy (Vit-D Deficiency, Fractures) -     Cholecalciferol 1.25 MG (50000 UT) capsule; Take 1 capsule (50,000 Units total) by mouth once a week.  HYPERTRIGLYCERIDEMIA- His triglycerides have not improved much and he is at risk of pancreatitis due to hypertriglyceridemia.  I recommended that he be  compliant with icosapent ethyl.  I also think he should increase the dose of the basal insulin and improve his lifestyle modifications.  Additionally, I have asked him to see a diabetic educator and endocrinology. -     Triglycerides; Future -     Triglycerides -     Amb Referral to Nutrition and Diabetic E -     Ambulatory referral to Endocrinology -     insulin glargine, 2 Unit Dial, (TOUJEO MAX SOLOSTAR) 300 UNIT/ML Solostar Pen; Inject 100 Units into the skin every morning. And lancets 2/day  Type  2 diabetes mellitus with complication, with long-term current use of insulin ([place])- His A1c remains too high at 11.8%.  I recommended that he restart Rybelsus and to use a co-pay card to help get the price reduced.  I have also asked him to see diabetic education and endocrinology.  Additionally I think he should increase the dose of his basal insulin. -     Hemoglobin A1c; Future -     Basic metabolic panel; Future -     Basic metabolic panel -     Hemoglobin A1c -     Amb Referral to Nutrition and Diabetic E -     Ambulatory referral to Endocrinology -     insulin glargine, 2 Unit Dial, (TOUJEO MAX SOLOSTAR) 300 UNIT/ML Solostar Pen; Inject 100 Units into the skin every morning. And lancets 2/day  Need for Tdap vaccination -     Tdap vaccine greater than or equal to 7yo IM  Type II diabetes mellitus with manifestations ([facility]) -     Semaglutide (RYBELSUS) 7 MG TABS; Take 1 tablet by mouth daily. -     Ambulatory referral to Ophthalmology   I have changed Tim Wilson. Zabawa's Toujeo Max SoloStar. I am also having him maintain his glucose blood, rosuvastatin, olmesartan, aspirin EC, fenofibrate, icosapent Ethyl, Rybelsus, and Cholecalciferol.  Meds ordered this encounter  Medications  . Semaglutide (RYBELSUS) 7 MG TABS    Sig: Take 1 tablet by mouth daily.    Dispense:  90 tablet    Refill:  0  . insulin glargine, 2 Unit Dial, (TOUJEO MAX SOLOSTAR) 300 UNIT/ML Solostar Pen    Sig: Inject 100  Units into the skin every morning. And lancets 2/day    Dispense:  5 pen    Refill:  11  . Cholecalciferol 1.25 MG (50000 UT) capsule    Sig: Take 1 capsule (50,000 Units total) by mouth once a week.    Dispense:  12 capsule    Refill:  0   I spent 50 minutes in preparing to see the patient by review of recent labs, imaging and procedures, obtaining and reviewing separately obtained history, communicating with the patient and family or caregiver, ordering medications, tests or procedures, and documenting clinical information in the EHR including the differential Dx, treatment, and any further evaluation and other management of 1. Hyperlipidemia with target LDL less than 100 2. Essential hypertension 3. Vitamin D deficiency disease 4. HYPERTRIGLYCERIDEMIA 5. Type 2 diabetes mellitus with complication, with long-term current use of insulin ([place]) 6. Type II diabetes mellitus with manifestations ([place])     Follow-up: Return in about 3 months (around 06/25/2020).  Scarlette Calico, MD

## 2020-03-31 ENCOUNTER — Telehealth: Payer: Self-pay | Admitting: Internal Medicine

## 2020-03-31 NOTE — Telephone Encounter (Signed)
Closing note, I will add to the lab result message.

## 2020-03-31 NOTE — Telephone Encounter (Signed)
New message:    Pt is calling back and has a specific question about what his A1C was at the time of his labs. Please advise.

## 2020-04-02 MED FILL — VIT D3-50 50,000 UNITS CAPS: 1.25 MG | 28 days supply | Qty: 4 | Fill #1

## 2020-04-10 ENCOUNTER — Other Ambulatory Visit: Payer: Self-pay | Admitting: Internal Medicine

## 2020-04-10 DIAGNOSIS — R809 Proteinuria, unspecified: Secondary | ICD-10-CM

## 2020-04-10 DIAGNOSIS — Z794 Long term (current) use of insulin: Secondary | ICD-10-CM

## 2020-04-10 DIAGNOSIS — I10 Essential (primary) hypertension: Secondary | ICD-10-CM

## 2020-04-10 DIAGNOSIS — E785 Hyperlipidemia, unspecified: Secondary | ICD-10-CM

## 2020-04-10 DIAGNOSIS — E118 Type 2 diabetes mellitus with unspecified complications: Secondary | ICD-10-CM

## 2020-04-10 MED FILL — OLMESARTAN MEDOXOMIL 20 MG: 20 | 30 days supply | Qty: 30 | Fill #0

## 2020-04-10 MED FILL — ROSUVASTATIN CALCIUM 20 MG: 20 | 30 days supply | Qty: 30 | Fill #0

## 2020-04-17 MED FILL — FENOFIBRATE 145 MG TABS: 145 | 30 days supply | Qty: 30 | Fill #1

## 2020-05-07 ENCOUNTER — Ambulatory Visit: Payer: Managed Care, Other (non HMO) | Admitting: Endocrinology

## 2020-05-28 ENCOUNTER — Other Ambulatory Visit: Payer: Self-pay | Admitting: Endocrinology

## 2020-05-28 ENCOUNTER — Other Ambulatory Visit: Payer: Self-pay

## 2020-05-28 ENCOUNTER — Encounter: Payer: Self-pay | Admitting: Endocrinology

## 2020-05-28 ENCOUNTER — Ambulatory Visit: Payer: Managed Care, Other (non HMO) | Admitting: Endocrinology

## 2020-05-28 VITALS — BP 105/70 | HR 88 | Ht 67.0 in | Wt 201.8 lb

## 2020-05-28 DIAGNOSIS — E118 Type 2 diabetes mellitus with unspecified complications: Secondary | ICD-10-CM

## 2020-05-28 DIAGNOSIS — E781 Pure hyperglyceridemia: Secondary | ICD-10-CM

## 2020-05-28 DIAGNOSIS — Z794 Long term (current) use of insulin: Secondary | ICD-10-CM | POA: Diagnosis not present

## 2020-05-28 LAB — POCT GLYCOSYLATED HEMOGLOBIN (HGB A1C): Hemoglobin A1C: 11.9 % — AB (ref 4.0–5.6)

## 2020-05-28 MED ORDER — TOUJEO MAX SOLOSTAR 300 UNIT/ML ~~LOC~~ SOPN: 60.0000 [IU] | PEN_INJECTOR | SUBCUTANEOUS | 11 refills | Status: DC

## 2020-05-28 MED FILL — TOUJEO MAX SOLOSTAR 300 UNI: 300 | 30 days supply | Qty: 6 | Fill #0

## 2020-05-28 NOTE — Patient Instructions (Addendum)
I have sent a prescription to your pharmacy, increase the toujeo to 60 units each morning   On this type of insulin schedule, you should eat meals on a regular schedule.  If a meal is missed or significantly delayed, your blood sugar could go low.   check your blood sugar twice a day.  vary the time of day when you check, between before the 3 meals, and at bedtime.  also check if you have symptoms of your blood sugar being too high or too low.  please keep a record of the readings and bring it to your next appointment here (or you can bring the meter itself).  You can write it on any piece of paper.  please call us sooner if your blood sugar goes below 70, or if you have a lot of readings over 200.   Please come back for a follow-up appointment in 2 months.

## 2020-05-28 NOTE — Progress Notes (Signed)
Subjective:    Patient ID: Tim Wilson, male    DOB: 1966-02-04, 54 y.o.   MRN: [id number]  HPI Pt returns for f/u of diabetes mellitus: DM type: Insulin-requiring type 2.   Dx'ed: 7680 Complications: none Therapy: insulin since 2019, and 2 oral meds.   DKA: never Severe hypoglycemia: never.  Pancreatitis: once, in 2019 SDOH: he could not afford Rybelsus. Other: he also took insulin in 2012, and 2014-2015; he did not tolerate metformin-XR or invokana (diarrhea with both); he stopped pioglitizone due to fatigue and DOE; he works 1st shift; he takes QD insulin, after poor results with multiple daily injections; he also has hypertriglyceridemia.  Interval hx: no cbg record, but states cbg's vary from 150-300.  It is in general higher as the day goes on.  Pt says he never misses the insulin.   Past Medical History:  Diagnosis Date  . Diabetes mellitus   . Hyperlipidemia   . Hypertension     No past surgical history on file.  Social History   Socioeconomic History  . Marital status: Married    Spouse name: Not on file  . Number of children: Not on file  . Years of education: Not on file  . Highest education level: Not on file  Occupational History  . 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 Smoking status: Never Smoker  . Smokeless tobacco: Never Used  Vaping Use  . Vaping Use: Never used  Substance and Sexual Activity  . Alcohol use: Yes    Alcohol/week: 2.0 standard drinks    Types: 2 Cans of beer per week    Comment: on occasion, not in excess  . Drug use: No  . Sexual activity: Yes  Other Topics Concern  . Not on file  Social History Narrative   Regular exercise-yes   Social Determinants of Health   Financial Resource Strain:   . Difficulty of Paying Living Expenses: Not on file  Food Insecurity:   . Worried About Charity fundraiser in the Last Year: Not on  file  . Ran Out of Food in the Last Year: Not on file  Transportation Needs:   . Lack of Transportation (Medical): Not on file  . Lack of Transportation (Non-Medical): Not on file  Physical Activity:   . Days of Exercise per Week: Not on file  . Minutes of Exercise per Session: Not on file  Stress:   . Feeling of Stress : Not on file  Social Connections:   . Frequency of Communication with Friends and Family: Not on file  . Frequency of Social Gatherings with Friends and Family: Not on file  . Attends Religious Services: Not on file  . Active Member of Clubs or Organizations: Not on file  . Attends Archivist Meetings: Not on file  . Marital Status: Not on file  Intimate Partner Violence:   . Fear of Current or Ex-Partner: Not on file  . Emotionally Abused: Not on file  . Physically Abused: Not on file  . Sexually Abused: Not on file    Current Outpatient Medications on File Prior to Visit  Medication Sig Dispense Refill  . aspirin EC 81 MG tablet Take 1 tablet (81 mg total) by mouth daily. 90 tablet 1  . Cholecalciferol 1.25 MG (50000 UT) capsule Take 1 capsule (50,000 Units total) by mouth once a week. 12  capsule 0  . fenofibrate (TRICOR) 145 MG tablet Take 1 tablet (145 mg total) by mouth daily. 90 tablet 0  . glucose blood (ONETOUCH VERIO) test strip 1 each by Other route 2 (two) times daily. And lancets 2/day 100 each 12  . icosapent Ethyl (VASCEPA) 1 g capsule Take 2 capsules (2 g total) by mouth 2 (two) times daily. 360 capsule 1  . olmesartan (BENICAR) 20 MG tablet TAKE 1 TABLET (20 MG TOTAL) BY MOUTH DAILY. 90 tablet 1  . rosuvastatin (CRESTOR) 20 MG tablet TAKE 1 TABLET (20 MG TOTAL) BY MOUTH DAILY. 90 tablet 1   No current facility-administered medications on file prior to visit.    Allergies  Allergen Reactions  . Metformin And Related Diarrhea  . Ramipril     cough    Family History  Problem Relation Age of Onset  . Heart disease Father   . Heart  disease Brother   . Diabetes Mother   . Breast cancer Cousin   . Colon cancer Neg Hx   . Stomach cancer Neg Hx   . Esophageal cancer Neg Hx     BP 105/70   Pulse 88   Ht 5\' 7"  (1.702 m)   Wt 201 lb 12.8 oz (91.5 kg)   SpO2 97%   BMI 31.61 kg/m    Review of Systems He denies hypoglycemia.      Objective:   Physical Exam VITAL SIGNS:  See vs page GENERAL: no distress Pulses: dorsalis pedis intact bilat.   MSK: no deformity of the feet CV: no leg edema Skin:  no ulcer on the feet.  normal color and temp on the feet. Neuro: sensation is intact to touch on the feet  Lab Results  Component Value Date   HGBA1C 11.9 (A) 05/28/2020       Assessment & Plan:  Insulin-requiring type 2 DM: uncontrolled  Patient Instructions  I have sent a prescription to your pharmacy, increase the toujeo to 60 units each morning   On this type of insulin schedule, you should eat meals on a regular schedule.  If a meal is missed or significantly delayed, your blood sugar could go low.   check your blood sugar twice a day.  vary the time of day when you check, between before the 3 meals, and at bedtime.  also check if you have symptoms of your blood sugar being too high or too low.  please keep a record of the readings and bring it to your next appointment here (or you can bring the meter itself).  You can write it on any piece of paper.  please call us sooner if your blood sugar goes below 70, or if you have a lot of readings over 200.   Please come back for a follow-up appointment in 2 months.

## 2020-06-20 MED FILL — ROSUVASTATIN CALCIUM 20 MG: 20 | 30 days supply | Qty: 30 | Fill #1

## 2020-06-20 MED FILL — OLMESARTAN MEDOXOMIL 20 MG: 20 | 30 days supply | Qty: 30 | Fill #1

## 2020-07-31 ENCOUNTER — Ambulatory Visit: Payer: Managed Care, Other (non HMO) | Admitting: Endocrinology

## 2020-08-25 MED FILL — ROSUVASTATIN CALCIUM 20 MG: 20 | 30 days supply | Qty: 30 | Fill #2

## 2020-08-25 MED FILL — TOUJEO MAX SOLOSTAR 300 UNI: 300 | 30 days supply | Qty: 6 | Fill #1

## 2020-08-25 MED FILL — FENOFIBRATE 145 MG TABS: 145 | 30 days supply | Qty: 30 | Fill #2

## 2020-08-25 MED FILL — OLMESARTAN MEDOXOMIL 20 MG: 20 | 30 days supply | Qty: 30 | Fill #2

## 2020-09-09 ENCOUNTER — Ambulatory Visit: Payer: Managed Care, Other (non HMO) | Admitting: Endocrinology

## 2020-09-09 ENCOUNTER — Encounter: Payer: Self-pay | Admitting: Endocrinology

## 2020-09-09 ENCOUNTER — Other Ambulatory Visit: Payer: Self-pay

## 2020-09-09 VITALS — BP 118/80 | HR 86 | Ht 68.0 in | Wt 200.6 lb

## 2020-09-09 DIAGNOSIS — E118 Type 2 diabetes mellitus with unspecified complications: Secondary | ICD-10-CM | POA: Diagnosis not present

## 2020-09-09 DIAGNOSIS — E781 Pure hyperglyceridemia: Secondary | ICD-10-CM | POA: Diagnosis not present

## 2020-09-09 DIAGNOSIS — Z23 Encounter for immunization: Secondary | ICD-10-CM | POA: Diagnosis not present

## 2020-09-09 DIAGNOSIS — Z794 Long term (current) use of insulin: Secondary | ICD-10-CM

## 2020-09-09 LAB — POCT GLYCOSYLATED HEMOGLOBIN (HGB A1C): Hemoglobin A1C: 14 % — AB (ref 4.0–5.6)

## 2020-09-09 MED ORDER — TOUJEO MAX SOLOSTAR 300 UNIT/ML ~~LOC~~ SOPN: 80.0000 [IU] | PEN_INJECTOR | SUBCUTANEOUS | 3 refills | Status: DC

## 2020-09-09 NOTE — Progress Notes (Signed)
Subjective:    Patient ID: Tim Wilson, male    DOB: [birth year], 54 y.o.   MRN: [id number]  HPI Pt returns for f/u of diabetes mellitus: DM type: Insulin-requiring type 2.   Dx'ed: 5462 Complications: none Therapy: insulin since 2019, and 2 oral meds.   DKA: never Severe hypoglycemia: never.  Pancreatitis: once, in 2019 SDOH: he could not afford Rybelsus. Other: he also took insulin in 2012, and 2014-2015; he did not tolerate metformin-XR or invokana (diarrhea with both); he stopped pioglitizone due to fatigue and DOE; he works 1st shift; he takes QD insulin, after poor results with multiple daily injections; he also has hypertriglyceridemia.  Interval hx: no cbg record, but states cbg's vary from 127-325.  He checks fasting only.  Pt again says he never misses the insulin.  Pt says 1 pen lasts 2 weeks.  Past Medical History:  Diagnosis Date  . Diabetes mellitus   . Hyperlipidemia   . Hypertension     No past surgical history on file.  Social History   Socioeconomic History  . Marital status: Married    Spouse name: Not on file  . Number of children: Not on file  . Years of education: Not on file  . Highest education level: Not on file  Occupational History  . 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 Smoking status: Never Smoker  . Smokeless tobacco: Never Used  Vaping Use  . Vaping Use: Never used  Substance and Sexual Activity  . Alcohol use: Yes    Alcohol/week: 2.0 standard drinks    Types: 2 Cans of beer per week    Comment: on occasion, not in excess  . Drug use: No  . Sexual activity: Yes  Other Topics Concern  . Not on file  Social History Narrative   Regular exercise-yes   Social Determinants of Health   Financial Resource Strain: Not on file  Food Insecurity: Not on file  Transportation Needs: Not on file  Physical Activity: Not on file  Stress:  Not on file  Social Connections: Not on file  Intimate Partner Violence: Not on file    Current Outpatient Medications on File Prior to Visit  Medication Sig Dispense Refill  . aspirin EC 81 MG tablet Take 1 tablet (81 mg total) by mouth daily. 90 tablet 1  . Cholecalciferol 1.25 MG (50000 UT) capsule Take 1 capsule (50,000 Units total) by mouth once a week. 12 capsule 0  . fenofibrate (TRICOR) 145 MG tablet Take 1 tablet (145 mg total) by mouth daily. 90 tablet 0  . glucose blood (ONETOUCH VERIO) test strip 1 each by Other route 2 (two) times daily. And lancets 2/day 100 each 12  . olmesartan (BENICAR) 20 MG tablet TAKE 1 TABLET (20 MG TOTAL) BY MOUTH DAILY. 90 tablet 1  . rosuvastatin (CRESTOR) 20 MG tablet TAKE 1 TABLET (20 MG TOTAL) BY MOUTH DAILY. 90 tablet 1  . icosapent Ethyl (VASCEPA) 1 g capsule Take 2 capsules (2 g total) by mouth 2 (two) times daily. (Patient not taking: Reported on 09/09/2020) 360 capsule 1   No current facility-administered medications on file prior to visit.    Allergies  Allergen Reactions  . Metformin And Related Diarrhea  . Ramipril     cough    Family History  Problem Relation Age of Onset  . Heart disease Father   .  Heart disease Brother   . Diabetes Mother   . Breast cancer Cousin   . Colon cancer Neg Hx   . Stomach cancer Neg Hx   . Esophageal cancer Neg Hx     BP 118/80   Pulse 86   Ht 5\' 8"  (1.727 m)   Wt 200 lb 9.6 oz (91 kg)   SpO2 95%   BMI 30.50 kg/m    Review of Systems  He denies hypoglycemia/n/v/sob    Objective:   Physical Exam VITAL SIGNS:  See vs page GENERAL: no distress Pulses: dorsalis pedis intact bilat.   MSK: no deformity of the feet.  CV: no leg edema.  Skin:  no ulcer on the feet.  normal color and temp on the feet. Neuro: sensation is intact to touch on the feet.    A1c=14%     Assessment & Plan:  Insulin-requiring type 2 DM, uncontrolled.  This is usually due to noncompliance  Patient  Instructions  I have sent a prescription to your pharmacy, increase the toujeo to 80 units each morning   On this type of insulin schedule, you should eat meals on a regular schedule.  If a meal is missed or significantly delayed, your blood sugar could go low.   check your blood sugar twice a day.  vary the time of day when you check, between before the 3 meals, and at bedtime.  also check if you have symptoms of your blood sugar being too high or too low.  please keep a record of the readings and bring it to your next appointment here (or you can bring the meter itself).  You can write it on any piece of paper.  please call us sooner if your blood sugar goes below 70, or if you have a lot of readings over 200.   Please come back for a follow-up appointment in 2 months.

## 2020-09-09 NOTE — Patient Instructions (Addendum)
I have sent a prescription to your pharmacy, increase the toujeo to 80 units each morning   On this type of insulin schedule, you should eat meals on a regular schedule.  If a meal is missed or significantly delayed, your blood sugar could go low.   check your blood sugar twice a day.  vary the time of day when you check, between before the 3 meals, and at bedtime.  also check if you have symptoms of your blood sugar being too high or too low.  please keep a record of the readings and bring it to your next appointment here (or you can bring the meter itself).  You can write it on any piece of paper.  please call us sooner if your blood sugar goes below 70, or if you have a lot of readings over 200.   Please come back for a follow-up appointment in 2 months.

## 2020-11-07 ENCOUNTER — Other Ambulatory Visit (HOSPITAL_BASED_OUTPATIENT_CLINIC_OR_DEPARTMENT_OTHER): Payer: Self-pay | Admitting: Physician Assistant

## 2020-11-07 MED FILL — CLOTRIMAZOLE-BETAMETHASONE: 1-0.05 | 15 days supply | Qty: 45 | Fill #0

## 2020-11-26 ENCOUNTER — Other Ambulatory Visit: Payer: Self-pay | Admitting: Internal Medicine

## 2020-11-26 DIAGNOSIS — E781 Pure hyperglyceridemia: Secondary | ICD-10-CM

## 2020-11-26 MED FILL — OLMESARTAN MEDOXOMIL 20 MG: 20 | 30 days supply | Qty: 30 | Fill #3

## 2020-11-26 MED FILL — ROSUVASTATIN CALCIUM 20 MG: 20 | 30 days supply | Qty: 30 | Fill #3

## 2020-12-10 ENCOUNTER — Other Ambulatory Visit: Payer: Self-pay

## 2020-12-10 ENCOUNTER — Other Ambulatory Visit: Payer: Self-pay | Admitting: Internal Medicine

## 2020-12-10 ENCOUNTER — Ambulatory Visit: Payer: Managed Care, Other (non HMO) | Admitting: Internal Medicine

## 2020-12-10 ENCOUNTER — Encounter: Payer: Self-pay | Admitting: Internal Medicine

## 2020-12-10 VITALS — BP 160/92 | HR 97 | Temp 98.2°F | Resp 16 | Ht 68.0 in | Wt 199.0 lb

## 2020-12-10 DIAGNOSIS — E785 Hyperlipidemia, unspecified: Secondary | ICD-10-CM | POA: Diagnosis not present

## 2020-12-10 DIAGNOSIS — R9431 Abnormal electrocardiogram [ECG] [EKG]: Secondary | ICD-10-CM

## 2020-12-10 DIAGNOSIS — I10 Essential (primary) hypertension: Secondary | ICD-10-CM | POA: Diagnosis not present

## 2020-12-10 DIAGNOSIS — E118 Type 2 diabetes mellitus with unspecified complications: Secondary | ICD-10-CM | POA: Diagnosis not present

## 2020-12-10 DIAGNOSIS — E139 Other specified diabetes mellitus without complications: Secondary | ICD-10-CM

## 2020-12-10 DIAGNOSIS — Z0000 Encounter for general adult medical examination without abnormal findings: Secondary | ICD-10-CM | POA: Diagnosis not present

## 2020-12-10 DIAGNOSIS — R800 Isolated proteinuria: Secondary | ICD-10-CM | POA: Diagnosis not present

## 2020-12-10 DIAGNOSIS — Z1159 Encounter for screening for other viral diseases: Secondary | ICD-10-CM | POA: Insufficient documentation

## 2020-12-10 DIAGNOSIS — E781 Pure hyperglyceridemia: Secondary | ICD-10-CM | POA: Diagnosis not present

## 2020-12-10 DIAGNOSIS — Z794 Long term (current) use of insulin: Secondary | ICD-10-CM | POA: Diagnosis not present

## 2020-12-10 DIAGNOSIS — R809 Proteinuria, unspecified: Secondary | ICD-10-CM

## 2020-12-10 MED ORDER — OLMESARTAN MEDOXOMIL 20 MG PO TABS: 20.0000 mg | ORAL_TABLET | Freq: Every day | ORAL | 1 refills | Status: DC

## 2020-12-10 MED ORDER — FENOFIBRATE 145 MG PO TABS: 145.0000 mg | ORAL_TABLET | Freq: Every day | ORAL | 0 refills | Status: DC

## 2020-12-10 MED ORDER — ROSUVASTATIN CALCIUM 20 MG PO TABS: 20.0000 mg | ORAL_TABLET | Freq: Every day | ORAL | 1 refills | Status: DC

## 2020-12-10 MED FILL — ROSUVASTATIN CALCIUM 20 MG: 20 | 30 days supply | Qty: 30 | Fill #0

## 2020-12-10 MED FILL — OLMESARTAN MEDOXOMIL 20 MG: 20 | 30 days supply | Qty: 30 | Fill #0

## 2020-12-10 MED FILL — FENOFIBRATE 145 MG TABS: 145 | 30 days supply | Qty: 30 | Fill #0

## 2020-12-10 NOTE — Patient Instructions (Signed)

## 2020-12-10 NOTE — Progress Notes (Signed)
Subjective:  Patient ID: Tim Wilson, male    DOB: 27-Dec-1965  Age: 55 y.o. MRN: [id number]  CC: Annual Exam, Diabetes, and Hyperlipidemia  This visit occurred during the SARS-CoV-2 public health emergency.  Safety protocols were in place, including screening questions prior to the visit, additional usage of staff PPE, and extensive cleaning of exam room while observing appropriate contact time as indicated for disinfecting solutions.    HPI Tim Wilson presents for a CPX.  He does not monitor his blood pressure or his blood sugar.  He is not sure which meds he has or has not been compliant with.  According to prescription refills he would have run out of some of his meds.  He complains of polys but denies abdominal pain, nausea, vomiting, loss of appetite, or diarrhea.  He is active and denies any episodes of chest pain, shortness of breath, palpitations, edema, or fatigue.   Outpatient Medications Prior to Visit  Medication Sig Dispense Refill  . aspirin EC 81 MG tablet Take 1 tablet (81 mg total) by mouth daily. 90 tablet 1  . Cholecalciferol 1.25 MG (50000 UT) capsule Take 1 capsule (50,000 Units total) by mouth once a week. 12 capsule 0  . glucose blood (ONETOUCH VERIO) test strip 1 each by Other route 2 (two) times daily. And lancets 2/day 100 each 12  . fenofibrate (TRICOR) 145 MG tablet Take 1 tablet (145 mg total) by mouth daily. 90 tablet 0  . icosapent Ethyl (VASCEPA) 1 g capsule Take 2 capsules (2 g total) by mouth 2 (two) times daily. 360 capsule 1  . insulin glargine, 2 Unit Dial, (TOUJEO MAX SOLOSTAR) 300 UNIT/ML Solostar Pen Inject 80 Units into the skin every morning. 24 mL 3  . olmesartan (BENICAR) 20 MG tablet TAKE 1 TABLET (20 MG TOTAL) BY MOUTH DAILY. 90 tablet 1  . rosuvastatin (CRESTOR) 20 MG tablet TAKE 1 TABLET (20 MG TOTAL) BY MOUTH DAILY. 90 tablet 1   No facility-administered medications prior to visit.    ROS Review of Systems  Constitutional:  Negative for appetite change, chills, diaphoresis, fatigue and unexpected weight change.  HENT: Negative.   Eyes: Negative for visual disturbance.  Respiratory: Negative for cough, chest tightness, shortness of breath and wheezing.   Cardiovascular: Negative for chest pain, palpitations and leg swelling.  Gastrointestinal: Negative for abdominal pain, constipation, diarrhea, nausea and vomiting.  Endocrine: Positive for polydipsia, polyphagia and polyuria. Negative for cold intolerance.  Genitourinary: Negative.  Negative for difficulty urinating, dysuria, frequency, penile pain, scrotal swelling, testicular pain and urgency.  Musculoskeletal: Negative for arthralgias, back pain, myalgias and neck pain.  Skin: Negative.  Negative for color change and pallor.  Neurological: Negative.  Negative for dizziness, weakness, light-headedness, numbness and headaches.  Hematological: Negative for adenopathy. Does not bruise/bleed easily.  Psychiatric/Behavioral: Negative.     Objective:  BP (!) 160/92   Pulse 97   Temp 98.2 F (36.8 C) (Oral)   Resp 16   Ht 5\' 8"  (1.727 m)   Wt 199 lb (90.3 kg)   SpO2 97%   BMI 30.26 kg/m   BP Readings from Last 3 Encounters:  12/10/20 (!) 160/92  09/09/20 118/80  05/28/20 105/70    Wt Readings from Last 3 Encounters:  12/10/20 199 lb (90.3 kg)  09/09/20 200 lb 9.6 oz (91 kg)  05/28/20 201 lb 12.8 oz (91.5 kg)    Physical Exam Vitals reviewed.  Constitutional:      General: He  is not in acute distress.    Appearance: He is not toxic-appearing or diaphoretic.  HENT:     Nose: Nose normal.     Mouth/Throat:     Mouth: Mucous membranes are moist.  Eyes:     General: No scleral icterus.    Conjunctiva/sclera: Conjunctivae normal.  Cardiovascular:     Rate and Rhythm: Normal rate and regular rhythm.     Heart sounds: No murmur heard.     Comments: EKG- NSR, 83 bpm LAD Loss of voltage over the lateral leads is new No LVH or Q  waves Pulmonary:     Effort: Pulmonary effort is normal.     Breath sounds: No stridor. No wheezing, rhonchi or rales.  Abdominal:     General: Abdomen is flat.     Palpations: There is no mass.     Tenderness: There is no abdominal tenderness. There is no guarding or rebound.     Hernia: No hernia is present. There is no hernia in the left inguinal area or right inguinal area.  Genitourinary:    Pubic Area: No rash.      Penis: Normal.      Testes: Normal.        Right: Mass not present.        Left: Mass not present.     Epididymis:     Right: Normal. Not inflamed or enlarged.     Left: Normal. Not inflamed or enlarged.     Prostate: Normal. Not enlarged, not tender and no nodules present.     Rectum: Normal. Guaiac result negative. No mass, tenderness, anal fissure, external hemorrhoid or internal hemorrhoid. Normal anal tone.  Musculoskeletal:     Cervical back: Neck supple.     Right lower leg: No edema.     Left lower leg: No edema.  Lymphadenopathy:     Cervical: No cervical adenopathy.     Lower Body: No right inguinal adenopathy. No left inguinal adenopathy.  Neurological:     Mental Status: He is alert.     Lab Results  Component Value Date   WBC 8.5 12/11/2020   HGB 14.2 12/11/2020   HCT 40.2 12/11/2020   PLT 300 12/11/2020   GLUCOSE 375 (H) 12/11/2020   CHOL 733 (H) 12/11/2020   TRIG 7,582 (H) 12/11/2020   HDL 15 (L) 12/11/2020   LDLDIRECT 49.0 05/15/2019   [place]  12/11/2020     Comment:     . LDL cholesterol not calculated. Triglyceride levels greater than 400 mg/dL invalidate calculated LDL results. . Reference range: <100 . Desirable range <100 mg/dL for primary prevention;   <70 mg/dL for patients with CHD or diabetic patients  with > or = 2 CHD risk factors. Marland Kitchen LDL-C is now calculated using the Martin-Hopkins  calculation, which is a validated novel method providing  better accuracy than the Friedewald equation in the  estimation of LDL-C.   Cresenciano Genre et al. Annamaria Helling. 4259;563(87): 2061-2068  (http://education.[facility].com/faq/FAQ164)    ALT 16 12/11/2020   AST 15 12/11/2020   NA 152 (H) 12/11/2020   K 4.6 12/11/2020   CL 108 12/11/2020   CREATININE 1.23 12/11/2020   BUN 13 12/11/2020   CO2 16 (L) 12/11/2020   TSH 1.24 12/11/2020   PSA 0.41 12/11/2020   HGBA1C 11.9 (H) 12/11/2020   MICROALBUR 23.0 (H) 12/10/2020    No results found.  Assessment & Plan:   Fordyce was seen today for annual exam, diabetes and hyperlipidemia.  Diagnoses and all orders for this visit:  Essential hypertension- His blood pressure is not adequately well controlled.  I will check labs to screen for secondary causes and endorgan damage.  Will restart the ARB.  Will add amlodipine.  His calcium level is elevated at 11.0 so will avoid thiazide diuretics.  Will add a loop diuretic.  He has some EKG changes so I have asked him to see a cardiologist who specializes in hypertension as soon as possible. -     CBC with Differential/Platelet; Future -     Basic metabolic panel; Future -     TSH; Future -     Aldosterone + renin activity w/ ratio; Future -     EKG 12-Lead -     Aldosterone + renin activity w/ ratio -     TSH -     Basic metabolic panel -     CBC with Differential/Platelet -     olmesartan (BENICAR) 20 MG tablet; Take 1 tablet (20 mg total) by mouth daily. -     amLODipine (NORVASC) 5 MG tablet; Take 1 tablet (5 mg total) by mouth daily. -     torsemide (DEMADEX) 10 MG tablet; Take 1 tablet (10 mg total) by mouth daily.  Type 2 diabetes mellitus with complication, with long-term current use of insulin (Sylvarena)- His blood sugar is not adequately well controlled.  He does not tolerate Metformin.  I have asked him to restart his basal insulin and add bolus insulin.  I spoke to his endocrinologist and he will see him as soon as possible. -     Basic metabolic panel; Future -     Microalbumin / creatinine urine ratio; Future -      Hemoglobin A1c; Future -     rosuvastatin (CRESTOR) 20 MG tablet; Take 1 tablet (20 mg total) by mouth daily. -     Hemoglobin A1c -     Microalbumin / creatinine urine ratio -     Basic metabolic panel -     olmesartan (BENICAR) 20 MG tablet; Take 1 tablet (20 mg total) by mouth daily. -     insulin glargine, 2 Unit Dial, (TOUJEO MAX SOLOSTAR) 300 UNIT/ML Solostar Pen; Inject 100 Units into the skin every morning. -     insulin lispro (HUMALOG KWIKPEN) 200 UNIT/ML KwikPen; Inject 10 Units into the skin with breakfast, with lunch, and with evening meal.  Hyperlipidemia with target LDL less than 100- LDL could not be tested because the trigs are very high. Will continue rosuvastatin at the current dose. -     Hepatic function panel; Future -     rosuvastatin (CRESTOR) 20 MG tablet; Take 1 tablet (20 mg total) by mouth daily. -     Hepatic function panel  HYPERTRIGLYCERIDEMIA- His triglycerides are dangerously elevated but he is asymptomatic.  Will restart the fenofibrate and icosapent ethyl.  Will be more aggressive with his insulin therapy.  He will also see his endocrinologist as soon as possible. -     fenofibrate (TRICOR) 145 MG tablet; Take 1 tablet (145 mg total) by mouth daily. -     insulin glargine, 2 Unit Dial, (TOUJEO MAX SOLOSTAR) 300 UNIT/ML Solostar Pen; Inject 100 Units into the skin every morning. -     insulin lispro (HUMALOG KWIKPEN) 200 UNIT/ML KwikPen; Inject 10 Units into the skin with breakfast, with lunch, and with evening meal. -     Ambulatory referral to Endocrinology -  icosapent Ethyl (VASCEPA) 1 g capsule; Take 2 capsules (2 g total) by mouth 2 (two) times daily.  Isolated proteinuria without specific morphologic lesion- Will restart the ARB and will try to get better control of his blood sugar and blood pressure. -     Microalbumin / creatinine urine ratio; Future -     Urinalysis, Routine w reflex microscopic; Future -     Urinalysis, Routine w reflex  microscopic -     Microalbumin / creatinine urine ratio  Routine general medical examination at a health care facility- Exam completed, labs reviewed, vaccines reviewed, cancer screenings are up-to-date, patient education was given. -     Lipid panel; Future -     PSA; Future -     PSA -     Lipid panel  Need for hepatitis C screening test -     Hepatitis C antibody; Future -     Hepatitis C antibody  Abnormal electrocardiogram (ECG) (EKG)- See above. -     Ambulatory referral to Cardiology  Proteinuria, unspecified type- Will restart the ARB. -     olmesartan (BENICAR) 20 MG tablet; Take 1 tablet (20 mg total) by mouth daily.  Diabetes mellitus type 1.5, managed as type 1 ([place])- See above. -     Amb Referral to Nutrition and Diabetic Education -     Cancel: Ambulatory referral to Endocrinology -     Glucagon (GVOKE HYPOPEN 2-PACK) 1 MG/0.2ML SOAJ; Inject 1 Act into the skin daily as needed. -     Continuous Blood Gluc Sensor (FREESTYLE LIBRE 2 SENSOR) MISC; 1 Act by Does not apply route daily. -     Continuous Blood Gluc Receiver (FREESTYLE LIBRE 2 READER) DEVI; 1 Act by Does not apply route daily. -     Ambulatory referral to Endocrinology  Hypercalcemia-  Ca++ is 11.0. He is asymptomatic with this.  I recommended that he see endocrinology as soon as possible. -     Ambulatory referral to Endocrinology   I have changed Antionette Fairy. Vigna's Toujeo Max SoloStar. I am also having him start on HumaLOG KwikPen, Gvoke HypoPen 2-Pack, FreeStyle Libre 2 Sensor, [employer] 2 Reader, amLODipine, and torsemide. Additionally, I am having him maintain his glucose blood, aspirin EC, Cholecalciferol, fenofibrate, rosuvastatin, olmesartan, and icosapent Ethyl.  Meds ordered this encounter  Medications  . fenofibrate (TRICOR) 145 MG tablet    Sig: Take 1 tablet (145 mg total) by mouth daily.    Dispense:  90 tablet    Refill:  0  . rosuvastatin (CRESTOR) 20 MG tablet    Sig: Take 1 tablet  (20 mg total) by mouth daily.    Dispense:  90 tablet    Refill:  1  . olmesartan (BENICAR) 20 MG tablet    Sig: Take 1 tablet (20 mg total) by mouth daily.    Dispense:  90 tablet    Refill:  1  . insulin glargine, 2 Unit Dial, (TOUJEO MAX SOLOSTAR) 300 UNIT/ML Solostar Pen    Sig: Inject 100 Units into the skin every morning.    Dispense:  24 mL    Refill:  3  . insulin lispro (HUMALOG KWIKPEN) 200 UNIT/ML KwikPen    Sig: Inject 10 Units into the skin with breakfast, with lunch, and with evening meal.    Dispense:  9 mL    Refill:  1  . Glucagon (GVOKE HYPOPEN 2-PACK) 1 MG/0.2ML SOAJ    Sig: Inject 1 Act into the  skin daily as needed.    Dispense:  2 mL    Refill:  5  . Continuous Blood Gluc Sensor (FREESTYLE LIBRE 2 SENSOR) MISC    Sig: 1 Act by Does not apply route daily.    Dispense:  2 each    Refill:  5  . Continuous Blood Gluc Receiver (FREESTYLE LIBRE 2 READER) DEVI    Sig: 1 Act by Does not apply route daily.    Dispense:  2 each    Refill:  5  . icosapent Ethyl (VASCEPA) 1 g capsule    Sig: Take 2 capsules (2 g total) by mouth 2 (two) times daily.    Dispense:  360 capsule    Refill:  1  . amLODipine (NORVASC) 5 MG tablet    Sig: Take 1 tablet (5 mg total) by mouth daily.    Dispense:  90 tablet    Refill:  0  . torsemide (DEMADEX) 10 MG tablet    Sig: Take 1 tablet (10 mg total) by mouth daily.    Dispense:  90 tablet    Refill:  0   In addition to time spent on CPE, I spent 50 minutes in preparing to see the patient by review of recent labs obtaining and reviewing separately obtained history, communicating with the patient and endo doctor, ordering medications, and documenting clinical information in the EHR including the differential Dx, treatment, and any further evaluation and other management of 1. Essential hypertension 2. Type 2 diabetes mellitus with complication, with long-term current use of insulin ([place]) 3. Hyperlipidemia with target LDL less than  100 4. HYPERTRIGLYCERIDEMIA 5. Isolated proteinuria without specific morphologic lesion 6. Abnormal electrocardiogram (ECG) (EKG) 7. Proteinuria, unspecified type 8. Diabetes mellitus type 1.5, managed as type 1 ([place]) 9. Hypercalcemia     Follow-up: Return in about 3 months (around 03/12/2021).  Scarlette Calico, MD

## 2020-12-11 ENCOUNTER — Other Ambulatory Visit: Payer: Managed Care, Other (non HMO)

## 2020-12-11 ENCOUNTER — Telehealth: Payer: Self-pay

## 2020-12-11 DIAGNOSIS — Z794 Long term (current) use of insulin: Secondary | ICD-10-CM

## 2020-12-11 DIAGNOSIS — E785 Hyperlipidemia, unspecified: Secondary | ICD-10-CM

## 2020-12-11 DIAGNOSIS — I10 Essential (primary) hypertension: Secondary | ICD-10-CM

## 2020-12-11 DIAGNOSIS — E118 Type 2 diabetes mellitus with unspecified complications: Secondary | ICD-10-CM

## 2020-12-11 DIAGNOSIS — Z0000 Encounter for general adult medical examination without abnormal findings: Secondary | ICD-10-CM

## 2020-12-11 LAB — MICROALBUMIN / CREATININE URINE RATIO
Creatinine,U: 64.6 mg/dL
Microalb Creat Ratio: 35.6 mg/g — ABNORMAL HIGH (ref 0.0–30.0)
Microalb, Ur: 23 mg/dL — ABNORMAL HIGH (ref 0.0–1.9)

## 2020-12-11 LAB — URINALYSIS, ROUTINE W REFLEX MICROSCOPIC
Bilirubin Urine: NEGATIVE
Ketones, ur: NEGATIVE
Leukocytes,Ua: NEGATIVE
Nitrite: NEGATIVE
Specific Gravity, Urine: 1.02 (ref 1.000–1.030)
Urine Glucose: >=1000 — AB
Urobilinogen, UA: 0.2 (ref 0.0–1.0)
pH: 5.5 (ref 5.0–8.0)

## 2020-12-11 NOTE — Telephone Encounter (Signed)
Called patient to see if he could come in on 12/16/20 to see MP. Of so please schedule in the AM per MP

## 2020-12-12 ENCOUNTER — Other Ambulatory Visit: Payer: Self-pay | Admitting: Internal Medicine

## 2020-12-12 DIAGNOSIS — E139 Other specified diabetes mellitus without complications: Secondary | ICD-10-CM | POA: Insufficient documentation

## 2020-12-12 MED ORDER — GVOKE HYPOPEN 2-PACK 1 MG/0.2ML ~~LOC~~ SOAJ: 1.0000 | Freq: Every day | SUBCUTANEOUS | 5 refills | Status: DC | PRN

## 2020-12-12 MED ORDER — FREESTYLE LIBRE 2 SENSOR MISC: 1.0000 | Freq: Every day | 5 refills | Status: DC

## 2020-12-12 MED ORDER — HUMALOG KWIKPEN 200 UNIT/ML ~~LOC~~ SOPN: 10.0000 [IU] | PEN_INJECTOR | Freq: Three times a day (TID) | SUBCUTANEOUS | 1 refills | Status: DC

## 2020-12-12 MED ORDER — TOUJEO MAX SOLOSTAR 300 UNIT/ML ~~LOC~~ SOPN: 100.0000 [IU] | PEN_INJECTOR | SUBCUTANEOUS | 3 refills | Status: DC

## 2020-12-12 MED ORDER — FREESTYLE LIBRE 2 READER DEVI: 1.0000 | Freq: Every day | 5 refills | Status: DC

## 2020-12-12 MED FILL — TOUJEO MAX SOLOSTAR 300 UNI: 300 | 27 days supply | Qty: 9 | Fill #0

## 2020-12-12 MED FILL — GVOKE HYPOPEN 2-PACK 1 MG/0: 1 | 2 days supply | Qty: 0 | Fill #0

## 2020-12-12 MED FILL — HUMALOG 200 UNITS/ML KWIKPE: 200 | 20 days supply | Qty: 3 | Fill #0

## 2020-12-12 MED FILL — FREESTYLE LIBRE 2 READER SY: 30 days supply | Qty: 1 | Fill #0

## 2020-12-12 MED FILL — FREESTYLE LIBRE 2 SENSOR MI: 28 days supply | Qty: 2 | Fill #0

## 2020-12-13 ENCOUNTER — Telehealth: Payer: Self-pay | Admitting: Endocrinology

## 2020-12-13 LAB — HEPATIC FUNCTION PANEL
AG Ratio: 1.5 (calc) (ref 1.0–2.5)
ALT: 16 U/L (ref 9–46)
AST: 15 U/L (ref 10–35)
Albumin: 5.3 g/dL — ABNORMAL HIGH (ref 3.6–5.1)
Alkaline phosphatase (APISO): 61 U/L (ref 35–144)
Bilirubin, Direct: 0.1 mg/dL (ref 0.0–0.2)
Globulin: 3.6 g/dL (calc) (ref 1.9–3.7)
Indirect Bilirubin: 0.1 mg/dL (calc) — ABNORMAL LOW (ref 0.2–1.2)
Total Bilirubin: 0.2 mg/dL (ref 0.2–1.2)
Total Protein: 8.9 g/dL — ABNORMAL HIGH (ref 6.1–8.1)

## 2020-12-13 LAB — CBC WITH DIFFERENTIAL/PLATELET
Absolute Monocytes: 510 cells/uL (ref 200–950)
Basophils Absolute: 94 cells/uL (ref 0–200)
Basophils Relative: 1.1 %
Eosinophils Absolute: 128 cells/uL (ref 15–500)
Eosinophils Relative: 1.5 %
HCT: 40.2 % (ref 38.5–50.0)
Hemoglobin: 14.2 g/dL (ref 13.2–17.1)
Lymphs Abs: 3545 cells/uL (ref 850–3900)
MCH: 28.9 pg (ref 27.0–33.0)
MCHC: 35.3 g/dL (ref 32.0–36.0)
MCV: 81.9 fL (ref 80.0–100.0)
MPV: 12.8 fL — ABNORMAL HIGH (ref 7.5–12.5)
Monocytes Relative: 6 %
Neutro Abs: 4225 cells/uL (ref 1500–7800)
Neutrophils Relative %: 49.7 %
Platelets: 300 10*3/uL (ref 140–400)
RBC: 4.91 10*6/uL (ref 4.20–5.80)
RDW: 14.5 % (ref 11.0–15.0)
Total Lymphocyte: 41.7 %
WBC: 8.5 10*3/uL (ref 3.8–10.8)

## 2020-12-13 LAB — BASIC METABOLIC PANEL
BUN: 13 mg/dL (ref 7–25)
CO2: 16 mmol/L — ABNORMAL LOW (ref 20–32)
Calcium: 11 mg/dL — ABNORMAL HIGH (ref 8.6–10.3)
Chloride: 108 mmol/L (ref 98–110)
Creat: 1.23 mg/dL (ref 0.70–1.33)
Glucose, Bld: 375 mg/dL — ABNORMAL HIGH (ref 65–99)
Potassium: 4.6 mmol/L (ref 3.5–5.3)
Sodium: 152 mmol/L — ABNORMAL HIGH (ref 135–146)

## 2020-12-13 LAB — HEMOGLOBIN A1C
Hgb A1c MFr Bld: 11.9 % of total Hgb — ABNORMAL HIGH (ref <5.7)
Mean Plasma Glucose: 295 mg/dL
eAG (mmol/L): 16.3 mmol/L

## 2020-12-13 LAB — PSA: PSA: 0.41 ng/mL (ref <=4.0)

## 2020-12-13 LAB — TSH: TSH: 1.24 mIU/L (ref 0.40–4.50)

## 2020-12-13 LAB — LIPID PANEL
Cholesterol: 733 mg/dL — ABNORMAL HIGH (ref <200)
HDL: 15 mg/dL — ABNORMAL LOW (ref >=40)
Non-HDL Cholesterol (Calc): 718 mg/dL (calc) — ABNORMAL HIGH (ref <130)
Total CHOL/HDL Ratio: 48.9 (calc) — ABNORMAL HIGH (ref <5.0)
Triglycerides: 7582 mg/dL — ABNORMAL HIGH (ref <150)

## 2020-12-13 NOTE — Telephone Encounter (Signed)
please contact patient: Needs f/u next available.

## 2020-12-14 ENCOUNTER — Other Ambulatory Visit: Payer: Self-pay | Admitting: Internal Medicine

## 2020-12-14 MED ORDER — ICOSAPENT ETHYL 1 G PO CAPS: 2.0000 g | ORAL_CAPSULE | Freq: Two times a day (BID) | ORAL | 1 refills | Status: DC

## 2020-12-14 MED ORDER — TORSEMIDE 10 MG PO TABS: 10.0000 mg | ORAL_TABLET | Freq: Every day | ORAL | 0 refills | Status: DC

## 2020-12-14 MED ORDER — AMLODIPINE BESYLATE 5 MG PO TABS: 5.0000 mg | ORAL_TABLET | Freq: Every day | ORAL | 0 refills | Status: DC

## 2020-12-14 NOTE — Telephone Encounter (Signed)
Please contact pt to schedule F/U appt  Thank you,  Leamon Arnt

## 2020-12-15 LAB — ALDOSTERONE + RENIN ACTIVITY W/ RATIO: Aldosterone: 2 ng/dL

## 2020-12-15 LAB — HEPATITIS C ANTIBODY
Hepatitis C Ab: NONREACTIVE
SIGNAL TO CUT-OFF: 0 (ref <1.00)

## 2020-12-15 NOTE — Telephone Encounter (Signed)
LMTCB to sch

## 2020-12-17 ENCOUNTER — Other Ambulatory Visit: Payer: Self-pay | Admitting: Endocrinology

## 2021-01-01 ENCOUNTER — Other Ambulatory Visit: Payer: Self-pay

## 2021-01-01 ENCOUNTER — Encounter: Payer: Self-pay | Admitting: Cardiology

## 2021-01-01 ENCOUNTER — Ambulatory Visit: Payer: Managed Care, Other (non HMO) | Admitting: Cardiology

## 2021-01-01 ENCOUNTER — Other Ambulatory Visit: Payer: Self-pay | Admitting: Cardiology

## 2021-01-01 VITALS — BP 137/89 | HR 89 | Temp 97.1°F | Resp 17 | Ht 68.0 in | Wt 196.8 lb

## 2021-01-01 DIAGNOSIS — E781 Pure hyperglyceridemia: Secondary | ICD-10-CM

## 2021-01-01 DIAGNOSIS — E118 Type 2 diabetes mellitus with unspecified complications: Secondary | ICD-10-CM

## 2021-01-01 DIAGNOSIS — Z794 Long term (current) use of insulin: Secondary | ICD-10-CM

## 2021-01-01 DIAGNOSIS — R9431 Abnormal electrocardiogram [ECG] [EKG]: Secondary | ICD-10-CM

## 2021-01-01 DIAGNOSIS — Z8249 Family history of ischemic heart disease and other diseases of the circulatory system: Secondary | ICD-10-CM

## 2021-01-01 DIAGNOSIS — R809 Proteinuria, unspecified: Secondary | ICD-10-CM

## 2021-01-01 DIAGNOSIS — I10 Essential (primary) hypertension: Secondary | ICD-10-CM

## 2021-01-01 DIAGNOSIS — E1129 Type 2 diabetes mellitus with other diabetic kidney complication: Secondary | ICD-10-CM

## 2021-01-01 MED ORDER — CARVEDILOL 6.25 MG PO TABS: 6.2500 mg | ORAL_TABLET | Freq: Two times a day (BID) | ORAL | 2 refills | Status: DC

## 2021-01-01 MED ORDER — OLMESARTAN MEDOXOMIL 40 MG PO TABS: 40.0000 mg | ORAL_TABLET | Freq: Every day | ORAL | 3 refills | Status: DC

## 2021-01-01 NOTE — Progress Notes (Signed)
Primary Physician/Referring:  Janith Lima, MD  Patient ID: Tim Wilson, male    DOB: 1965/10/20, 55 y.o.   MRN: [id number]  Chief Complaint  Patient presents with  . Abnormal ECG  . New Patient (Initial Visit)    Referred by Dr. Marcello Moores   HPI:    Tim Wilson  is a 55 y.o. Patient referred by Dr. Scarlette Calico for evaluation of abnormal EKG and in view of multiple cardiovascular risk factors.  Past medical history significant for strong family history of premature coronary artery disease in father who had MI at the age of 49 and brother with MI at the age of 85 years of age, hypertension, mixed hyperlipidemia, maturity-onset diabetes in young with hyperglycemia and proteinuria.  Patient states that he is asymptomatic.  Past Medical History:  Diagnosis Date  . Diabetes mellitus   . Hyperlipidemia   . Hypertension    History reviewed. No pertinent surgical history. Family History  Problem Relation Age of Onset  . Heart disease Father   . Heart attack Father 65  . Heart disease Brother   . Heart attack Brother 69  . Diabetes Mother   . Heart attack Mother   . Breast cancer Cousin   . Heart attack Paternal Uncle   . Heart attack Paternal Uncle   . Stroke Paternal Uncle   . Heart attack Paternal Uncle 33       5 HA in the past   . Colon cancer Neg Hx   . Stomach cancer Neg Hx   . Esophageal cancer Neg Hx     Social History   Tobacco Use  . Smoking status: Never Smoker  . Smokeless tobacco: Never Used  Substance Use Topics  . Alcohol use: Yes    Alcohol/week: 2.0 standard drinks    Types: 2 Cans of beer per week    Comment: on occasion, not in excess   Marital Status: Married  ROS  Review of Systems  Cardiovascular: Negative for chest pain, dyspnea on exertion and leg swelling.  Gastrointestinal: Negative for melena.   Objective  Blood pressure 137/89, pulse 89, temperature (!) 97.1 F (36.2 C), temperature source Temporal, resp. rate 17, height 5\' 8"   (1.727 m), weight 196 lb 12.8 oz (89.3 kg), SpO2 98 %.  Vitals with BMI 01/01/2021 12/10/2020 09/09/2020  Height 5\' 8"  5\' 8"  5\' 8"   Weight 196 lbs 13 oz 199 lbs 200 lbs 10 oz  BMI 29.93 59.56 38.75  Systolic 643 329 518  Diastolic 89 92 80  Pulse 89 97 86     Physical Exam Cardiovascular:     Rate and Rhythm: Normal rate and regular rhythm.     Pulses: Intact distal pulses.     Heart sounds: Normal heart sounds. No murmur heard. No gallop.      Comments: No leg edema, no JVD. Pulmonary:     Effort: Pulmonary effort is normal.     Breath sounds: Normal breath sounds.  Abdominal:     General: Bowel sounds are normal.     Palpations: Abdomen is soft.    Laboratory examination:   Recent Labs    03/25/20 1011 12/11/20 1151  NA 135 152*  K 3.9 4.6  CL 98 108  CO2 28 16*  GLUCOSE 336* 375*  BUN 12 13  CREATININE 1.07 1.23  CALCIUM 10.1 11.0*   estimated creatinine clearance is 73.7 mL/min (by C-G formula based on SCr of 1.23 mg/dL).  CMP Latest  Ref Rng & Units 12/11/2020 03/25/2020 11/15/2019  Glucose 65 - 99 mg/dL 375(H) 336(H) 344(H)  BUN 7 - 25 mg/dL 13 12 18   Creatinine 0.70 - 1.33 mg/dL 1.23 1.07 1.28  Sodium 135 - 146 mmol/L 152(H) 135 131(L)  Potassium 3.5 - 5.3 mmol/L 4.6 3.9 4.6  Chloride 98 - 110 mmol/L 108 98 96  CO2 20 - 32 mmol/L 16(L) 28 26  Calcium 8.6 - 10.3 mg/dL 11.0(H) 10.1 10.2  Total Protein 6.1 - 8.1 g/dL 8.9(H) - -  Total Bilirubin 0.2 - 1.2 mg/dL 0.2 - -  Alkaline Phos 39 - 117 U/L - - -  AST 10 - 35 U/L 15 - -  ALT 9 - 46 U/L 16 - -   CBC Latest Ref Rng & Units 12/11/2020 05/15/2019 05/01/2018  WBC 3.8 - 10.8 Thousand/uL 8.5 5.1 6.2  Hemoglobin 13.2 - 17.1 g/dL 14.2 14.3 13.0  Hematocrit 38.5 - 50.0 % 40.2 42.4 38.1(L)  Platelets 140 - 400 Thousand/uL 300 279.0 308.0   Urine dipstick shows negative for all components, positive for protein.   Lipid Panel Recent Labs    03/25/20 1011 12/11/20 1151  CHOL  --  733*  TRIG 1230.0 Triglyceride is  over 400; calculations on Lipids are invalid.* 7,582*  HDL  --  15*  CHOLHDL  --  48.9*   Lipid Panel     Component Value Date/Time   CHOL 733 (H) 12/11/2020 1151   TRIG 7,582 (H) 12/11/2020 1151   HDL 15 (L) 12/11/2020 1151   CHOLHDL 48.9 (H) 12/11/2020 1151   VLDL 48.6 (H) 01/20/2017 1112   LDLCALC  12/11/2020 1151     Comment:     . LDL cholesterol not calculated. Triglyceride levels greater than 400 mg/dL invalidate calculated LDL results. . Reference range: <100 . Desirable range <100 mg/dL for primary prevention;   <70 mg/dL for patients with CHD or diabetic patients  with > or = 2 CHD risk factors. Marland Kitchen LDL-C is now calculated using the Martin-Hopkins  calculation, which is a validated novel method providing  better accuracy than the Friedewald equation in the  estimation of LDL-C.  Cresenciano Genre et al. Annamaria Helling. 1856;314(97): 2061-2068  (http://education.[facility].com/faq/FAQ164)    LDLDIRECT 49.0 05/15/2019 1434     HEMOGLOBIN A1C Lab Results  Component Value Date   HGBA1C 11.9 (H) 12/11/2020   MPG 295 12/11/2020   TSH Recent Labs    12/11/20 1151  TSH 1.24    Medications and allergies   Allergies  Allergen Reactions  . Metformin And Related Diarrhea  . Ramipril     cough     Outpatient Medications Prior to Visit  Medication Sig Note  . aspirin EC 81 MG tablet Take 1 tablet (81 mg total) by mouth daily.   . Cholecalciferol 1.25 MG (50000 UT) capsule Take 1 capsule (50,000 Units total) by mouth once a week.   . Continuous Blood Gluc Receiver (FREESTYLE LIBRE 2 READER) DEVI 1 Act by Does not apply route daily.   . Continuous Blood Gluc Sensor (FREESTYLE LIBRE 2 SENSOR) MISC 1 Act by Does not apply route daily.   . fenofibrate (TRICOR) 145 MG tablet Take 1 tablet (145 mg total) by mouth daily.   . Glucagon (GVOKE HYPOPEN 2-PACK) 1 MG/0.2ML SOAJ Inject 1 Act into the skin daily as needed.   Marland Kitchen glucose blood (ONETOUCH VERIO) test strip 1 each by Other  route 2 (two) times daily. And lancets 2/day   . icosapent Ethyl (  VASCEPA) 1 g capsule Take 2 capsules (2 g total) by mouth 2 (two) times daily.   . insulin glargine, 2 Unit Dial, (TOUJEO MAX SOLOSTAR) 300 UNIT/ML Solostar Pen Inject 100 Units into the skin every morning.   . insulin lispro (HUMALOG KWIKPEN) 200 UNIT/ML KwikPen Inject 10 Units into the skin with breakfast, with lunch, and with evening meal.   . rosuvastatin (CRESTOR) 20 MG tablet Take 1 tablet (20 mg total) by mouth daily.   . TECHLITE PEN NEEDLES 32G X 6 MM MISC USE TO INJECT TOUJEO   . [DISCONTINUED] olmesartan (BENICAR) 20 MG tablet Take 1 tablet (20 mg total) by mouth daily. 01/01/2021: Increase  . amLODipine (NORVASC) 5 MG tablet Take 1 tablet (5 mg total) by mouth daily. (Patient not taking: Reported on 01/01/2021)   . torsemide (DEMADEX) 10 MG tablet Take 1 tablet (10 mg total) by mouth daily. (Patient not taking: Reported on 01/01/2021)    No facility-administered medications prior to visit.    Radiology:   No results found.   Cardiac Studies:     EKG:     EKG 01/01/2021: Normal sinus rhythm at rate of 81 bpm, left atrial enlargement, left axis deviation, left anterior fascicular block.  IVCD, borderline criteria for LVH.  No significant change from 12/10/2020.  Previously low-voltage complexes.  Poor R wave progression.  Assessment     ICD-10-CM   1. Abnormal EKG  R94.31 PCV ECHOCARDIOGRAM COMPLETE    PCV MYOCARDIAL PERFUSION WO LEXISCAN    CT CARDIAC SCORING (SELF PAY ONLY)  2. Pure hypertriglyceridemia  E78.1 Lipid Panel With LDL/HDL Ratio    Lipid Panel With LDL/HDL Ratio  3. Primary hypertension  I10 EKG 12-Lead    carvedilol (COREG) 6.25 MG tablet    Basic metabolic panel    Basic metabolic panel  4. Family history of premature coronary artery disease. Brother and father MI at age 48s.  Z82.49 CT CARDIAC SCORING (SELF PAY ONLY)  5. Type 2 diabetes mellitus with microalbuminuria, with long-term current  use of insulin (HCC)  E11.29    R80.9    Z79.4   6. Essential hypertension  I10   7. Type 2 diabetes mellitus with complication, with long-term current use of insulin (HCC)  E11.8 olmesartan (BENICAR) 40 MG tablet   Z79.4   8. Proteinuria, unspecified type  R80.9     Medications Discontinued During This Encounter  Medication Reason  . olmesartan (BENICAR) 20 MG tablet Dose change    Meds ordered this encounter  Medications  . olmesartan (BENICAR) 40 MG tablet    Sig: Take 1 tablet (40 mg total) by mouth daily.    Dispense:  90 tablet    Refill:  3  . carvedilol (COREG) 6.25 MG tablet    Sig: Take 1 tablet (6.25 mg total) by mouth 2 (two) times daily.    Dispense:  60 tablet    Refill:  2    Orders Placed This Encounter  Procedures  . CT CARDIAC SCORING (SELF PAY ONLY)    Standing Status:   Future    Standing Expiration Date:   03/03/2021    Order Specific Question:   Preferred imaging location?    Answer:   [facility]    Order Specific Question:   Radiology Contrast Protocol - do NOT remove file path    Answer:   [email or web address]  . Basic metabolic panel    Standing Status:   Future    Number of Occurrences:  1    Standing Expiration Date:   01/01/2022  . Lipid Panel With LDL/HDL Ratio    Standing Status:   Future    Number of Occurrences:   1    Standing Expiration Date:   01/01/2022  . PCV MYOCARDIAL PERFUSION WO LEXISCAN    Standing Status:   Future    Standing Expiration Date:   03/03/2021  . EKG 12-Lead  . PCV ECHOCARDIOGRAM COMPLETE    Standing Status:   Future    Standing Expiration Date:   01/01/2022   Recommendations:   Tim Wilson is a 55 y.o. Patient referred by Dr. Scarlette Calico for evaluation of abnormal EKG and in view of multiple cardiovascular risk factors.  Past medical history significant for strong family history of premature coronary artery disease in father who had MI at the age of 2 and brother  with MI at the age of 53 years of age, hypertension, mixed hyperlipidemia, maturity-onset diabetes in young with hyperglycemia and proteinuria.  Patient is a Careers adviser in [employer], states that he remains busy and states that he has not had any chest pain or shortness of breath.  On further questioning, he has very poor lifestyle without being aware of this, drinks green tea and iced tea every day, Frappuccino's regularly, occasional chips.  He also drinks juices.  I will recheck his triglycerides/lipid profile in 1 month to see lifestyle changes has any effect.  I suspect he probably has primary hypertriglyceridemia and may need further evaluation from metabolic standpoint.  Hopefully with the lifestyle changes, diabetes will improve along with improvement in lipids.  He also has been diagnosed with hypercalcemia, has been referred to Dr. Renato Shin for evaluation of the same.  Extensive coaching was performed regarding this and primary hypertriglyceridemia and his cardiovascular risk.  Although asymptomatic, he is extremely high risk for future cardiovascular events.  I will set him up for coronary calcium scoring. Schedule for a Exercise Nuclear stress test to evaluate for myocardial ischemia. Will schedule for an echocardiogram.   We will increase his Benicar for hypertension control, goal blood pressure closer to [phone] systolic over 75 mmHg diastolic.  We will add carvedilol 6.25 mg p.o. twice daily.  Patient has not been taking amlodipine, I have not restarted this but he may need this as well.  He is also not taking torsemide, states that it causes excessive diuresis.  I have not started this.  Compliance with medications and diet discussed.  Patient appears to be very motivated.  I also discussed with him regarding 15 pound weight loss.  I had like to see him back in 6 weeks for follow-up.  Is a 60-minute encounter office visit.    Adrian Prows, MD, [facility] 01/01/2021, 11:40  AM Office: [phone]

## 2021-01-03 ENCOUNTER — Other Ambulatory Visit (HOSPITAL_BASED_OUTPATIENT_CLINIC_OR_DEPARTMENT_OTHER): Payer: Self-pay

## 2021-01-03 MED FILL — Carvedilol Tab 6.25 MG: ORAL | 30 days supply | Qty: 60 | Fill #0 | Status: AC

## 2021-01-03 MED FILL — Olmesartan Medoxomil Tab 40 MG: ORAL | 30 days supply | Qty: 30 | Fill #0 | Status: AC

## 2021-01-12 ENCOUNTER — Ambulatory Visit: Payer: Managed Care, Other (non HMO)

## 2021-01-12 ENCOUNTER — Other Ambulatory Visit: Payer: Self-pay

## 2021-01-12 DIAGNOSIS — R9431 Abnormal electrocardiogram [ECG] [EKG]: Secondary | ICD-10-CM

## 2021-01-13 NOTE — Progress Notes (Signed)
Normal stress test

## 2021-01-14 ENCOUNTER — Other Ambulatory Visit: Payer: Self-pay

## 2021-01-14 ENCOUNTER — Other Ambulatory Visit (HOSPITAL_BASED_OUTPATIENT_CLINIC_OR_DEPARTMENT_OTHER): Payer: Self-pay

## 2021-01-14 ENCOUNTER — Ambulatory Visit: Payer: Managed Care, Other (non HMO)

## 2021-01-14 DIAGNOSIS — R9431 Abnormal electrocardiogram [ECG] [EKG]: Secondary | ICD-10-CM

## 2021-01-14 MED FILL — Rosuvastatin Calcium Tab 20 MG: ORAL | 30 days supply | Qty: 30 | Fill #0 | Status: AC

## 2021-01-14 MED FILL — Fenofibrate Tab 145 MG: ORAL | 30 days supply | Qty: 30 | Fill #0 | Status: AC

## 2021-01-14 NOTE — Progress Notes (Signed)
Called and left stress test results on Vmbox.

## 2021-01-30 NOTE — Progress Notes (Signed)
Called patient, NA, left echo results on VM box.

## 2021-02-05 ENCOUNTER — Ambulatory Visit: Payer: Managed Care, Other (non HMO) | Admitting: Podiatry

## 2021-02-12 ENCOUNTER — Encounter: Payer: Self-pay | Admitting: Cardiology

## 2021-02-12 ENCOUNTER — Other Ambulatory Visit: Payer: Self-pay

## 2021-02-12 ENCOUNTER — Ambulatory Visit: Payer: Managed Care, Other (non HMO) | Admitting: Cardiology

## 2021-02-12 ENCOUNTER — Other Ambulatory Visit (HOSPITAL_BASED_OUTPATIENT_CLINIC_OR_DEPARTMENT_OTHER): Payer: Self-pay

## 2021-02-12 VITALS — BP 119/78 | HR 77 | Temp 98.4°F | Resp 17 | Ht 68.0 in | Wt 188.0 lb

## 2021-02-12 DIAGNOSIS — E1129 Type 2 diabetes mellitus with other diabetic kidney complication: Secondary | ICD-10-CM

## 2021-02-12 DIAGNOSIS — E781 Pure hyperglyceridemia: Secondary | ICD-10-CM

## 2021-02-12 DIAGNOSIS — R9431 Abnormal electrocardiogram [ECG] [EKG]: Secondary | ICD-10-CM

## 2021-02-12 DIAGNOSIS — Z8249 Family history of ischemic heart disease and other diseases of the circulatory system: Secondary | ICD-10-CM

## 2021-02-12 DIAGNOSIS — I10 Essential (primary) hypertension: Secondary | ICD-10-CM

## 2021-02-12 DIAGNOSIS — R809 Proteinuria, unspecified: Secondary | ICD-10-CM

## 2021-02-12 MED ORDER — METOPROLOL SUCCINATE ER 25 MG PO TB24
12.5000 mg | ORAL_TABLET | Freq: Every day | ORAL | 3 refills | Status: DC
  Filled 2021-02-12: qty 15, 30d supply, fill #0
  Filled 2021-03-26: qty 15, 30d supply, fill #1
  Filled 2021-06-17: qty 15, 30d supply, fill #2
  Filled 2021-08-03: qty 15, 30d supply, fill #3
  Filled 2021-10-14: qty 15, 30d supply, fill #4
  Filled 2021-12-24: qty 15, 30d supply, fill #5

## 2021-02-12 NOTE — Progress Notes (Signed)
Primary Physician/Referring:  Janith Lima, MD  Patient ID: Tim Wilson, male    DOB: Dec 28, 1965, 55 y.o.   MRN: [id number]  Chief Complaint  Patient presents with  . Follow-up    6 WEEK  . Abnormal ECG  . Hypertension  . Hyperlipidemia   HPI:    ARCANGEL KALISCH  is a 55 y.o. patient originally referred by Dr. Scarlette Calico for evaluation of abnormal EKG and in view of multiple cardiovascular risk factors.  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  Patient also has strong family history of premature CAD in father with MI at age 64 and brother with MI at age 42.  Patient also follows with Dr. Renato Shin for hypercalcemia.  Patient presents for 6-week follow-up and results of cardiac testing. Patient has unfortunately not had repeat BMP or lipid profile testing and has not been scheduled for coronary calcium score. At last visit increase Benicar to 40 mg daily and added carvedilol.  Patient reports he has stopped carvedilol as he felt it was causing him body aches and palpitations.  Patient otherwise is doing well and is asymptomatic.  He reports he has made significant changes to his diet and lifestyle avoiding high fat, high salt, carbohydrates, and sweets.  Past Medical History:  Diagnosis Date  . Diabetes mellitus   . Hyperlipidemia   . Hypertension    History reviewed. No pertinent surgical history. Family History  Problem Relation Age of Onset  . Heart disease Father   . Heart attack Father 47  . Heart disease Brother   . Heart attack Brother 35  . Diabetes Mother   . Heart attack Mother   . Breast cancer Cousin   . Heart attack Paternal Uncle   . Heart attack Paternal Uncle   . Stroke Paternal Uncle   . Heart attack Paternal Uncle 38       5 HA in the past   . Colon cancer Neg Hx   . Stomach cancer Neg Hx   . Esophageal cancer Neg Hx     Social History    Tobacco Use  . Smoking status: Never Smoker  . Smokeless tobacco: Never Used  Substance Use Topics  . Alcohol use: Yes    Alcohol/week: 2.0 standard drinks    Types: 2 Cans of beer per week    Comment: on occasion, not in excess   Marital Status: Married  ROS  Review of Systems  Constitutional: Negative for malaise/fatigue and weight gain.  Cardiovascular: Negative for chest pain, claudication, dyspnea on exertion, leg swelling, near-syncope, orthopnea, palpitations, paroxysmal nocturnal dyspnea and syncope.  Respiratory: Negative for shortness of breath.   Hematologic/Lymphatic: Does not bruise/bleed easily.  Gastrointestinal: Negative for melena.  Neurological: Negative for dizziness and weakness.   Objective  Blood pressure 119/78, pulse 77, temperature 98.4 F (36.9 C), temperature source Temporal, resp. rate 17, height 5\' 8"  (1.727 m), weight 188 lb (85.3 kg), SpO2 98 %.  Vitals with BMI 02/12/2021 01/01/2021 12/10/2020  Height 5\' 8"  5\' 8"  5\' 8"   Weight 188 lbs 196 lbs 13 oz 199 lbs  BMI 28.59 123456 [id number]  Systolic 123456 0000000 0000000  Diastolic 78 89 92  Pulse 77 89 97     Physical Exam Vitals reviewed.  HENT:     Head: Normocephalic and atraumatic.  Cardiovascular:     Rate and Rhythm: Normal rate and regular rhythm.  Pulses: Intact distal pulses.     Heart sounds: Normal heart sounds, S1 normal and S2 normal. No murmur heard. No gallop.      Comments: No leg edema, no JVD. Pulmonary:     Effort: Pulmonary effort is normal. No respiratory distress.     Breath sounds: Normal breath sounds. No wheezing, rhonchi or rales.  Abdominal:     General: Bowel sounds are normal.     Palpations: Abdomen is soft.  Musculoskeletal:     Right lower leg: No edema.     Left lower leg: No edema.  Neurological:     Mental Status: He is alert.    Laboratory examination:   Recent Labs    03/25/20 1011 12/11/20 1151  NA 135 152*  K 3.9 4.6  CL 98 108  CO2 28 16*  GLUCOSE  336* 375*  BUN 12 13  CREATININE 1.07 1.23  CALCIUM 10.1 11.0*   CrCl cannot be calculated (Patient's most recent lab result is older than the maximum 21 days allowed.).  CMP Latest Ref Rng & Units 12/11/2020 03/25/2020 11/15/2019  Glucose 65 - 99 mg/dL 375(H) 336(H) 344(H)  BUN 7 - 25 mg/dL 13 12 18   Creatinine 0.70 - 1.33 mg/dL 1.23 1.07 1.28  Sodium 135 - 146 mmol/L 152(H) 135 131(L)  Potassium 3.5 - 5.3 mmol/L 4.6 3.9 4.6  Chloride 98 - 110 mmol/L 108 98 96  CO2 20 - 32 mmol/L 16(L) 28 26  Calcium 8.6 - 10.3 mg/dL 11.0(H) 10.1 10.2  Total Protein 6.1 - 8.1 g/dL 8.9(H) - -  Total Bilirubin 0.2 - 1.2 mg/dL 0.2 - -  Alkaline Phos 39 - 117 U/L - - -  AST 10 - 35 U/L 15 - -  ALT 9 - 46 U/L 16 - -   CBC Latest Ref Rng & Units 12/11/2020 05/15/2019 05/01/2018  WBC 3.8 - 10.8 Thousand/uL 8.5 5.1 6.2  Hemoglobin 13.2 - 17.1 g/dL 14.2 14.3 13.0  Hematocrit 38.5 - 50.0 % 40.2 42.4 38.1(L)  Platelets 140 - 400 Thousand/uL 300 279.0 308.0   Urine dipstick shows negative for all components, positive for protein.   Lipid Panel Recent Labs    03/25/20 1011 12/11/20 1151  CHOL  --  733*  TRIG 1230.0 Triglyceride is over 400; calculations on Lipids are invalid.* 7,582*  HDL  --  15*  CHOLHDL  --  48.9*   Lipid Panel     Component Value Date/Time   CHOL 733 (H) 12/11/2020 1151   TRIG 7,582 (H) 12/11/2020 1151   HDL 15 (L) 12/11/2020 1151   CHOLHDL 48.9 (H) 12/11/2020 1151   VLDL 48.6 (H) 01/20/2017 1112   LDLCALC  12/11/2020 1151     Comment:     . LDL cholesterol not calculated. Triglyceride levels greater than 400 mg/dL invalidate calculated LDL results. . Reference range: <100 . Desirable range <100 mg/dL for primary prevention;   <70 mg/dL for patients with CHD or diabetic patients  with > or = 2 CHD risk factors. Marland Kitchen LDL-C is now calculated using the Martin-Hopkins  calculation, which is a validated novel method providing  better accuracy than the Friedewald equation in the   estimation of LDL-C.  Cresenciano Genre et al. Annamaria Helling. 0630;160(10): 2061-2068  (http://education.[facility].com/faq/FAQ164)    LDLDIRECT 49.0 05/15/2019 1434     HEMOGLOBIN A1C Lab Results  Component Value Date   HGBA1C 11.9 (H) 12/11/2020   MPG 295 12/11/2020   TSH Recent Labs    12/11/20  1151  TSH 1.24    Medications and allergies   Allergies  Allergen Reactions  . Metformin And Related Diarrhea  . Ramipril     cough     Outpatient Medications Prior to Visit  Medication Sig  . aspirin EC 81 MG tablet Take 1 tablet (81 mg total) by mouth daily.  . Cholecalciferol 1.25 MG (50000 UT) capsule Take 1 capsule (50,000 Units total) by mouth once a week.  . Continuous Blood Gluc Receiver (FREESTYLE LIBRE 2 READER) DEVI USE AS DIRECTED  . Continuous Blood Gluc Sensor (FREESTYLE LIBRE 2 SENSOR) MISC USE AS DIRECTED  . fenofibrate (TRICOR) 145 MG tablet TAKE 1 TABLET BY MOUTH ONCE DAILY  . Glucagon 1 MG/0.2ML SOAJ INJECT UNDER THE SKIN INTO THE SKIN DAILY AS NEEDED  . glucose blood (ONETOUCH VERIO) test strip 1 each by Other route 2 (two) times daily. And lancets 2/day  . icosapent Ethyl (VASCEPA) 1 g capsule TAKE 2 CAPSULES (2 G TOTAL) BY MOUTH 2 (TWO) TIMES DAILY.  Marland Kitchen insulin glargine, 2 Unit Dial, (TOUJEO MAX) 300 UNIT/ML Solostar Pen INJECT 100 UNITS INTO THE SKIN EVERY MORNING.  . insulin lispro (HUMALOG) 200 UNIT/ML KwikPen INJECT 10 UNITS INTO THE SKIN WITH BREAKFAST, LUNCH, AND EVENING MEAL  . Insulin Pen Needle 32G X 6 MM MISC USE TO INJECT TOUJEO  . Insulin Pen Needle 32G X 6 MM MISC USE TO INJECT TOUJEO  . olmesartan (BENICAR) 40 MG tablet TAKE 1 TABLET (40 MG TOTAL) BY MOUTH DAILY.  . rosuvastatin (CRESTOR) 20 MG tablet TAKE 1 TABLET BY MOUTH ONCE DAILY  . [DISCONTINUED] amLODipine (NORVASC) 5 MG tablet TAKE 1 TABLET (5 MG TOTAL) BY MOUTH DAILY.  . [DISCONTINUED] carvedilol (COREG) 6.25 MG tablet TAKE 1 TABLET (6.25 MG TOTAL) BY MOUTH 2 (TWO) TIMES DAILY.  .  [DISCONTINUED] clotrimazole-betamethasone (LOTRISONE) cream APPLY TO AFFECTED AREA THREE TIMES DAILY FOR 10-14 DAYS  . [DISCONTINUED] torsemide (DEMADEX) 10 MG tablet TAKE 1 TABLET (10 MG TOTAL) BY MOUTH DAILY. (Patient not taking: No sig reported)   No facility-administered medications prior to visit.    Radiology:   No results found.   Cardiac Studies:   PCV MYOCARDIAL PERFUSION WO LEXISCAN 01/12/2021 Exercise nuclear stress test was performed using Bruce protocol. Patient reached 11.3 METS, and 92% of age predicted maximum heart rate. Exercise capacity was excellent. No chest pain reported. Heart rate and hemodynamic response were normal. Stress EKG revealed no ischemic changes. Mild decrease in tracer uptake in both rest and stress images likely due to tissue attenuation. Stress LVEF calculated 49%, although visually looks normal. Low risk study.  PCV ECHOCARDIOGRAM COMPLETE 61/44/3154 Normal LV systolic function with visual EF 55-60%. Left ventricle cavity is normal in size. Normal global wall motion. Normal diastolic filling pattern, normal LAP. No significant valvular heart disease. No prior study for comparison.   EKG:   EKG 01/01/2021: Normal sinus rhythm at rate of 81 bpm, left atrial enlargement, left axis deviation, left anterior fascicular block.  IVCD, borderline criteria for LVH.  No significant change from 12/10/2020.  Previously low-voltage complexes.  Poor R wave progression.  Assessment     ICD-10-CM   1. Pure hypertriglyceridemia  E78.1   2. Primary hypertension  I10   3. Family history of premature coronary artery disease. Brother and father MI at age 16s.  Z82.49   4. Type 2 diabetes mellitus with microalbuminuria, with long-term current use of insulin (HCC)  E11.29    R80.9  Z79.4   5. Abnormal EKG  R94.31     Medications Discontinued During This Encounter  Medication Reason  . amLODipine (NORVASC) 5 MG tablet Error  . carvedilol (COREG) 6.25 MG tablet  Error  . clotrimazole-betamethasone (LOTRISONE) cream Error  . torsemide (DEMADEX) 10 MG tablet Error    Meds ordered this encounter  Medications  . metoprolol succinate (TOPROL-XL) 25 MG 24 hr tablet    Sig: Take 0.5 tablets (12.5 mg total) by mouth daily. Take with or immediately following a meal.    Dispense:  45 tablet    Refill:  3    No orders of the defined types were placed in this encounter.  Recommendations:   JAEVION GOTO is a 55 y.o. patient originally referred by Dr. Scarlette Calico for evaluation of abnormal EKG and in view of multiple cardiovascular risk factors.  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  Patient also has strong family history of premature CAD in father with MI at age 45 and brother with MI at age 31.  Patient also follows with Dr. Renato Shin for hypercalcemia.  Patient is a Biochemist, clinical for [employer] and remains active and busy without chest pain or shortness of breath.  He remains asymptomatic.  He has made significant diet and lifestyle modifications since last visit including avoiding sugar, carbohydrates, high fat and high salt foods.  He continues to follow with Dr. Loanne Drilling for management of hypercalcemia and diabetes.  Reviewed and discussed with patient regarding results of echocardiogram and stress test, details above.  Echocardiogram was normal and stress test low risk.  However patient does have multiple cardiovascular risk factors and significant family history of premature CAD.  Therefore advised patient to proceed with coronary calcium score, he is agreeable.  Also will repeat BMP and lipid profile testing.  In view of low risk stress test, if coronary calcium score is normal patient may not need to be on aspirin in the future, will repeat reevaluate next visit.  In regard to hypertension, it is well controlled with increased  dose of Benicar, despite patient discontinuing carvedilol.  However patient would likely benefit from beta-blocker therapy given cardiovascular risk factors and family history.  We will discontinue carvedilol in view of side effects including body aches and palpitations.  We will start patient on low-dose metoprolol succinate 12.5 mg once daily.  Counseled patient regarding medication profile and side effects advised him to notify our office immediately if he experiences symptoms of hypotension, he verbalized understanding and agreement.  Patient is congratulated as he has lost approximately 11 pounds since March, encouraged him to continue to do so.  Follow-up in 3 months, sooner if needed, for cardiovascular risk management, hypertension, mixed hyperlipidemia.    Alethia Berthold, PA-C 02/12/2021, 10:14 AM Office: [phone]

## 2021-02-18 ENCOUNTER — Other Ambulatory Visit (HOSPITAL_BASED_OUTPATIENT_CLINIC_OR_DEPARTMENT_OTHER): Payer: Self-pay

## 2021-02-18 MED FILL — Rosuvastatin Calcium Tab 20 MG: ORAL | 30 days supply | Qty: 30 | Fill #1 | Status: AC

## 2021-02-18 MED FILL — Olmesartan Medoxomil Tab 40 MG: ORAL | 30 days supply | Qty: 30 | Fill #1 | Status: AC

## 2021-02-18 MED FILL — Fenofibrate Tab 145 MG: ORAL | 30 days supply | Qty: 30 | Fill #1 | Status: AC

## 2021-02-19 ENCOUNTER — Other Ambulatory Visit (HOSPITAL_BASED_OUTPATIENT_CLINIC_OR_DEPARTMENT_OTHER): Payer: Self-pay

## 2021-03-26 ENCOUNTER — Other Ambulatory Visit (HOSPITAL_BASED_OUTPATIENT_CLINIC_OR_DEPARTMENT_OTHER): Payer: Self-pay

## 2021-03-26 ENCOUNTER — Other Ambulatory Visit: Payer: Self-pay | Admitting: Internal Medicine

## 2021-03-26 DIAGNOSIS — E781 Pure hyperglyceridemia: Secondary | ICD-10-CM

## 2021-03-26 MED ORDER — FENOFIBRATE 145 MG PO TABS
ORAL_TABLET | Freq: Every day | ORAL | 0 refills | Status: DC
  Filled 2021-03-26: qty 30, 30d supply, fill #0
  Filled 2021-06-17: qty 30, 30d supply, fill #1
  Filled 2021-08-03: qty 30, 30d supply, fill #2

## 2021-03-26 MED FILL — Olmesartan Medoxomil Tab 40 MG: ORAL | 30 days supply | Qty: 30 | Fill #2 | Status: AC

## 2021-03-26 MED FILL — Rosuvastatin Calcium Tab 20 MG: ORAL | 30 days supply | Qty: 30 | Fill #2 | Status: AC

## 2021-04-20 ENCOUNTER — Ambulatory Visit: Payer: Managed Care, Other (non HMO) | Admitting: Podiatry

## 2021-04-23 ENCOUNTER — Encounter: Payer: Self-pay | Admitting: Podiatry

## 2021-04-23 ENCOUNTER — Other Ambulatory Visit: Payer: Self-pay

## 2021-04-23 ENCOUNTER — Ambulatory Visit: Payer: Managed Care, Other (non HMO) | Admitting: Podiatry

## 2021-04-23 ENCOUNTER — Ambulatory Visit (INDEPENDENT_AMBULATORY_CARE_PROVIDER_SITE_OTHER): Payer: Managed Care, Other (non HMO)

## 2021-04-23 DIAGNOSIS — M7752 Other enthesopathy of left foot: Secondary | ICD-10-CM

## 2021-04-23 DIAGNOSIS — S86012A Strain of left Achilles tendon, initial encounter: Secondary | ICD-10-CM | POA: Diagnosis not present

## 2021-04-23 DIAGNOSIS — M7750 Other enthesopathy of unspecified foot: Secondary | ICD-10-CM

## 2021-04-27 NOTE — Progress Notes (Addendum)
  Subjective:  Patient ID: Tim Wilson, male    DOB: Jun 06, 1966,  MRN: [id number]  Chief Complaint  Patient presents with   Foot Pain     (xrays)(np) left foot achillies pain    55 y.o. male presents with the above complaint. History confirmed with patient.  he has about 3 months ago he felt something pop in his Achilles while he was playing basketball.  he again felt a pop last week and was quite painful at the time but it is improving.  Feels like a burning sensation.  Feels like he has weakness and decreased range of motion on that side  Objective:  Physical Exam: warm, good capillary refill, no trophic changes or ulcerative lesions, normal DP and PT pulses, and normal sensory exam. Left Foot: Palpable fullness and not within the mid substance of the Achilles tendon, he has 4+ out of 5 strength on the side compared to the right side and plantar flexion  Radiographs: Multiple views x-ray of the left foot: no fracture, dislocation, swelling or degenerative changes noted he does have a slight Haglund's deformity Assessment:   1. Partial tear of left Achilles tendon, initial encounter      Plan:  Patient was evaluated and treated and all questions answered.  Reviewed the radiographic clinical findings with patient.  I think he has had 2 partial tendon tears at this point.  his A1c is quite high at 11.9% so surgical intervention could be risky.  I recommended MRI to evaluate the severity of any tearing and degenerative changes in the event surgery is required in the future.  For now WBAT in regular shoe gear cautioned his to be careful with impact exercise which I discussed he should avoid and be careful with loading impact to the foot that would tension the tendon to increase risk of rerupture.  Return in about 4 weeks (around 05/21/2021) for after MRI to review.

## 2021-05-12 ENCOUNTER — Other Ambulatory Visit: Payer: Self-pay | Admitting: Student

## 2021-05-12 ENCOUNTER — Telehealth: Payer: Self-pay

## 2021-05-12 DIAGNOSIS — Z8249 Family history of ischemic heart disease and other diseases of the circulatory system: Secondary | ICD-10-CM

## 2021-05-12 NOTE — Telephone Encounter (Signed)
If he I still willing he should have test done before visit. I have place order.

## 2021-05-12 NOTE — Telephone Encounter (Signed)
Pt called bc he thought he was suppose to have some testing done before pending appt Fri 05/15/21. Upon checking I see an order for coronary calcium score that expired. Does he still need this done and if so please put in new order and should we push f/u appt out

## 2021-05-14 NOTE — Progress Notes (Deleted)
Primary Physician/Referring:  Janith Lima, MD  Patient ID: Tim Wilson, male    DOB: 10-Aug-1966, 55 y.o.   MRN: [id number]  No chief complaint on file.  HPI:    Tim Wilson  is a 55 y.o. patient originally referred by Dr. Scarlette Calico for evaluation of abnormal EKG and in view of multiple cardiovascular risk factors.  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  Patient also has strong family history of premature CAD in father with MI at age 81 and brother with MI at age 37.  Patient also follows with Dr. Renato Shin for hypercalcemia.  Patient presents for 55-monthfollow-up of hypertension and hyperlipidemia.  At last visit ordered coronary calcium score, repeat BMP and lipid profile testing.  Also at last visit switch patient from carvedilol to metoprolol succinate 12.5 mg daily as patient was experiencing dizziness with carvedilol.***  ***  Patient presents for 6-week follow-up and results of cardiac testing. Patient has unfortunately not had repeat BMP or lipid profile testing and has not been scheduled for coronary calcium score. At last visit increase Benicar to 40 mg daily and added carvedilol.  Patient reports he has stopped carvedilol as he felt it was causing him body aches and palpitations.  Patient otherwise is doing well and is asymptomatic.  He reports he has made significant changes to his diet and lifestyle avoiding high fat, high salt, carbohydrates, and sweets.  Past Medical History:  Diagnosis Date   Diabetes mellitus    Hyperlipidemia    Hypertension    No past surgical history on file. Family History  Problem Relation Age of Onset   Heart disease Father    Heart attack Father 480  Heart disease Brother    Heart attack Brother 358  Diabetes Mother    Heart attack Mother    Breast cancer Cousin    Heart attack Paternal Uncle    Heart attack Paternal  Uncle    Stroke Paternal Uncle    Heart attack Paternal Uncle 882      5 HA in the past    Colon cancer Neg Hx    Stomach cancer Neg Hx    Esophageal cancer Neg Hx     Social History   Tobacco Use   Smoking status: Never   Smokeless tobacco: Never  Substance Use Topics   Alcohol use: Yes    Alcohol/week: 2.0 standard drinks    Types: 2 Cans of beer per week    Comment: on occasion, not in excess   Marital Status: Married  ROS  Review of Systems  Constitutional: Negative for malaise/fatigue and weight gain.  Cardiovascular:  Negative for chest pain, claudication, dyspnea on exertion, leg swelling, near-syncope, orthopnea, palpitations, paroxysmal nocturnal dyspnea and syncope.  Respiratory:  Negative for shortness of breath.   Hematologic/Lymphatic: Does not bruise/bleed easily.  Gastrointestinal:  Negative for melena.  Neurological:  Negative for dizziness and weakness.  Objective  There were no vitals taken for this visit.  Vitals with BMI 02/12/2021 01/01/2021 12/10/2020  Height '5\' 8"'$  '5\' 8"'$  '5\' 8"'$   Weight 188 lbs 196 lbs 13 oz 199 lbs  BMI 28.59 [id number] Systolic [id number] Diastolic 78 89 92  Pulse 77 89 97     Physical Exam Vitals reviewed.  HENT:     Head: Normocephalic and atraumatic.  Cardiovascular:     Rate and Rhythm: Normal  rate and regular rhythm.     Pulses: Intact distal pulses.     Heart sounds: Normal heart sounds, S1 normal and S2 normal. No murmur heard.   No gallop.     Comments: No leg edema, no JVD. Pulmonary:     Effort: Pulmonary effort is normal. No respiratory distress.     Breath sounds: Normal breath sounds. No wheezing, rhonchi or rales.  Abdominal:     General: Bowel sounds are normal.     Palpations: Abdomen is soft.  Musculoskeletal:     Right lower leg: No edema.     Left lower leg: No edema.  Neurological:     Mental Status: He is alert.   Laboratory examination:   Recent Labs    12/11/20 1151  NA 152*  K 4.6  CL 108   CO2 16*  GLUCOSE 375*  BUN 13  CREATININE 1.23  CALCIUM 11.0*    CrCl cannot be calculated (Patient's most recent lab result is older than the maximum 21 days allowed.).  CMP Latest Ref Rng & Units 12/11/2020 03/25/2020 11/15/2019  Glucose 65 - 99 mg/dL 375(H) 336(H) 344(H)  BUN 7 - 25 mg/dL '13 12 18  '$ Creatinine 0.70 - 1.33 mg/dL 1.23 1.07 1.28  Sodium 135 - 146 mmol/L 152(H) 135 131(L)  Potassium 3.5 - 5.3 mmol/L 4.6 3.9 4.6  Chloride 98 - 110 mmol/L 108 98 96  CO2 20 - 32 mmol/L 16(L) 28 26  Calcium 8.6 - 10.3 mg/dL 11.0(H) 10.1 10.2  Total Protein 6.1 - 8.1 g/dL 8.9(H) - -  Total Bilirubin 0.2 - 1.2 mg/dL 0.2 - -  Alkaline Phos 39 - 117 U/L - - -  AST 10 - 35 U/L 15 - -  ALT 9 - 46 U/L 16 - -   CBC Latest Ref Rng & Units 12/11/2020 05/15/2019 05/01/2018  WBC 3.8 - 10.8 Thousand/uL 8.5 5.1 6.2  Hemoglobin 13.2 - 17.1 g/dL 14.2 14.3 13.0  Hematocrit 38.5 - 50.0 % 40.2 42.4 38.1(L)  Platelets 140 - 400 Thousand/uL 300 279.0 308.0   Urine dipstick shows negative for all components, positive for protein.   Lipid Panel Recent Labs    12/11/20 1151  CHOL 733*  TRIG 7,582*  HDL 15*  CHOLHDL 48.9*    Lipid Panel     Component Value Date/Time   CHOL 733 (H) 12/11/2020 1151   TRIG 7,582 (H) 12/11/2020 1151   HDL 15 (L) 12/11/2020 1151   CHOLHDL 48.9 (H) 12/11/2020 1151   VLDL 48.6 (H) 01/20/2017 1112   LDLCALC  12/11/2020 1151     Comment:     . LDL cholesterol not calculated. Triglyceride levels greater than 400 mg/dL invalidate calculated LDL results. . Reference range: <100 . Desirable range <100 mg/dL for primary prevention;   <70 mg/dL for patients with CHD or diabetic patients  with > or = 2 CHD risk factors. Marland Kitchen LDL-C is now calculated using the Martin-Hopkins  calculation, which is a validated novel method providing  better accuracy than the Friedewald equation in the  estimation of LDL-C.  Cresenciano Genre et al. Annamaria Helling. [id number]): 2061-2068   (http://education.[facility].com/faq/FAQ164)    LDLDIRECT 49.0 05/15/2019 1434     HEMOGLOBIN A1C Lab Results  Component Value Date   HGBA1C 11.9 (H) 12/11/2020   MPG 295 12/11/2020   TSH Recent Labs    12/11/20 1151  TSH 1.24     Medications and allergies   Allergies  Allergen Reactions   Metformin And  Related Diarrhea   Ramipril     cough     Outpatient Medications Prior to Visit  Medication Sig   aspirin EC 81 MG tablet Take 1 tablet (81 mg total) by mouth daily.   Cholecalciferol 1.25 MG (50000 UT) capsule Take 1 capsule (50,000 Units total) by mouth once a week.   Continuous Blood Gluc Receiver (FREESTYLE LIBRE 2 READER) DEVI USE AS DIRECTED   Continuous Blood Gluc Sensor (FREESTYLE LIBRE 2 SENSOR) MISC USE AS DIRECTED   fenofibrate (TRICOR) 145 MG tablet TAKE 1 TABLET BY MOUTH ONCE DAILY   Glucagon 1 MG/0.2ML SOAJ INJECT UNDER THE SKIN INTO THE SKIN DAILY AS NEEDED   glucose blood (ONETOUCH VERIO) test strip 1 each by Other route 2 (two) times daily. And lancets 2/day   icosapent Ethyl (VASCEPA) 1 g capsule TAKE 2 CAPSULES (2 G TOTAL) BY MOUTH 2 (TWO) TIMES DAILY.   insulin glargine, 2 Unit Dial, (TOUJEO MAX) 300 UNIT/ML Solostar Pen INJECT 100 UNITS INTO THE SKIN EVERY MORNING.   insulin lispro (HUMALOG) 200 UNIT/ML KwikPen INJECT 10 UNITS INTO THE SKIN WITH BREAKFAST, LUNCH, AND EVENING MEAL   Insulin Pen Needle 32G X 6 MM MISC USE TO INJECT TOUJEO   Insulin Pen Needle 32G X 6 MM MISC USE TO INJECT TOUJEO   metoprolol succinate (TOPROL-XL) 25 MG 24 hr tablet Take one-half tablet (12.5 mg total) by mouth daily. Take with or immediately following a meal.   olmesartan (BENICAR) 40 MG tablet TAKE 1 TABLET (40 MG TOTAL) BY MOUTH DAILY.   rosuvastatin (CRESTOR) 20 MG tablet TAKE 1 TABLET BY MOUTH ONCE DAILY   No facility-administered medications prior to visit.    Radiology:   No results found.   Cardiac Studies:   PCV MYOCARDIAL PERFUSION WO LEXISCAN  01/12/2021 Exercise nuclear stress test was performed using Bruce protocol. Patient reached 11.3 METS, and 92% of age predicted maximum heart rate. Exercise capacity was excellent. No chest pain reported. Heart rate and hemodynamic response were normal. Stress EKG revealed no ischemic changes. Mild decrease in tracer uptake in both rest and stress images likely due to tissue attenuation. Stress LVEF calculated 49%, although visually looks normal. Low risk study.  PCV ECHOCARDIOGRAM COMPLETE [id number] Normal LV systolic function with visual EF 55-60%. Left ventricle cavity is normal in size. Normal global wall motion. Normal diastolic filling pattern, normal LAP. No significant valvular heart disease. No prior study for comparison.   EKG:   EKG 01/01/2021: Normal sinus rhythm at rate of 81 bpm, left atrial enlargement, left axis deviation, left anterior fascicular block.  IVCD, borderline criteria for LVH.  No significant change from 12/10/2020.  Previously low-voltage complexes.  Poor R wave progression.  Assessment   No diagnosis found.   There are no discontinued medications.   No orders of the defined types were placed in this encounter.   No orders of the defined types were placed in this encounter.  Recommendations:   Tim Wilson is a 55 y.o. patient originally referred by Dr. Scarlette Calico for evaluation of abnormal EKG and in view of multiple cardiovascular risk factors.  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  Patient also has strong family history of premature CAD in father with MI at age 75 and brother with MI at age 76.  Patient also follows with Dr. Renato Shin for hypercalcemia.  Patient presents for 40-monthfollow-up of hypertension and hyperlipidemia.  At last  visit ordered coronary calcium score, repeat BMP and lipid profile testing.  Also at last visit switch patient  from carvedilol to metoprolol succinate 12.5 mg daily as patient was experiencing dizziness with carvedilol.***  ***  ***  Patient is a Biochemist, clinical for [employer] and remains active and busy without chest pain or shortness of breath.  He remains asymptomatic.  He has made significant diet and lifestyle modifications since last visit including avoiding sugar, carbohydrates, high fat and high salt foods.  He continues to follow with Dr. Loanne Drilling for management of hypercalcemia and diabetes.  Reviewed and discussed with patient regarding results of echocardiogram and stress test, details above.  Echocardiogram was normal and stress test low risk.  However patient does have multiple cardiovascular risk factors and significant family history of premature CAD.  Therefore advised patient to proceed with coronary calcium score, he is agreeable.  Also will repeat BMP and lipid profile testing.  In view of low risk stress test, if coronary calcium score is normal patient may not need to be on aspirin in the future, will repeat reevaluate next visit.  In regard to hypertension, it is well controlled with increased dose of Benicar, despite patient discontinuing carvedilol.  However patient would likely benefit from beta-blocker therapy given cardiovascular risk factors and family history.  We will discontinue carvedilol in view of side effects including body aches and palpitations.  We will start patient on low-dose metoprolol succinate 12.5 mg once daily.  Counseled patient regarding medication profile and side effects advised him to notify our office immediately if he experiences symptoms of hypotension, he verbalized understanding and agreement.  Patient is congratulated as he has lost approximately 11 pounds since March, encouraged him to continue to do so.  Follow-up in 3 months, sooner if needed, for cardiovascular risk management, hypertension, mixed hyperlipidemia.    Alethia Berthold, PA-C 05/14/2021, 3:44 PM Office: [phone]

## 2021-05-15 ENCOUNTER — Ambulatory Visit: Payer: Managed Care, Other (non HMO) | Admitting: Student

## 2021-05-15 DIAGNOSIS — E781 Pure hyperglyceridemia: Secondary | ICD-10-CM

## 2021-05-15 DIAGNOSIS — Z8249 Family history of ischemic heart disease and other diseases of the circulatory system: Secondary | ICD-10-CM

## 2021-05-15 DIAGNOSIS — I10 Essential (primary) hypertension: Secondary | ICD-10-CM

## 2021-05-19 ENCOUNTER — Other Ambulatory Visit: Payer: Managed Care, Other (non HMO)

## 2021-05-21 ENCOUNTER — Ambulatory Visit: Payer: Managed Care, Other (non HMO) | Admitting: Podiatry

## 2021-06-10 ENCOUNTER — Ambulatory Visit
Admission: RE | Admit: 2021-06-10 | Discharge: 2021-06-10 | Disposition: A | Discharge status: Home / Self Care | Payer: Managed Care, Other (non HMO) | Source: Ambulatory Visit | Attending: Podiatry | Admitting: Podiatry

## 2021-06-10 ENCOUNTER — Other Ambulatory Visit: Payer: Self-pay

## 2021-06-10 DIAGNOSIS — S86012A Strain of left Achilles tendon, initial encounter: Secondary | ICD-10-CM

## 2021-06-11 ENCOUNTER — Ambulatory Visit
Admission: RE | Admit: 2021-06-11 | Discharge: 2021-06-11 | Disposition: A | Discharge status: Home / Self Care | Payer: Managed Care, Other (non HMO) | Source: Ambulatory Visit | Attending: Student | Admitting: Student

## 2021-06-11 DIAGNOSIS — Z8249 Family history of ischemic heart disease and other diseases of the circulatory system: Secondary | ICD-10-CM

## 2021-06-12 ENCOUNTER — Telehealth: Payer: Self-pay

## 2021-06-12 NOTE — Telephone Encounter (Signed)
Radiology called to say that they are concerned with Impression number four on Pts CT Cardiac Scoring. He stated that there is a small focus of air space disease in the right upper lobe which suggests subtle pulmonary infection.

## 2021-06-12 NOTE — Progress Notes (Signed)
Pt aware. Appt with [place] is being set up. Results have been sent to PCP.

## 2021-06-12 NOTE — Progress Notes (Signed)
Called patient, NA, LMAM

## 2021-06-17 ENCOUNTER — Other Ambulatory Visit (HOSPITAL_BASED_OUTPATIENT_CLINIC_OR_DEPARTMENT_OTHER): Payer: Self-pay

## 2021-06-17 MED FILL — Olmesartan Medoxomil Tab 40 MG: ORAL | 30 days supply | Qty: 30 | Fill #3 | Status: AC

## 2021-06-17 MED FILL — Insulin Glargine Soln Pen-Injector 300 Unit/ML (2 Unit Dial): SUBCUTANEOUS | 24 days supply | Qty: 24 | Fill #0 | Status: AC

## 2021-06-17 MED FILL — Rosuvastatin Calcium Tab 20 MG: ORAL | 30 days supply | Qty: 30 | Fill #3 | Status: AC

## 2021-06-17 MED FILL — Insulin Pen Needle 32 G X 6 MM (1/4" or 15/64"): 30 days supply | Qty: 100 | Fill #0 | Status: AC

## 2021-06-19 ENCOUNTER — Ambulatory Visit: Payer: Managed Care, Other (non HMO) | Admitting: Student

## 2021-06-22 ENCOUNTER — Other Ambulatory Visit: Payer: Self-pay

## 2021-06-22 ENCOUNTER — Ambulatory Visit: Payer: Managed Care, Other (non HMO) | Admitting: Student

## 2021-06-22 ENCOUNTER — Encounter: Payer: Self-pay | Admitting: Student

## 2021-06-22 VITALS — BP 132/85 | HR 76 | Temp 98.0°F | Ht 68.0 in | Wt 192.2 lb

## 2021-06-22 DIAGNOSIS — E781 Pure hyperglyceridemia: Secondary | ICD-10-CM

## 2021-06-22 DIAGNOSIS — R931 Abnormal findings on diagnostic imaging of heart and coronary circulation: Secondary | ICD-10-CM

## 2021-06-22 DIAGNOSIS — Z8249 Family history of ischemic heart disease and other diseases of the circulatory system: Secondary | ICD-10-CM

## 2021-06-22 DIAGNOSIS — I10 Essential (primary) hypertension: Secondary | ICD-10-CM

## 2021-06-22 NOTE — Progress Notes (Signed)
Primary Physician/Referring:  Janith Lima, MD  Patient ID: Tim Wilson, male    DOB: 07-05-1966, 55 y.o.   MRN: [id number]  Chief Complaint  Patient presents with   Hyperlipidemia   Follow-up   Results   HPI:    Tim Wilson  is a 55 y.o. patient originally referred by Dr. Scarlette Calico for evaluation of abnormal EKG and in view of multiple cardiovascular risk factors.  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  Patient also has strong family history of premature CAD in father with MI at age 30 and brother with MI at age 48.  Patient also follows with Dr. Renato Shin for hypercalcemia.  Patient presents for 55-monthfollow-up of cardiovascular risk management, hypertension, hyperlipidemia.  This visit switch patient from carvedilol to low-dose metoprolol succinate 12.5 mg.  Patient underwent coronary calcium score, total score 332 placing him in the 97th percentile.  Patient is feeling well without complaints today.  He is tolerating guideline directed medical therapy for CAD without issue.  He admits he has not been active lately due to ankle injury.  He is also not been taking Vascepa as it is cost prohibitive.  He is due for follow-up appointment with Dr. ELoanne Drillingin endocrinology.  Denies chest pain, dyspnea, syncope, near syncope, orthopnea, PND, leg swelling.  Past Medical History:  Diagnosis Date   Diabetes mellitus    Hyperlipidemia    Hypertension    History reviewed. No pertinent surgical history. Family History  Problem Relation Age of Onset   Heart disease Father    Heart attack Father 442  Heart disease Brother    Heart attack Brother 330  Diabetes Mother    Heart attack Mother    Breast cancer Cousin    Heart attack Paternal Uncle    Heart attack Paternal Uncle    Stroke Paternal Uncle    Heart attack Paternal Uncle 876      5 HA in the past    Colon cancer  Neg Hx    Stomach cancer Neg Hx    Esophageal cancer Neg Hx     Social History   Tobacco Use   Smoking status: Never   Smokeless tobacco: Never  Substance Use Topics   Alcohol use: Yes    Alcohol/week: 2.0 standard drinks    Types: 2 Cans of beer per week    Comment: on occasion, not in excess   Marital Status: Married  ROS  Review of Systems  Constitutional: Negative for malaise/fatigue and weight gain.  Cardiovascular:  Negative for chest pain, claudication, dyspnea on exertion, leg swelling, near-syncope, orthopnea, palpitations, paroxysmal nocturnal dyspnea and syncope.  Respiratory:  Negative for shortness of breath.   Hematologic/Lymphatic: Does not bruise/bleed easily.  Gastrointestinal:  Negative for melena.  Neurological:  Negative for dizziness and weakness.  Objective  Blood pressure 132/85, pulse 76, temperature 98 F (36.7 C), height '5\' 8"'$  (1.727 m), weight 192 lb 3.2 oz (87.2 kg), SpO2 97 %.  Vitals with BMI 06/22/2021 02/12/2021 01/01/2021  Height '5\' 8"'$  '5\' 8"'$  '5\' 8"'$   Weight 192 lbs 3 oz 188 lbs 196 lbs 13 oz  BMI 29.23 [id number] Systolic [id number] Diastolic 85 78 89  Pulse 76 77 89     Physical Exam Vitals reviewed.  HENT:     Head: Normocephalic and atraumatic.  Cardiovascular:     Rate and Rhythm:  Normal rate and regular rhythm.     Pulses: Intact distal pulses.     Heart sounds: Normal heart sounds, S1 normal and S2 normal. No murmur heard.   No gallop.     Comments: No leg edema, no JVD. Pulmonary:     Effort: Pulmonary effort is normal. No respiratory distress.     Breath sounds: Normal breath sounds. No wheezing, rhonchi or rales.  Musculoskeletal:     Right lower leg: No edema.     Left lower leg: No edema.  Neurological:     Mental Status: He is alert.   Laboratory examination:   Recent Labs    12/11/20 1151  NA 152*  K 4.6  CL 108  CO2 16*  GLUCOSE 375*  BUN 13  CREATININE 1.23  CALCIUM 11.0*   CrCl cannot be calculated  (Patient's most recent lab result is older than the maximum 21 days allowed.).  CMP Latest Ref Rng & Units 12/11/2020 03/25/2020 11/15/2019  Glucose 65 - 99 mg/dL 375(H) 336(H) 344(H)  BUN 7 - 25 mg/dL '13 12 18  '$ Creatinine 0.70 - 1.33 mg/dL 1.23 1.07 1.28  Sodium 135 - 146 mmol/L 152(H) 135 131(L)  Potassium 3.5 - 5.3 mmol/L 4.6 3.9 4.6  Chloride 98 - 110 mmol/L 108 98 96  CO2 20 - 32 mmol/L 16(L) 28 26  Calcium 8.6 - 10.3 mg/dL 11.0(H) 10.1 10.2  Total Protein 6.1 - 8.1 g/dL 8.9(H) - -  Total Bilirubin 0.2 - 1.2 mg/dL 0.2 - -  Alkaline Phos 39 - 117 U/L - - -  AST 10 - 35 U/L 15 - -  ALT 9 - 46 U/L 16 - -   CBC Latest Ref Rng & Units 12/11/2020 05/15/2019 05/01/2018  WBC 3.8 - 10.8 Thousand/uL 8.5 5.1 6.2  Hemoglobin 13.2 - 17.1 g/dL 14.2 14.3 13.0  Hematocrit 38.5 - 50.0 % 40.2 42.4 38.1(L)  Platelets 140 - 400 Thousand/uL 300 279.0 308.0   Urine dipstick shows negative for all components, positive for protein.   Lipid Panel Recent Labs    12/11/20 1151  CHOL 733*  TRIG 7,582*  HDL 15*  CHOLHDL 48.9*   Lipid Panel     Component Value Date/Time   CHOL 733 (H) 12/11/2020 1151   TRIG 7,582 (H) 12/11/2020 1151   HDL 15 (L) 12/11/2020 1151   CHOLHDL 48.9 (H) 12/11/2020 1151   VLDL 48.6 (H) 01/20/2017 1112   LDLCALC  12/11/2020 1151     Comment:     . LDL cholesterol not calculated. Triglyceride levels greater than 400 mg/dL invalidate calculated LDL results. . Reference range: <100 . Desirable range <100 mg/dL for primary prevention;   <70 mg/dL for patients with CHD or diabetic patients  with > or = 2 CHD risk factors. Marland Kitchen LDL-C is now calculated using the Martin-Hopkins  calculation, which is a validated novel method providing  better accuracy than the Friedewald equation in the  estimation of LDL-C.  Cresenciano Genre et al. Annamaria Helling. [id number]): 2061-2068  (http://education.[facility].com/faq/FAQ164)    LDLDIRECT 49.0 05/15/2019 1434     HEMOGLOBIN A1C Lab Results   Component Value Date   HGBA1C 11.9 (H) 12/11/2020   MPG 295 12/11/2020   TSH Recent Labs    12/11/20 1151  TSH 1.24   Allergies   Allergies  Allergen Reactions   Metformin And Related Diarrhea   Ramipril     cough      Medications Prior to Visit:   Outpatient Medications Prior  to Visit  Medication Sig Dispense Refill   aspirin EC 81 MG tablet Take 1 tablet (81 mg total) by mouth daily. 90 tablet 1   fenofibrate (TRICOR) 145 MG tablet TAKE 1 TABLET BY MOUTH ONCE DAILY 90 tablet 0   Glucagon 1 MG/0.2ML SOAJ INJECT UNDER THE SKIN INTO THE SKIN DAILY AS NEEDED .4 mL 5   glucose blood (ONETOUCH VERIO) test strip 1 each by Other route 2 (two) times daily. And lancets 2/day 100 each 12   insulin glargine, 2 Unit Dial, (TOUJEO MAX) 300 UNIT/ML Solostar Pen INJECT 100 UNITS INTO THE SKIN EVERY MORNING. 24 mL 3   insulin lispro (HUMALOG) 200 UNIT/ML KwikPen INJECT 10 UNITS INTO THE SKIN WITH BREAKFAST, LUNCH, AND EVENING MEAL 9 mL 1   Insulin Pen Needle 32G X 6 MM MISC USE TO INJECT TOUJEO 100 each 0   Insulin Pen Needle 32G X 6 MM MISC USE TO INJECT TOUJEO 100 each 0   metoprolol succinate (TOPROL-XL) 25 MG 24 hr tablet Take one-half tablet (12.5 mg total) by mouth daily. Take with or immediately following a meal. 45 tablet 3   olmesartan (BENICAR) 40 MG tablet TAKE 1 TABLET (40 MG TOTAL) BY MOUTH DAILY. 90 tablet 3   rosuvastatin (CRESTOR) 20 MG tablet TAKE 1 TABLET BY MOUTH ONCE DAILY 90 tablet 1   Cholecalciferol 1.25 MG (50000 UT) capsule Take 1 capsule (50,000 Units total) by mouth once a week. 12 capsule 0   Continuous Blood Gluc Receiver (FREESTYLE LIBRE 2 READER) DEVI USE AS DIRECTED 2 each 5   Continuous Blood Gluc Sensor (FREESTYLE LIBRE 2 SENSOR) MISC USE AS DIRECTED 2 each 5   icosapent Ethyl (VASCEPA) 1 g capsule TAKE 2 CAPSULES (2 G TOTAL) BY MOUTH 2 (TWO) TIMES DAILY. (Patient not taking: Reported on 06/22/2021) 360 capsule 1   No facility-administered medications prior  to visit.     Final Medications at End of Visit    Current Meds  Medication Sig   aspirin EC 81 MG tablet Take 1 tablet (81 mg total) by mouth daily.   fenofibrate (TRICOR) 145 MG tablet TAKE 1 TABLET BY MOUTH ONCE DAILY   Glucagon 1 MG/0.2ML SOAJ INJECT UNDER THE SKIN INTO THE SKIN DAILY AS NEEDED   glucose blood (ONETOUCH VERIO) test strip 1 each by Other route 2 (two) times daily. And lancets 2/day   insulin glargine, 2 Unit Dial, (TOUJEO MAX) 300 UNIT/ML Solostar Pen INJECT 100 UNITS INTO THE SKIN EVERY MORNING.   insulin lispro (HUMALOG) 200 UNIT/ML KwikPen INJECT 10 UNITS INTO THE SKIN WITH BREAKFAST, LUNCH, AND EVENING MEAL   Insulin Pen Needle 32G X 6 MM MISC USE TO INJECT TOUJEO   Insulin Pen Needle 32G X 6 MM MISC USE TO INJECT TOUJEO   metoprolol succinate (TOPROL-XL) 25 MG 24 hr tablet Take one-half tablet (12.5 mg total) by mouth daily. Take with or immediately following a meal.   olmesartan (BENICAR) 40 MG tablet TAKE 1 TABLET (40 MG TOTAL) BY MOUTH DAILY.   rosuvastatin (CRESTOR) 20 MG tablet TAKE 1 TABLET BY MOUTH ONCE DAILY      Radiology:   No results found.   Cardiac Studies:   PCV MYOCARDIAL PERFUSION WO LEXISCAN 01/12/2021 Exercise nuclear stress test was performed using Bruce protocol. Patient reached 11.3 METS, and 92% of age predicted maximum heart rate. Exercise capacity was excellent. No chest pain reported. Heart rate and hemodynamic response were normal. Stress EKG revealed no ischemic changes. Mild  decrease in tracer uptake in both rest and stress images likely due to tissue attenuation. Stress LVEF calculated 49%, although visually looks normal. Low risk study.  PCV ECHOCARDIOGRAM COMPLETE [id number] Normal LV systolic function with visual EF 55-60%. Left ventricle cavity is normal in size. Normal global wall motion. Normal diastolic filling pattern, normal LAP. No significant valvular heart disease. No prior study for comparison.  Coronary  calcium score 06/11/2021: LAD: 276   LCx: 53   RCA: 3   CORONARY CALCIUM   Total Agatston Score: 332   MESA database percentile: 97   EKG:   EKG 01/01/2021: Normal sinus rhythm at rate of 81 bpm, left atrial enlargement, left axis deviation, left anterior fascicular block.  IVCD, borderline criteria for LVH.  No significant change from 12/10/2020.  Previously low-voltage complexes.  Poor R wave progression.  Assessment     ICD-10-CM   1. Elevated coronary artery calcium score  R93.1     2. Pure hypertriglyceridemia  E78.1     3. Family history of premature coronary artery disease  Z82.49     4. Essential hypertension  I10       There are no discontinued medications.   No orders of the defined types were placed in this encounter.   No orders of the defined types were placed in this encounter.  Recommendations:   Tim Wilson is a 55 y.o. patient originally referred by Dr. Scarlette Calico for evaluation of abnormal EKG and in view of multiple cardiovascular risk factors.  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  Patient also has strong family history of premature CAD in father with MI at age 55 and brother with MI at age 5.  Patient also follows with Dr. Renato Shin for hypercalcemia.  Patient presents for 61-monthfollow-up of cardiovascular risk management, hypertension, hyperlipidemia.  This visit switch patient from carvedilol to low-dose metoprolol succinate 12.5 mg.  Patient underwent coronary calcium score, total score 332 placing her in the 97th percentile.  Patient is presently tolerating aspirin and statin therapy as well as beta-blocker and ARB.  Reviewed and discussed with patient results of coronary calcium score, he verbalized understanding.  Given elevated coronary calcium score and multiple cardiovascular risk factors recommend aggressive risk factor management.   Advised patient to follow closely with Dr. ELoanne Drillingregarding management of diabetes.  Recommend LDL goal <70.  We will repeat lipid profile testing at this time.  Patient has had low risk stress test and essentially normal echocardiogram.  Blood pressure is well controlled.  Follow-up in 6 months, sooner if needed, for elevated coronary calcium score and cardiovascular risk management.   CAlethia Berthold PA-C 06/22/2021, 3:41 PM Office: [phone]

## 2021-06-24 ENCOUNTER — Other Ambulatory Visit: Payer: Self-pay | Admitting: Student

## 2021-06-24 DIAGNOSIS — E781 Pure hyperglyceridemia: Secondary | ICD-10-CM

## 2021-06-24 DIAGNOSIS — E7800 Pure hypercholesterolemia, unspecified: Secondary | ICD-10-CM

## 2021-06-24 LAB — LIPID PANEL WITH LDL/HDL RATIO
Cholesterol, Total: 218 mg/dL — ABNORMAL HIGH (ref 100–199)
HDL: 30 mg/dL — ABNORMAL LOW (ref >39)
LDL Chol Calc (NIH): 115 mg/dL — ABNORMAL HIGH (ref 0–99)
LDL/HDL Ratio: 3.8 ratio — ABNORMAL HIGH (ref 0.0–3.6)
Triglycerides: 420 mg/dL — ABNORMAL HIGH (ref 0–149)
VLDL Cholesterol Cal: 73 mg/dL — ABNORMAL HIGH (ref 5–40)

## 2021-06-24 LAB — BASIC METABOLIC PANEL
BUN/Creatinine Ratio: 19 (ref 9–20)
BUN: 19 mg/dL (ref 6–24)
CO2: 19 mmol/L — ABNORMAL LOW (ref 20–29)
Calcium: 10.3 mg/dL — ABNORMAL HIGH (ref 8.7–10.2)
Chloride: 100 mmol/L (ref 96–106)
Creatinine, Ser: 1.01 mg/dL (ref 0.76–1.27)
Glucose: 135 mg/dL — ABNORMAL HIGH (ref 65–99)
Potassium: 4.3 mmol/L (ref 3.5–5.2)
Sodium: 137 mmol/L (ref 134–144)
eGFR: 88 mL/min/{1.73_m2} (ref >59)

## 2021-06-24 NOTE — Progress Notes (Signed)
Seeing you soon

## 2021-06-26 NOTE — Progress Notes (Signed)
Called pt, no answer. Left vm requesting call back?

## 2021-06-29 NOTE — Progress Notes (Signed)
Lorna Few called and spoke to pt, pt voiced understanding. Pt came in to pick up Vascepa samples.

## 2021-07-07 ENCOUNTER — Ambulatory Visit: Payer: Managed Care, Other (non HMO) | Admitting: Podiatry

## 2021-07-07 ENCOUNTER — Other Ambulatory Visit (HOSPITAL_BASED_OUTPATIENT_CLINIC_OR_DEPARTMENT_OTHER): Payer: Self-pay

## 2021-07-07 MED FILL — Icosapent Ethyl Cap 1 GM: ORAL | 30 days supply | Qty: 120 | Fill #0 | Status: AC

## 2021-07-08 ENCOUNTER — Other Ambulatory Visit (HOSPITAL_BASED_OUTPATIENT_CLINIC_OR_DEPARTMENT_OTHER): Payer: Self-pay

## 2021-07-09 ENCOUNTER — Ambulatory Visit (INDEPENDENT_AMBULATORY_CARE_PROVIDER_SITE_OTHER): Payer: Managed Care, Other (non HMO) | Admitting: Podiatry

## 2021-07-09 ENCOUNTER — Other Ambulatory Visit: Payer: Self-pay

## 2021-07-09 DIAGNOSIS — S86012A Strain of left Achilles tendon, initial encounter: Secondary | ICD-10-CM

## 2021-07-13 NOTE — Progress Notes (Signed)
Subjective:  Patient ID: Tim Wilson, male    DOB: 02/21/1966,  MRN: [id number]  Chief Complaint  Patient presents with   Tendonitis      mri follow up tendonitis left achilles    55 y.o. male returns for follow-up with the above complaint. History confirmed with patient.  Doing okay his pain is improving he completed the MRI  Objective:  Physical Exam: warm, good capillary refill, no trophic changes or ulcerative lesions, normal DP and PT pulses, and normal sensory exam. Left Foot: Palpable fullness and not within the mid substance of the Achilles tendon, he has 4+ out of 5 strength on the side compared to the right side and plantar flexion  Radiographs: Multiple views x-ray of the left foot: no fracture, dislocation, swelling or degenerative changes noted he does have a slight Haglund's deformity  Study Result  Narrative & Impression  CLINICAL DATA:  Achilles tendon trauma or laceration   EXAM: MR OF THE LEFT HEEL WITHOUT CONTRAST   TECHNIQUE: Multiplanar, multisequence MR imaging of the left heel was performed. No intravenous contrast was administered.   COMPARISON:  Left foot radiograph 04/23/2021   FINDINGS: TENDONS   Peroneal: Peroneal longus and brevis tendons are intact. There is an accessory peroneal tendon inserting on the lateral calcaneus, likely peroneus quartus.   Posteromedial: Posterior tibial tendon intact. Flexor digitorum longus tendon intact. Flexor hallucis longus tendon intact.   Anterior: Tibialis anterior tendon intact. Extensor hallucis longus tendon intact Extensor digitorum longus tendon intact.   Achilles: There is a full-thickness Achilles tendon rupture centered approximately 7.0 cm above the calcaneal insertion. The tendon gap measures approximately 4.0 cm. There is only mild edema along the rupture.   Plantar Fascia: Intact.   LIGAMENTS   Lateral: Laxity of the ATFL with adjacent scarring, consistent with chronic sprain.  Calcaneofibular ligament is likely intact. Posterior talofibular ligament intact. Anterior and posterior tibiofibular ligaments intact.   Medial: Deltoid ligament intact. Spring ligament intact.   CARTILAGE   Ankle Joint: Small tibiotalar joint effusion. There is moderate-severe tibiotalar osteoarthritis.   Subtalar Joints/Sinus Tarsi: Minimal subtalar arthritis no subtalar joint effusion. Normal sinus tarsi.   Bones: No acute fracture or dislocation.   Soft Tissue: No fluid collection or hematoma. Muscles are normal without edema or atrophy. Tarsal tunnel is normal.   IMPRESSION: Full-thickness Achilles tendon rupture centered approximately 7.0 cm above the calcaneal insertion. Tendon gap measures up to 4.0 cm. Appearance suggests a subacute injury.   Chronic ATFL sprain.   Moderate-severe tibiotalar osteoarthritis.     Electronically Signed   By: Maurine Simmering M.D.   On: 06/12/2021 08:57     Assessment:   1. Rupture Achilles tendon, left, initial encounter      Plan:  Patient was evaluated and treated and all questions answered.  Reviewed the MRI results with him.  I do think he likely suffered a severe rupture which she has essentially treated nonoperatively at this point.  He does have some strength deficit.  I recommended physical therapy and sent him referral for [facility] physical therapy.  We also discussed the long-term issues of the strength deficit and the possibility of an [facility] transfer is symptomatic.  This would not be something I would want to do unless his A1c was much lower below 8%.  I will see him back in about 2 months for review after PT focusing on Achilles rehab.  Discussed the rerupture risk of tendon tears.  Return in about 8 weeks (  around 09/03/2021) for re-check Achilles tendon.

## 2021-07-24 ENCOUNTER — Other Ambulatory Visit: Payer: Self-pay

## 2021-07-24 ENCOUNTER — Ambulatory Visit (INDEPENDENT_AMBULATORY_CARE_PROVIDER_SITE_OTHER): Payer: Managed Care, Other (non HMO) | Admitting: Endocrinology

## 2021-07-24 ENCOUNTER — Other Ambulatory Visit (HOSPITAL_BASED_OUTPATIENT_CLINIC_OR_DEPARTMENT_OTHER): Payer: Self-pay

## 2021-07-24 VITALS — BP 100/60 | HR 82 | Ht 68.0 in | Wt 201.0 lb

## 2021-07-24 DIAGNOSIS — Z794 Long term (current) use of insulin: Secondary | ICD-10-CM

## 2021-07-24 DIAGNOSIS — E118 Type 2 diabetes mellitus with unspecified complications: Secondary | ICD-10-CM

## 2021-07-24 DIAGNOSIS — Z23 Encounter for immunization: Secondary | ICD-10-CM | POA: Diagnosis not present

## 2021-07-24 LAB — POCT GLYCOSYLATED HEMOGLOBIN (HGB A1C): Hemoglobin A1C: 8.8 % — AB (ref 4.0–5.6)

## 2021-07-24 MED ORDER — ACCU-CHEK GUIDE VI STRP
1.0000 | ORAL_STRIP | Freq: Two times a day (BID) | 3 refills | Status: DC
  Filled 2021-07-24: qty 150, 75d supply, fill #0

## 2021-07-24 MED ORDER — ACCU-CHEK SOFTCLIX LANCETS MISC
3 refills | Status: AC
  Filled 2021-07-24: qty 100, 30d supply, fill #0

## 2021-07-24 MED ORDER — FREESTYLE LIBRE 2 SENSOR MISC
1.0000 | 3 refills | Status: DC
  Filled 2021-07-24: qty 2, 28d supply, fill #0

## 2021-07-24 NOTE — Progress Notes (Signed)
Subjective:    Patient ID: Tim Wilson, male    DOB: Nov 13, 1965, 55 y.o.   MRN: [id number]  HPI Pt returns for f/u of diabetes mellitus: DM type: Insulin-requiring type 2.   Dx'ed: 6759 Complications: none Therapy: insulin since 2019, and 2 oral meds.   DKA: never Severe hypoglycemia: never.  Pancreatitis: once, in 2019 SDOH: he could not afford Rybelsus.  Other: he also took insulin in 2012, and 2014-2015; he did not tolerate metformin-XR or invokana (diarrhea with both); he stopped pioglitizone due to fatigue and DOE; he works 1st shift; he takes QD insulin, after poor results with multiple daily injections; he also has hypertriglyceridemia.  Interval hx:  Pt says he never misses the insulin.  Pt says he has a renewed effort to improve his health, but he has not recently checked cbg.  He does not take Humalog.   Past Medical History:  Diagnosis Date   Diabetes mellitus    Hyperlipidemia    Hypertension     No past surgical history on file.  Social History   Socioeconomic History   Marital status: Married    Spouse name: Not on file   Number of children: 5   Years of education: Not on file   Highest education level: Not on file  Occupational History   Occupation: Restaurant manager, fast food: [employer]    Comment: coaches basketball at [employer]  Tobacco Use   Smoking status: Never   Smokeless tobacco: Never  Vaping Use   Vaping Use: Never used  Substance and Sexual Activity   Alcohol use: Yes    Alcohol/week: 2.0 standard drinks    Types: 2 Cans of beer per week    Comment: on occasion, not in excess   Drug use: No   Sexual activity: Yes  Other Topics Concern   Not on file  Social History Narrative   Regular exercise-yes   Social Determinants of Health   Financial Resource Strain: Not on file  Food Insecurity: Not on file  Transportation Needs: Not on file  Physical Activity: Not on file  Stress: Not on file  Social  Connections: Not on file  Intimate Partner Violence: Not on file    Current Outpatient Medications on File Prior to Visit  Medication Sig Dispense Refill   aspirin EC 81 MG tablet Take 1 tablet (81 mg total) by mouth daily. 90 tablet 1   Cholecalciferol 1.25 MG (50000 UT) capsule Take 1 capsule (50,000 Units total) by mouth once a week. 12 capsule 0   fenofibrate (TRICOR) 145 MG tablet TAKE 1 TABLET BY MOUTH ONCE DAILY 90 tablet 0   Glucagon 1 MG/0.2ML SOAJ INJECT UNDER THE SKIN INTO THE SKIN DAILY AS NEEDED .4 mL 5   icosapent Ethyl (VASCEPA) 1 g capsule TAKE 2 CAPSULES (2 G TOTAL) BY MOUTH 2 (TWO) TIMES DAILY. 360 capsule 1   insulin glargine, 2 Unit Dial, (TOUJEO MAX) 300 UNIT/ML Solostar Pen INJECT 100 UNITS INTO THE SKIN EVERY MORNING. 24 mL 3   Insulin Pen Needle 32G X 6 MM MISC USE TO INJECT TOUJEO 100 each 0   Insulin Pen Needle 32G X 6 MM MISC USE TO INJECT TOUJEO 100 each 0   metoprolol succinate (TOPROL-XL) 25 MG 24 hr tablet Take one-half tablet (12.5 mg total) by mouth daily. Take with or immediately following a meal. 45 tablet 3   olmesartan (BENICAR) 40 MG tablet TAKE 1 TABLET (40 MG TOTAL)  BY MOUTH DAILY. 90 tablet 3   rosuvastatin (CRESTOR) 20 MG tablet TAKE 1 TABLET BY MOUTH ONCE DAILY 90 tablet 1   No current facility-administered medications on file prior to visit.    Allergies  Allergen Reactions   Metformin And Related Diarrhea   Ramipril     cough    Family History  Problem Relation Age of Onset   Heart disease Father    Heart attack Father 6   Heart disease Brother    Heart attack Brother 50   Diabetes Mother    Heart attack Mother    Breast cancer Cousin    Heart attack Paternal Uncle    Heart attack Paternal Uncle    Stroke Paternal Uncle    Heart attack Paternal Uncle 74       5 HA in the past    Colon cancer Neg Hx    Stomach cancer Neg Hx    Esophageal cancer Neg Hx     BP 100/60 (BP Location: Right Arm, Patient Position: Sitting, Cuff  Size: Large)   Pulse 82   Ht 5\' 8"  (1.727 m)   Wt 201 lb (91.2 kg)   SpO2 96%   BMI 30.56 kg/m    Review of Systems     Objective:   Physical Exam Pulses: dorsalis pedis intact bilat.   MSK: no deformity of the feet CV: no leg edema Skin:  no ulcer on the feet.  normal color and temp on the feet. Neuro: sensation is intact to touch on the feet  Lab Results  Component Value Date   HGBA1C 8.8 (A) 07/24/2021       Assessment & Plan:  Insulin-requiring type 2 DM: uncontrolled.   Patient Instructions  Here is a new meter. I have sent a prescription to your pharmacy, for strips. On this type of insulin schedule, you should eat meals on a regular schedule.  If a meal is missed or significantly delayed, your blood sugar could go low.   check your blood sugar twice a day.  vary the time of day when you check, between before the 3 meals, and at bedtime.  also check if you have symptoms of your blood sugar being too high or too low.  please keep a record of the readings and bring it to your next appointment here (or you can bring the meter itself).  You can write it on any piece of paper.  please call us sooner if your blood sugar goes below 70, or if you have a lot of readings over 200.   I have sent a prescription to your pharmacy, for the continuous glucose monitor sensors.   Please come back for a follow-up appointment in 3 months.

## 2021-07-24 NOTE — Patient Instructions (Addendum)
Here is a new meter. I have sent a prescription to your pharmacy, for strips. On this type of insulin schedule, you should eat meals on a regular schedule.  If a meal is missed or significantly delayed, your blood sugar could go low.   check your blood sugar twice a day.  vary the time of day when you check, between before the 3 meals, and at bedtime.  also check if you have symptoms of your blood sugar being too high or too low.  please keep a record of the readings and bring it to your next appointment here (or you can bring the meter itself).  You can write it on any piece of paper.  please call us sooner if your blood sugar goes below 70, or if you have a lot of readings over 200.   I have sent a prescription to your pharmacy, for the continuous glucose monitor sensors.   Please come back for a follow-up appointment in 3 months.

## 2021-07-27 ENCOUNTER — Other Ambulatory Visit: Payer: Self-pay

## 2021-07-27 ENCOUNTER — Encounter: Payer: Self-pay | Admitting: Physical Therapy

## 2021-07-27 ENCOUNTER — Ambulatory Visit: Payer: Managed Care, Other (non HMO) | Attending: Podiatry | Admitting: Physical Therapy

## 2021-07-27 DIAGNOSIS — M25572 Pain in left ankle and joints of left foot: Secondary | ICD-10-CM | POA: Insufficient documentation

## 2021-07-27 DIAGNOSIS — M6281 Muscle weakness (generalized): Secondary | ICD-10-CM | POA: Insufficient documentation

## 2021-07-27 DIAGNOSIS — S86012A Strain of left Achilles tendon, initial encounter: Secondary | ICD-10-CM | POA: Insufficient documentation

## 2021-07-27 DIAGNOSIS — R2689 Other abnormalities of gait and mobility: Secondary | ICD-10-CM | POA: Insufficient documentation

## 2021-07-27 NOTE — Therapy (Signed)
[place], [place], [address] Phone: [phone]   Fax:  [phone]  Physical Therapy Evaluation  Patient Details  Name: Tim Wilson MRN: [id number] Date of Birth: March 10, 1966 Referring Provider (PT): Criselda Peaches, [place]   Encounter Date: 07/27/2021   PT End of Session - 07/27/21 1214     Visit Number 1    Number of Visits 12    Date for PT Re-Evaluation 09/07/21    Authorization Type Generic Commercial    PT Start Time 1215    PT Stop Time 1300    PT Time Calculation (min) 45 min    Activity Tolerance Patient tolerated treatment well    Behavior During Therapy [facility] for tasks assessed/performed             Past Medical History:  Diagnosis Date   Diabetes mellitus    Hyperlipidemia    Hypertension     History reviewed. No pertinent surgical history.  There were no vitals filed for this visit.    Subjective Assessment - 07/27/21 1218     Subjective Patient reports a couple months back he injured his achilles. He did initially feel unbalance and his foot felt weak, but did not have it looked at until about 2 months after the initial injury. He had an MRI which showed a ruptured achilles and the doctor told him it was starting to heel but he needed some strength. He feels like his gait has been thrown off and his ankle does feel weaker. He has trouble walking for longer periods and at the end of the day will have soreness. Patient also notes heaviness of the foot/ankle espcially when going down stairs.    Limitations Standing;Walking;House hold activities    How long can you walk comfortably? "no limitation as long as walking at slower pace"    Diagnostic tests MRI    Patient Stated Goals Get the ankle stronger    Currently in Pain? Yes    Pain Score 0-No pain    Pain Location Ankle    Pain Orientation Left    Pain Descriptors / Indicators Sore;Tightness    Pain Type Chronic pain     Pain Onset More than a month ago    Pain Frequency Intermittent    Aggravating Factors  Soreness in the evening with extended periods of activity    Pain Relieving Factors Rest    Effect of Pain on Daily Activities Limited with walking and extended periods of activity                [facility] PT Assessment - 07/27/21 0001       Assessment   Medical Diagnosis Rupture Achilles tendon, left    Referring Provider (PT) Criselda Peaches, DPM    Next MD Visit 09/08/2021    Prior Therapy No      Precautions   Precautions None      Restrictions   Weight Bearing Restrictions No      Balance Screen   Has the patient fallen in the past 6 months No    Has the patient had a decrease in activity level because of a fear of falling?  No    Is the patient reluctant to leave their home because of a fear of falling?  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   Overall Cognitive Status Within Functional Limits for tasks assessed      Observation/Other Assessments   Observations Patient appears in no apparent distress    Focus on Therapeutic Outcomes (FOTO)  63% functional status      Sensation   Light Touch Appears Intact      Coordination   Gross Motor Movements are Fluid and Coordinated Yes      Functional Tests   Functional tests Single leg stance      Single Leg Stance   Comments Right: > 30 sec, Left: ~ 17 seconds      ROM / Strength   AROM / PROM / Strength AROM;Strength      AROM   AROM Assessment Site Ankle    Right/Left Ankle Right;Left    Right Ankle Dorsiflexion 5    Right Ankle Plantar Flexion 50    Right  Ankle Inversion 35    Right Ankle Eversion 15    Left Ankle Dorsiflexion 8    Left Ankle Plantar Flexion 50    Left Ankle Inversion 40    Left Ankle Eversion 15      Strength   Strength Assessment Site Ankle    Right/Left Ankle Right;Left    Right Ankle Dorsiflexion 5/5    Right Ankle Plantar Flexion 5/5    Right Ankle Inversion 5/5    Right Ankle Eversion 5/5    Left Ankle Dorsiflexion 5/5    Left Ankle Plantar Flexion 2/5    Left Ankle Inversion 5/5    Left Ankle Eversion 5/5      Flexibility   Soft Tissue Assessment /Muscle Length no      Palpation   Palpation comment Non-TTP, patient does exhibit taut bands in left gastroc soleus complex      Transfers   Transfers Independent with all Transfers      Ambulation/Gait   Ambulation/Gait Yes    Ambulation/Gait Assistance 7: Independent    Gait Comments Decreased toe off on left                        Objective measurements completed on examination: See above findings.      [facility] Adult PT Treatment/Exercise:  Therapeutic Exercise: - Banded ankle PF with green x 20 - Seated heel raise with 15# x 20 - Standing calf isometric on edge of box 5 x 10 sec  Manual Therapy: - n/a  Neuromuscular re-ed: - n/a  Therapeutic Activity: - n/a  Modalities: - n/a           PT Education - 07/27/21 1213     Education Details Exam findings, POC, HEP    Person(s) Educated Patient    Methods Explanation;Demonstration;Verbal cues;Handout    Comprehension Verbalized understanding;Returned demonstration;Verbal cues required;Need further instruction              PT Short Term Goals - 07/27/21 1229       PT SHORT TERM GOAL #1   Title Patient will be I with initial HEP to progress with PT    Time 3  Period Weeks    Status New    Target Date 08/17/21      PT SHORT TERM GOAL #2   Title PT will review FOTO with patient by 3rd visit to understand expected progress    Time 3    Period Weeks     Status New    Target Date 08/17/21               PT Long Term Goals - 07/27/21 1230       PT LONG TERM GOAL #1   Title Patient will be I with final HEP to maintain progress from PT    Time 6    Period Weeks    Status New    Target Date 09/07/21      PT LONG TERM GOAL #2   Title Patient will report >/= 73% status on FOTO to indicate improved functional ability    Time 6    Period Weeks    Status New    Target Date 09/07/21      PT LONG TERM GOAL #3   Title Patient will demonstrate left calf strength >/= 4/5 MMT in order to return to basketball related activities    Time 6    Period Weeks    Status New    Target Date 09/07/21      PT LONG TERM GOAL #4   Title Patient will demonstrate normalized gait mechanics to reduce soreness with extended periods of walking    Time 6    Period Weeks    Status New    Target Date 09/07/21      PT LONG TERM GOAL #5   Title Patient will demonstrate >/= 30 sec SLS in order to demonstrate improved ankle/hip/core control    Time 6    Period Weeks    Status New    Target Date 09/07/21                    Plan - 07/27/21 1335     Clinical Impression Statement Patient presents to PT with report of chronic left achilles rupture. Currently his main limitation is left calf strength affect his gait and and functional mobility. He demonstrates good ankle motion and reports minimal pain other than soreness toward the end of the day with extended periods of activity. He was provided exercises to initiate calf strengthening and he would benefit from continued skilled PT in order to progress his strength and normalize gait mechanics to reduce soreness and maximize his functional ability.    Personal Factors and Comorbidities Time since onset of injury/illness/exacerbation    Examination-Activity Limitations Locomotion Level;Stairs    Examination-Participation Restrictions Occupation;Community Activity;Yard Work    Stability/Clinical  Decision Making Stable/Uncomplicated    Designer, jewellery Low    Rehab Potential Good    PT Frequency 2x / week    PT Duration 6 weeks    PT Treatment/Interventions ADLs/Self Care Home Management;Aquatic Therapy;Cryotherapy;Electrical Stimulation;Iontophoresis 4mg /ml Dexamethasone;Moist Heat;Traction;Ultrasound;Gait training;Stair training;Functional mobility training;Therapeutic activities;Therapeutic exercise;Balance training;Neuromuscular re-education;Manual techniques;Patient/family education;Dry needling;Passive range of motion;Taping;Vasopneumatic Device;Spinal Manipulations;Joint Manipulations    PT Next Visit Plan Review HEP and progress PRN, progression of calf strength with BFR, balance training    PT Home Exercise Plan [email or web address]    Consulted and Agree with Plan of Care Patient             Patient will benefit from skilled therapeutic intervention in order to improve the following deficits and impairments:  Abnormal gait,  Decreased activity tolerance, Decreased strength, Pain, Decreased balance  Visit Diagnosis: Pain in left ankle and joints of left foot  Muscle weakness (generalized)  Other abnormalities of gait and mobility     Problem List Patient Active Problem List   Diagnosis Date Noted   Diabetes mellitus type 1.5, managed as type 1 ([place]) 12/12/2020   Need for hepatitis C screening test 12/10/2020   Abnormal electrocardiogram (ECG) (EKG) 12/10/2020   Sensorineural hearing loss (SNHL) of both ears 01/28/2020   Tinnitus 01/28/2020   Tinnitus aurium, bilateral 11/15/2019   Obstructive sleep apnea syndrome 05/16/2019   Type 2 diabetes mellitus with complication, with long-term current use of insulin ([place]) 05/16/2019   Vitamin D deficiency disease 05/16/2019   Constipation 04/04/2018   Screen for colon cancer 01/20/2017   Snoring 09/21/2016   Routine general medical examination at a health care facility 07/13/2012   Hyperlipidemia with target LDL less  than 100 08/06/2010   MICROALBUMINURIA 07/27/2010   Type II diabetes mellitus with manifestations ([place]) 07/10/2010   HYPERTRIGLYCERIDEMIA 10/16/2009   Essential hypertension 10/16/2009    Hilda Blades, PT, DPT, LAT, ATC 07/27/21  1:53 PM Phone: [phone] Fax: [place] [facility] [address] [place], [place], [address] Phone: [phone]   Fax:  [phone]  Name: TYRIE PORZIO MRN: [id number] Date of Birth: 1965/12/30

## 2021-07-27 NOTE — Patient Instructions (Signed)
Access Code: [email or web address] URL: [email or web address] Date: 07/27/2021 Prepared by: Hilda Blades  Exercises Long Sitting Ankle Plantar Flexion with Resistance - 2 x daily - 7 x weekly - 2-3 sets - 20 reps Seated Heel Raise - 2 x daily - 7 x weekly - 2-3 sets - 20 reps Isometric Heel Raise at Wall - 2 x daily - 7 x weekly - 10 reps - 10 seconds hold

## 2021-08-03 ENCOUNTER — Other Ambulatory Visit (HOSPITAL_BASED_OUTPATIENT_CLINIC_OR_DEPARTMENT_OTHER): Payer: Self-pay

## 2021-08-03 MED FILL — Rosuvastatin Calcium Tab 20 MG: ORAL | 30 days supply | Qty: 30 | Fill #4 | Status: AC

## 2021-08-03 MED FILL — Olmesartan Medoxomil Tab 40 MG: ORAL | 30 days supply | Qty: 30 | Fill #4 | Status: AC

## 2021-08-04 ENCOUNTER — Ambulatory Visit: Payer: No Typology Code available for payment source | Attending: Internal Medicine

## 2021-08-04 ENCOUNTER — Other Ambulatory Visit (HOSPITAL_BASED_OUTPATIENT_CLINIC_OR_DEPARTMENT_OTHER): Payer: Self-pay

## 2021-08-04 DIAGNOSIS — Z23 Encounter for immunization: Secondary | ICD-10-CM

## 2021-08-04 NOTE — Progress Notes (Signed)
   Covid-19 Vaccination Clinic  Name:  Tim Wilson    MRN: [id number] DOB: 12/26/1965  08/04/2021  [name] was observed post Covid-19 immunization for 15 minutes without incident. He was provided with Vaccine Information Sheet and instruction to access the V-Safe system.   [name] was instructed to call 911 with any severe reactions post vaccine: Difficulty breathing  Swelling of face and throat  A fast heartbeat  A bad rash all over body  Dizziness and weakness   Immunizations Administered     Name Date Dose VIS Date Route   Pfizer Covid-19 Vaccine Bivalent Booster 08/04/2021 12:34 PM 0.3 mL 06/03/2021 Intramuscular   Manufacturer: [employer] [place]   Lot: [id number]   [place]: [id number]

## 2021-08-12 ENCOUNTER — Ambulatory Visit: Payer: Managed Care, Other (non HMO) | Admitting: Physical Therapy

## 2021-08-18 ENCOUNTER — Other Ambulatory Visit: Payer: Self-pay

## 2021-08-18 ENCOUNTER — Ambulatory Visit: Payer: Managed Care, Other (non HMO) | Attending: Podiatry | Admitting: Physical Therapy

## 2021-08-18 ENCOUNTER — Encounter: Payer: Self-pay | Admitting: Physical Therapy

## 2021-08-18 DIAGNOSIS — M25572 Pain in left ankle and joints of left foot: Secondary | ICD-10-CM | POA: Diagnosis present

## 2021-08-18 DIAGNOSIS — M6281 Muscle weakness (generalized): Secondary | ICD-10-CM | POA: Diagnosis present

## 2021-08-18 DIAGNOSIS — R2689 Other abnormalities of gait and mobility: Secondary | ICD-10-CM | POA: Diagnosis present

## 2021-08-18 NOTE — Therapy (Signed)
[place], [place], [address] Phone: [phone]   Fax:  [phone]  Physical Therapy Treatment  Patient Details  Name: Tim Wilson MRN: [id number] Date of Birth: 1966-06-27 Referring Provider (PT): Criselda Peaches, [place]   Encounter Date: 08/18/2021   PT End of Session - 08/18/21 1052     Visit Number 2    Number of Visits 12    Date for PT Re-Evaluation 09/07/21    Authorization Type Generic Commercial    PT Start Time 1045    PT Stop Time 1130    PT Time Calculation (min) 45 min    Activity Tolerance Patient tolerated treatment well    Behavior During Therapy [facility] for tasks assessed/performed             Past Medical History:  Diagnosis Date   Diabetes mellitus    Hyperlipidemia    Hypertension     History reviewed. No pertinent surgical history.  There were no vitals filed for this visit.   Subjective Assessment - 08/18/21 1047     Subjective Patient reports he has been doing pretty good. He has been working on strengthening the left calf, states he has noticed a little bit of improvement. He denies any pain but does state he has some anxiety when doing his exercises because he is nervous the achilles will pop again.    Patient Stated Goals Get the ankle stronger    Currently in Pain? No/denies                [facility] PT Assessment - 08/18/21 0001       Strength   Left Ankle Plantar Flexion 2/5                      [facility] Adult PT Treatment/Exercise:  Therapeutic Exercise: Recumbent bike L2 x 5 min while taking subjective Blood flow restriction Position and location of cuff: Longistting, Seated and proximal thigh Limb occlusion pressure (mmHg): 220 Exercise pressure (mmHg): 176 Exercise prescription: 30,15,15,15, reps with 30-60 sec rest Exercise comment: Longsitting banded PF with blue, seated heel raises with 15# on edge of box  Manual  Therapy: N/A  Neuromuscular re-ed: SLS 3 x 30 sec - occasional eyes closed once he feels balanced for increased challenge  Therapeutic Activity: N/A  Modalities: N/A  Self Care: N/A               PT Education - 08/18/21 1051     Education Details HEP update, BFR rationale, FOTO    Person(s) Educated Patient    Methods Explanation;Demonstration;Verbal cues;Handout    Comprehension Verbalized understanding;Returned demonstration;Verbal cues required;Need further instruction              PT Short Term Goals - 08/18/21 1053       PT SHORT TERM GOAL #1   Title Patient will be I with initial HEP to progress with PT    Time 3    Period Weeks    Status On-going    Target Date 08/17/21      PT SHORT TERM GOAL #2   Title PT will review FOTO with patient by 3rd visit to understand expected progress    Baseline reviewed on 2nd visit    Time 3    Period Weeks    Status Achieved    Target Date 08/17/21               PT  Long Term Goals - 07/27/21 1230       PT LONG TERM GOAL #1   Title Patient will be I with final HEP to maintain progress from PT    Time 6    Period Weeks    Status New    Target Date 09/07/21      PT LONG TERM GOAL #2   Title Patient will report >/= 73% status on FOTO to indicate improved functional ability    Time 6    Period Weeks    Status New    Target Date 09/07/21      PT LONG TERM GOAL #3   Title Patient will demonstrate left calf strength >/= 4/5 MMT in order to return to basketball related activities    Time 6    Period Weeks    Status New    Target Date 09/07/21      PT LONG TERM GOAL #4   Title Patient will demonstrate normalized gait mechanics to reduce soreness with extended periods of walking    Time 6    Period Weeks    Status New    Target Date 09/07/21      PT LONG TERM GOAL #5   Title Patient will demonstrate >/= 30 sec SLS in order to demonstrate improved ankle/hip/core control    Time 6    Period  Weeks    Status New    Target Date 09/07/21                   Plan - 08/18/21 1052     Clinical Impression Statement Patient tolerated therapy well with no adverse effects. Incorporated blood fow restriction this visit to promote calf strengthening with good tolerance. Patient continues to demonstrate left calf weakness with inability to perform single leg heel raise. Also initiated balance training this visit with good tolerance. He demonstrates greater deficit on the left compared to right. Updated HEP for progression of resistance and balance. He would benefit from continued skilled PT in order to progress his strength and normalize gait mechanics to reduce soreness and maximize his functional ability.    PT Treatment/Interventions ADLs/Self Care Home Management;Aquatic Therapy;Cryotherapy;Electrical Stimulation;Iontophoresis 4mg /ml Dexamethasone;Moist Heat;Traction;Ultrasound;Gait training;Stair training;Functional mobility training;Therapeutic activities;Therapeutic exercise;Balance training;Neuromuscular re-education;Manual techniques;Patient/family education;Dry needling;Passive range of motion;Taping;Vasopneumatic Device;Spinal Manipulations;Joint Manipulations    PT Next Visit Plan Review HEP and progress PRN, progression of calf strength with BFR, balance training    PT Home Exercise Plan [email or web address]    Consulted and Agree with Plan of Care Patient             Patient will benefit from skilled therapeutic intervention in order to improve the following deficits and impairments:  Abnormal gait, Decreased activity tolerance, Decreased strength, Pain, Decreased balance  Visit Diagnosis: Pain in left ankle and joints of left foot  Muscle weakness (generalized)  Other abnormalities of gait and mobility     Problem List Patient Active Problem List   Diagnosis Date Noted   Diabetes mellitus type 1.5, managed as type 1 ([place]) 12/12/2020   Need for hepatitis C screening test  12/10/2020   Abnormal electrocardiogram (ECG) (EKG) 12/10/2020   Sensorineural hearing loss (SNHL) of both ears 01/28/2020   Tinnitus 01/28/2020   Tinnitus aurium, bilateral 11/15/2019   Obstructive sleep apnea syndrome 05/16/2019   Type 2 diabetes mellitus with complication, with long-term current use of insulin ([place]) 05/16/2019   Vitamin D deficiency disease 05/16/2019   Constipation 04/04/2018   Screen for colon  cancer 01/20/2017   Snoring 09/21/2016   Routine general medical examination at a health care facility 07/13/2012   Hyperlipidemia with target LDL less than 100 08/06/2010   MICROALBUMINURIA 07/27/2010   Type II diabetes mellitus with manifestations ([place]) 07/10/2010   HYPERTRIGLYCERIDEMIA 10/16/2009   Essential hypertension 10/16/2009    Hilda Blades, PT, DPT, LAT, ATC 08/18/21  11:38 AM Phone: [phone] Fax: [facility] [address] [place], [place], [address] Phone: [phone]   Fax:  [phone]  Name: MONICA ZAHLER MRN: [id number] Date of Birth: 09-11-1966

## 2021-08-18 NOTE — Patient Instructions (Signed)
Access Code: [email or web address] URL: [email or web address] Date: 08/18/2021 Prepared by: Hilda Blades  Exercises Long Sitting Ankle Plantar Flexion with Resistance - 2 x daily - 7 x weekly - 2-3 sets - 20 reps Seated Heel Raise - 2 x daily - 7 x weekly - 2-3 sets - 20 reps Isometric Heel Raise at Wall - 2 x daily - 7 x weekly - 10 reps - 10 seconds hold Single Leg Stance - 2 x daily - 7 x weekly - 3 sets - 30 hold

## 2021-08-20 ENCOUNTER — Encounter: Payer: Self-pay | Admitting: Physical Therapy

## 2021-08-20 ENCOUNTER — Ambulatory Visit: Payer: Managed Care, Other (non HMO) | Admitting: Physical Therapy

## 2021-08-20 ENCOUNTER — Other Ambulatory Visit: Payer: Self-pay

## 2021-08-20 DIAGNOSIS — R2689 Other abnormalities of gait and mobility: Secondary | ICD-10-CM

## 2021-08-20 DIAGNOSIS — M25572 Pain in left ankle and joints of left foot: Secondary | ICD-10-CM | POA: Diagnosis not present

## 2021-08-20 DIAGNOSIS — M6281 Muscle weakness (generalized): Secondary | ICD-10-CM

## 2021-08-20 NOTE — Therapy (Addendum)
[place], [place], [address] Phone: [phone]   Fax:  [phone]  Physical Therapy Treatment / Discharge  Patient Details  Name: Tim Wilson MRN: [id number] Date of Birth: June 28, 1966 Referring Provider (PT): Criselda Peaches, [place]   Encounter Date: 08/20/2021   PT End of Session - 08/20/21 1052     Visit Number 3    Number of Visits 12    Date for PT Re-Evaluation 09/07/21    Authorization Type Generic Commercial    PT Start Time 1047    PT Stop Time 1130    PT Time Calculation (min) 43 min    Activity Tolerance Patient tolerated treatment well    Behavior During Therapy [facility] for tasks assessed/performed             Past Medical History:  Diagnosis Date   Diabetes mellitus    Hyperlipidemia    Hypertension     History reviewed. No pertinent surgical history.  There were no vitals filed for this visit.   Subjective Assessment - 08/20/21 1049     Subjective Patient reports he is doing well, states no complaints today.    Patient Stated Goals Get the ankle stronger    Currently in Pain? No/denies                     [facility] Adult PT Treatment/Exercise:   Therapeutic Exercise: Recumbent bike L2 x 5 min while taking subjective Pilates reformer SL heel raise (platform) x 15 with blue spring, 3 x 15 with green spring Longsitting ankle PF with black 3 x 20 Seated heel raise with 25# 3 x 20 SL leg press (cybex) 60# 3 x 8   Manual Therapy: N/A   Neuromuscular re-ed: SLS on Airex 3 x 30 sec each   Therapeutic Activity: N/A   Modalities: N/A   Self Care: N/A                   PT Education - 08/20/21 1051     Education Details HEP, progressed to black band    Person(s) Educated Patient    Methods Explanation;Demonstration;Verbal cues    Comprehension Verbalized understanding;Returned demonstration;Verbal cues required;Need further instruction               PT Short Term Goals - 08/18/21 1053       PT SHORT TERM GOAL #1   Title Patient will be I with initial HEP to progress with PT    Time 3    Period Weeks    Status On-going    Target Date 08/17/21      PT SHORT TERM GOAL #2   Title PT will review FOTO with patient by 3rd visit to understand expected progress    Baseline reviewed on 2nd visit    Time 3    Period Weeks    Status Achieved    Target Date 08/17/21               PT Long Term Goals - 07/27/21 1230       PT LONG TERM GOAL #1   Title Patient will be I with final HEP to maintain progress from PT    Time 6    Period Weeks    Status New    Target Date 09/07/21      PT LONG TERM GOAL #2   Title Patient will report >/= 73% status on FOTO to indicate improved  functional ability    Time 6    Period Weeks    Status New    Target Date 09/07/21      PT LONG TERM GOAL #3   Title Patient will demonstrate left calf strength >/= 4/5 MMT in order to return to basketball related activities    Time 6    Period Weeks    Status New    Target Date 09/07/21      PT LONG TERM GOAL #4   Title Patient will demonstrate normalized gait mechanics to reduce soreness with extended periods of walking    Time 6    Period Weeks    Status New    Target Date 09/07/21      PT LONG TERM GOAL #5   Title Patient will demonstrate >/= 30 sec SLS in order to demonstrate improved ankle/hip/core control    Time 6    Period Weeks    Status New    Target Date 09/07/21                   Plan - 08/20/21 1052     Clinical Impression Statement Patient tolerated therapy well with no adverse effects. Therapy focused on progressing calf strength, incorporating the pilates reformer to perform SL heel raises with less than body weight. Patient continues to demonstrate a gross strength deficit of the calf but is tolerating progressions in resistance and was able to progress to SLS on unstable surface this visit. He  would benefit from continued skilled PT in order to progress his strength and normalize gait mechanics to reduce soreness and maximize his functional ability.    PT Treatment/Interventions ADLs/Self Care Home Management;Aquatic Therapy;Cryotherapy;Electrical Stimulation;Iontophoresis 8m/ml Dexamethasone;Moist Heat;Traction;Ultrasound;Gait training;Stair training;Functional mobility training;Therapeutic activities;Therapeutic exercise;Balance training;Neuromuscular re-education;Manual techniques;Patient/family education;Dry needling;Passive range of motion;Taping;Vasopneumatic Device;Spinal Manipulations;Joint Manipulations    PT Next Visit Plan Review HEP and progress PRN, progression of calf strength with BFR, balance training    PT Home Exercise Plan [id number]    Consulted and Agree with Plan of Care Patient             Patient will benefit from skilled therapeutic intervention in order to improve the following deficits and impairments:  Abnormal gait, Decreased activity tolerance, Decreased strength, Pain, Decreased balance  Visit Diagnosis: Pain in left ankle and joints of left foot  Muscle weakness (generalized)  Other abnormalities of gait and mobility     Problem List Patient Active Problem List   Diagnosis Date Noted   Diabetes mellitus type 1.5, managed as type 1 ([place] 12/12/2020   Need for hepatitis C screening test 12/10/2020   Abnormal electrocardiogram (ECG) (EKG) 12/10/2020   Sensorineural hearing loss (SNHL) of both ears 01/28/2020   Tinnitus 01/28/2020   Tinnitus aurium, bilateral 11/15/2019   Obstructive sleep apnea syndrome 05/16/2019   Type 2 diabetes mellitus with complication, with long-term current use of insulin ([place] 05/16/2019   Vitamin D deficiency disease 05/16/2019   Constipation 04/04/2018   Screen for colon cancer 01/20/2017   Snoring 09/21/2016   Routine general medical examination at a health care facility 07/13/2012   Hyperlipidemia with target  LDL less than 100 08/06/2010   MICROALBUMINURIA 07/27/2010   Type II diabetes mellitus with manifestations ([place] 07/10/2010   HYPERTRIGLYCERIDEMIA 10/16/2009   Essential hypertension 10/16/2009    CHilda Blades PT, DPT, LAT, ATC 08/20/21  11:31 AM Phone: [phone]: 3[facility] [address]  Phone: [phone]   Fax:  [phone]  Name: DEANGLO HISSONG MRN: [id number] Date of Birth: Feb 17, 1966   PHYSICAL THERAPY DISCHARGE SUMMARY  Visits from Start of Care: 3  Current functional level related to goals / functional outcomes: See above   Remaining deficits: See above   Education / Equipment: HEP   Patient agrees to discharge. Patient goals were partially met. Patient is being discharged due to not returning since the last visit.

## 2021-08-24 ENCOUNTER — Other Ambulatory Visit (HOSPITAL_BASED_OUTPATIENT_CLINIC_OR_DEPARTMENT_OTHER): Payer: Self-pay

## 2021-08-24 MED ORDER — PFIZER COVID-19 VAC BIVALENT 30 MCG/0.3ML IM SUSP
INTRAMUSCULAR | 0 refills | Status: DC
  Filled 2021-08-24: qty 0.3, 1d supply, fill #0

## 2021-08-25 ENCOUNTER — Ambulatory Visit: Payer: Managed Care, Other (non HMO) | Admitting: Physical Therapy

## 2021-09-01 ENCOUNTER — Encounter: Payer: No Typology Code available for payment source | Admitting: Physical Therapy

## 2021-09-03 ENCOUNTER — Encounter: Payer: No Typology Code available for payment source | Admitting: Physical Therapy

## 2021-09-08 ENCOUNTER — Ambulatory Visit (INDEPENDENT_AMBULATORY_CARE_PROVIDER_SITE_OTHER): Payer: Self-pay | Admitting: Podiatry

## 2021-09-08 DIAGNOSIS — Z91199 Patient's noncompliance with other medical treatment and regimen due to unspecified reason: Secondary | ICD-10-CM

## 2021-09-09 ENCOUNTER — Telehealth: Payer: Self-pay | Admitting: Physical Therapy

## 2021-09-09 ENCOUNTER — Ambulatory Visit: Payer: Managed Care, Other (non HMO) | Attending: Podiatry | Admitting: Physical Therapy

## 2021-09-09 NOTE — Telephone Encounter (Signed)
Attempted to contact patient due to missed PT appointment. Left VM informing patient of missed appointment and reminder of next scheduled appointment. Advised patient of attendance policy.  Hilda Blades, PT, DPT, LAT, ATC 09/09/21  1:25 PM Phone: [phone] Fax: [phone]

## 2021-09-09 NOTE — Progress Notes (Signed)
Patient was no-show for appointment today 

## 2021-09-11 ENCOUNTER — Encounter: Payer: No Typology Code available for payment source | Admitting: Physical Therapy

## 2021-09-14 ENCOUNTER — Telehealth: Payer: Self-pay | Admitting: Physical Therapy

## 2021-09-14 NOTE — Telephone Encounter (Signed)
Attempted to contact patient due to missed PT appointment on 09/11/2021. Informed patient of missed appointment and next scheduled appointment on 09/15/2021. Patient advised that if he misses his next appointment then he will be discharged from PT per the attendance policy so encouraged to call to cancel/reschedule if needed.  Hilda Blades, PT, DPT, LAT, ATC 09/14/21  3:25 PM Phone: [phone] Fax: [phone]

## 2021-09-15 ENCOUNTER — Ambulatory Visit: Payer: Managed Care, Other (non HMO) | Admitting: Physical Therapy

## 2021-09-17 ENCOUNTER — Encounter: Payer: No Typology Code available for payment source | Admitting: Physical Therapy

## 2021-09-22 ENCOUNTER — Encounter: Payer: No Typology Code available for payment source | Admitting: Physical Therapy

## 2021-09-24 ENCOUNTER — Encounter: Payer: No Typology Code available for payment source | Admitting: Physical Therapy

## 2021-09-29 ENCOUNTER — Encounter: Payer: No Typology Code available for payment source | Admitting: Physical Therapy

## 2021-10-01 ENCOUNTER — Encounter: Payer: No Typology Code available for payment source | Admitting: Physical Therapy

## 2021-10-14 ENCOUNTER — Other Ambulatory Visit: Payer: Self-pay | Admitting: Internal Medicine

## 2021-10-14 ENCOUNTER — Other Ambulatory Visit (HOSPITAL_BASED_OUTPATIENT_CLINIC_OR_DEPARTMENT_OTHER): Payer: Self-pay

## 2021-10-14 DIAGNOSIS — E781 Pure hyperglyceridemia: Secondary | ICD-10-CM

## 2021-10-14 DIAGNOSIS — E118 Type 2 diabetes mellitus with unspecified complications: Secondary | ICD-10-CM

## 2021-10-14 DIAGNOSIS — E785 Hyperlipidemia, unspecified: Secondary | ICD-10-CM

## 2021-10-14 MED ORDER — ROSUVASTATIN CALCIUM 20 MG PO TABS
20.0000 mg | ORAL_TABLET | Freq: Every day | ORAL | 0 refills | Status: DC
  Filled 2021-10-14: qty 30, 30d supply, fill #0
  Filled 2021-12-24: qty 30, 30d supply, fill #1
  Filled 2022-03-03: qty 30, 30d supply, fill #2

## 2021-10-14 MED ORDER — FENOFIBRATE 145 MG PO TABS
145.0000 mg | ORAL_TABLET | Freq: Every day | ORAL | 0 refills | Status: DC
  Filled 2021-10-14: qty 30, 30d supply, fill #0
  Filled 2021-12-24: qty 30, 30d supply, fill #1
  Filled 2022-03-03: qty 30, 30d supply, fill #2

## 2021-10-14 MED FILL — Olmesartan Medoxomil Tab 40 MG: ORAL | 30 days supply | Qty: 30 | Fill #5 | Status: AC

## 2021-10-19 ENCOUNTER — Other Ambulatory Visit (HOSPITAL_BASED_OUTPATIENT_CLINIC_OR_DEPARTMENT_OTHER): Payer: Self-pay

## 2021-10-19 MED FILL — Icosapent Ethyl Cap 1 GM: ORAL | 30 days supply | Qty: 120 | Fill #1 | Status: AC

## 2021-10-20 ENCOUNTER — Other Ambulatory Visit (HOSPITAL_BASED_OUTPATIENT_CLINIC_OR_DEPARTMENT_OTHER): Payer: Self-pay

## 2021-10-30 ENCOUNTER — Other Ambulatory Visit (HOSPITAL_BASED_OUTPATIENT_CLINIC_OR_DEPARTMENT_OTHER): Payer: Self-pay

## 2021-10-30 ENCOUNTER — Other Ambulatory Visit: Payer: Self-pay

## 2021-10-30 ENCOUNTER — Ambulatory Visit: Payer: Managed Care, Other (non HMO) | Admitting: Endocrinology

## 2021-10-30 ENCOUNTER — Encounter: Payer: Self-pay | Admitting: Endocrinology

## 2021-10-30 VITALS — BP 140/84 | HR 76 | Ht 68.0 in | Wt 201.4 lb

## 2021-10-30 DIAGNOSIS — Z794 Long term (current) use of insulin: Secondary | ICD-10-CM

## 2021-10-30 DIAGNOSIS — E118 Type 2 diabetes mellitus with unspecified complications: Secondary | ICD-10-CM

## 2021-10-30 DIAGNOSIS — E781 Pure hyperglyceridemia: Secondary | ICD-10-CM

## 2021-10-30 LAB — POCT GLYCOSYLATED HEMOGLOBIN (HGB A1C): Hemoglobin A1C: 9.8 % — AB (ref 4.0–5.6)

## 2021-10-30 MED ORDER — INSULIN LISPRO (1 UNIT DIAL) 100 UNIT/ML (KWIKPEN)
10.0000 [IU] | PEN_INJECTOR | Freq: Every day | SUBCUTANEOUS | 11 refills | Status: DC
  Filled 2021-10-30: qty 3, 30d supply, fill #0

## 2021-10-30 MED ORDER — FREESTYLE LIBRE 2 SENSOR MISC
1.0000 | 3 refills | Status: DC
  Filled 2021-10-30: qty 2, 28d supply, fill #0

## 2021-10-30 MED ORDER — NOVOLOG FLEXPEN 100 UNIT/ML ~~LOC~~ SOPN
10.0000 [IU] | PEN_INJECTOR | Freq: Every day | SUBCUTANEOUS | 11 refills | Status: DC
  Filled 2021-10-30: qty 3, 30d supply, fill #0

## 2021-10-30 MED ORDER — TECHLITE PEN NEEDLES 32G X 6 MM MISC
3 refills | Status: DC
  Filled 2021-10-30: qty 100, 50d supply, fill #0
  Filled 2022-03-05: qty 100, 50d supply, fill #1

## 2021-10-30 MED ORDER — INSULIN GLARGINE (2 UNIT DIAL) 300 UNIT/ML ~~LOC~~ SOPN
70.0000 [IU] | PEN_INJECTOR | SUBCUTANEOUS | 3 refills | Status: DC
  Filled 2021-10-30: qty 6, 25d supply, fill #0

## 2021-10-30 MED ORDER — INSULIN GLARGINE (2 UNIT DIAL) 300 UNIT/ML ~~LOC~~ SOPN
50.0000 [IU] | PEN_INJECTOR | SUBCUTANEOUS | 3 refills | Status: DC
  Filled 2021-10-30: qty 54, 324d supply, fill #0

## 2021-10-30 NOTE — Patient Instructions (Addendum)
On this type of insulin schedule, you should eat meals on a regular schedule.  If a meal is missed or significantly delayed, your blood sugar could go low.   check your blood sugar twice a day.  vary the time of day when you check, between before the 3 meals, and at bedtime.  also check if you have symptoms of your blood sugar being too high or too low.  please keep a record of the readings and bring it to your next appointment here (or you can bring the meter itself).  You can write it on any piece of paper.  please call us sooner if your blood sugar goes below 70, or if you have a lot of readings over 200.   Please take 70 units of Toujeo, and 10 units of Novolog, but with breakfast.   Please come back for a follow-up appointment in 2 months.

## 2021-10-30 NOTE — Progress Notes (Signed)
Subjective:    Patient ID: Tim Wilson, male    DOB: [birth year], 56 y.o.   MRN: [id number]  HPI Pt returns for f/u of diabetes mellitus: DM type: Insulin-requiring type 2.   Dx'ed: 6568 Complications: none Therapy: insulin since 2019.   DKA: never Severe hypoglycemia: never.  Pancreatitis: once, in 2019 SDOH: he could not afford Rybelsus; he works 9AM-4PM Other: he also took insulin in 2012, and 2014-2015; he did not tolerate metformin-XR or invokana (diarrhea with both); he stopped pioglitizone due to fatigue and DOE; he works 1st shift; he takes QD insulin, after poor results with multiple daily injections; he also has hypertriglyceridemia.  Interval hx:  Pt says he never misses the insulin. He takes toujeo 50 qam, and Novolog, 10 units after the evening meal.  I reviewed continuous glucose monitor data.  Glucose varies from 150-400.  It is in general highest at 4-5PM, and lowest at 2AM.  It increases throughout the morning and early afternoon.   Past Medical History:  Diagnosis Date   Diabetes mellitus    Hyperlipidemia    Hypertension     No past surgical history on file.  Social History   Socioeconomic History   Marital status: Married    Spouse name: Not on file   Number of children: 5   Years of education: Not on file   Highest education level: Not on file  Occupational History   Occupation: Restaurant manager, fast food: [employer]    Comment: coaches basketball at [employer]  Tobacco Use   Smoking status: Never   Smokeless tobacco: Never  Vaping Use   Vaping Use: Never used  Substance and Sexual Activity   Alcohol use: Yes    Alcohol/week: 2.0 standard drinks    Types: 2 Cans of beer per week    Comment: on occasion, not in excess   Drug use: No   Sexual activity: Yes  Other Topics Concern   Not on file  Social History Narrative   Regular exercise-yes   Social Determinants of Health   Financial Resource Strain: Not on file   Food Insecurity: Not on file  Transportation Needs: Not on file  Physical Activity: Not on file  Stress: Not on file  Social Connections: Not on file  Intimate Partner Violence: Not on file    Current Outpatient Medications on File Prior to Visit  Medication Sig Dispense Refill   Accu-Chek Softclix Lancets lancets Use to check blood sugar twice a day 200 each 3   aspirin EC 81 MG tablet Take 1 tablet (81 mg total) by mouth daily. 90 tablet 1   Cholecalciferol 1.25 MG (50000 UT) capsule Take 1 capsule (50,000 Units total) by mouth once a week. 12 capsule 0   COVID-19 mRNA bivalent vaccine, Pfizer, (PFIZER COVID-19 VAC BIVALENT) injection Inject into the muscle. 0.3 mL 0   fenofibrate (TRICOR) 145 MG tablet Take 1 tablet (145 mg total) by mouth daily. 90 tablet 0   Glucagon 1 MG/0.2ML SOAJ INJECT UNDER THE SKIN INTO THE SKIN DAILY AS NEEDED .4 mL 5   glucose blood (ACCU-CHEK GUIDE) test strip Use to check blood sugar twice a day 200 each 3   icosapent Ethyl (VASCEPA) 1 g capsule TAKE 2 CAPSULES (2 G TOTAL) BY MOUTH 2 (TWO) TIMES DAILY. 360 capsule 1   metoprolol succinate (TOPROL-XL) 25 MG 24 hr tablet Take one-half tablet (12.5 mg total) by mouth daily. Take with or immediately following  a meal. 45 tablet 3   olmesartan (BENICAR) 40 MG tablet TAKE 1 TABLET (40 MG TOTAL) BY MOUTH DAILY. 90 tablet 3   rosuvastatin (CRESTOR) 20 MG tablet Take 1 tablet (20 mg total) by mouth daily. 90 tablet 0   No current facility-administered medications on file prior to visit.    Allergies  Allergen Reactions   Metformin And Related Diarrhea   Ramipril     cough    Family History  Problem Relation Age of Onset   Heart disease Father    Heart attack Father 67   Heart disease Brother    Heart attack Brother 62   Diabetes Mother    Heart attack Mother    Breast cancer Cousin    Heart attack Paternal Uncle    Heart attack Paternal Uncle    Stroke Paternal Uncle    Heart attack Paternal Uncle  80       5 HA in the past    Colon cancer Neg Hx    Stomach cancer Neg Hx    Esophageal cancer Neg Hx     BP 140/84    Pulse 76    Ht 5\' 8"  (1.727 m)    Wt 201 lb 6.4 oz (91.4 kg)    SpO2 99%    BMI 30.62 kg/m    Review of Systems     Objective:   Physical Exam    Lab Results  Component Value Date   HGBA1C 9.8 (A) 10/30/2021      Assessment & Plan:  Insulin-requiring type 2 DM: uncontrolled   Patient Instructions  On this type of insulin schedule, you should eat meals on a regular schedule.  If a meal is missed or significantly delayed, your blood sugar could go low.   check your blood sugar twice a day.  vary the time of day when you check, between before the 3 meals, and at bedtime.  also check if you have symptoms of your blood sugar being too high or too low.  please keep a record of the readings and bring it to your next appointment here (or you can bring the meter itself).  You can write it on any piece of paper.  please call us sooner if your blood sugar goes below 70, or if you have a lot of readings over 200.   Please take 70 units of Toujeo, and 10 units of Novolog, but with breakfast.   Please come back for a follow-up appointment in 2 months.

## 2021-12-21 ENCOUNTER — Ambulatory Visit: Payer: Managed Care, Other (non HMO) | Admitting: Student

## 2021-12-21 ENCOUNTER — Encounter: Payer: Self-pay | Admitting: Student

## 2021-12-21 ENCOUNTER — Other Ambulatory Visit: Payer: Self-pay

## 2021-12-21 VITALS — BP 142/85 | HR 76 | Temp 97.0°F | Resp 17 | Ht 68.0 in | Wt 207.8 lb

## 2021-12-21 DIAGNOSIS — I10 Essential (primary) hypertension: Secondary | ICD-10-CM

## 2021-12-21 DIAGNOSIS — R931 Abnormal findings on diagnostic imaging of heart and coronary circulation: Secondary | ICD-10-CM

## 2021-12-21 DIAGNOSIS — E7800 Pure hypercholesterolemia, unspecified: Secondary | ICD-10-CM

## 2021-12-21 NOTE — Progress Notes (Signed)
? ?Primary Physician/Referring:  Janith Lima, MD ? ?Patient ID: Mauri Reading, male    DOB: May 22, 1966, 56 y.o.   MRN: [id number] ? ?Chief Complaint  ?Patient presents with  ? Follow-up  ?  6 MONTH  ? HLD  ? ELEVATED CA SCORE  ? ?HPI:   ? ?BENITO LEMMERMAN  is a 56 y.o. patient originally referred by Dr. Scarlette Calico for evaluation of abnormal EKG and in view of multiple cardiovascular risk factors.  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  Patient also has strong family history of premature CAD in father with MI at age 7 and brother with MI at age 74.  Patient also follows with Dr. Renato Shin for hypercalcemia. ? ?Since establishing care with our office patient has undergone coronary calcium score with total score of 322 placing her in the 97th percentile.  Echocardiogram was without significant abnormalities and stress test overall low risk.  Patient has been followed by our office for aggressive risk factor modification. ? ?Patient presents for 56-monthfollow-up.  Unfortunately repeat lipid profile testing has not been done as ordered at last office visit.  Patient is however feeling well overall.  He remains active coaching basketball.  He continues to follow with Dr. ELoanne Drillingfor management of diabetes, most recent A1c in January 2023 was 9.8%.  Patient denies chest pain, dyspnea, syncope, near syncope, leg swelling, orthopnea, PND. ? ?Past Medical History:  ?Diagnosis Date  ? Diabetes mellitus   ? Hyperlipidemia   ? Hypertension   ? ?History reviewed. No pertinent surgical history. ?Family History  ?Problem Relation Age of Onset  ? Heart disease Father   ? Heart attack Father 416 ? Heart disease Brother   ? Heart attack Brother 319 ? Diabetes Mother   ? Heart attack Mother   ? Breast cancer Cousin   ? Heart attack Paternal Uncle   ? Heart attack Paternal Uncle   ? Stroke Paternal Uncle   ? Heart attack  Paternal Uncle 825 ?     5 HA in the past   ? Colon cancer Neg Hx   ? Stomach cancer Neg Hx   ? Esophageal cancer Neg Hx   ?  ?Social History  ? ?Tobacco Use  ? Smoking status: Never  ? Smokeless tobacco: Never  ?Substance Use Topics  ? Alcohol use: Yes  ?  Alcohol/week: 2.0 standard drinks  ?  Types: 2 Cans of beer per week  ?  Comment: on occasion, not in excess  ? ?Marital Status: Married  ?ROS  ?Review of Systems  ?Cardiovascular:  Negative for chest pain, claudication, dyspnea on exertion, leg swelling, near-syncope, orthopnea, palpitations, paroxysmal nocturnal dyspnea and syncope.  ?Respiratory:  Negative for shortness of breath.   ?Objective  ?Blood pressure (!) 142/85, pulse 76, temperature (!) 97 ?F (36.1 ?C), temperature source Temporal, resp. rate 17, height '5\' 8"'$  (1.727 m), weight 207 lb 12.8 oz (94.3 kg), SpO2 98 %.  ?Vitals with BMI 12/21/2021 12/21/2021 10/30/2021  ?Height - '5\' 8"'$  '5\' 8"'$   ?Weight - 207 lbs 13 oz 201 lbs 6 oz  ?BMI - 31.6 30.63  ?Systolic [id number] ?Diastolic 85 85 84  ?Pulse 76 80 76  ?  ? Physical Exam ?Vitals reviewed.  ?Cardiovascular:  ?   Rate and Rhythm: Normal rate and regular rhythm.  ?   Pulses: Intact distal pulses.  ?   Heart  sounds: Normal heart sounds, S1 normal and S2 normal. No murmur heard. ?  No gallop.  ?   Comments: No leg edema, no JVD. ?Pulmonary:  ?   Effort: Pulmonary effort is normal. No respiratory distress.  ?   Breath sounds: Normal breath sounds. No wheezing, rhonchi or rales.  ?Musculoskeletal:  ?   Right lower leg: No edema.  ?   Left lower leg: No edema.  ?Neurological:  ?   Mental Status: He is alert.  ? ?Laboratory examination:  ? ?Recent Labs  ?  06/23/21 ?1006  ?NA 137  ?K 4.3  ?CL 100  ?CO2 19*  ?GLUCOSE 135*  ?BUN 19  ?CREATININE 1.01  ?CALCIUM 10.3*  ? ?CrCl cannot be calculated (Patient's most recent lab result is older than the maximum 21 days allowed.).  ?CMP Latest Ref Rng & Units 06/23/2021 12/11/2020 03/25/2020  ?Glucose 65 - 99 mg/dL 135(H)  375(H) 336(H)  ?BUN 6 - 24 mg/dL '19 13 12  '$ ?Creatinine 0.76 - 1.27 mg/dL 1.01 1.23 1.07  ?Sodium 134 - 144 mmol/L 137 152(H) 135  ?Potassium 3.5 - 5.2 mmol/L 4.3 4.6 3.9  ?Chloride 96 - 106 mmol/L 100 108 98  ?CO2 20 - 29 mmol/L 19(L) 16(L) 28  ?Calcium 8.7 - 10.2 mg/dL 10.3(H) 11.0(H) 10.1  ?Total Protein 6.1 - 8.1 g/dL - 8.9(H) -  ?Total Bilirubin 0.2 - 1.2 mg/dL - 0.2 -  ?Alkaline Phos 39 - 117 U/L - - -  ?AST 10 - 35 U/L - 15 -  ?ALT 9 - 46 U/L - 16 -  ? ?CBC Latest Ref Rng & Units 12/11/2020 05/15/2019 05/01/2018  ?WBC 3.8 - 10.8 Thousand/uL 8.5 5.1 6.2  ?Hemoglobin 13.2 - 17.1 g/dL 14.2 14.3 13.0  ?Hematocrit 38.5 - 50.0 % 40.2 42.4 38.1(L)  ?Platelets 140 - 400 Thousand/uL 300 279.0 308.0  ? ?Urine dipstick shows negative for all components, positive for protein.  ? ?Lipid Panel ?Recent Labs  ?  06/23/21 ?1007  ?CHOL 218*  ?TRIG 420*  ?LDLCALC 115*  ?HDL 30*  ? ?HEMOGLOBIN A1C ?Lab Results  ?Component Value Date  ? HGBA1C 9.8 (A) 10/30/2021  ? MPG 295 12/11/2020  ? ?TSH ?No results for input(s): TSH in the last 8760 hours. ? ?Allergies  ? ?Allergies  ?Allergen Reactions  ? Metformin And Related Diarrhea  ? Ramipril   ?  cough  ?  ?Medications Prior to Visit:  ? ?Outpatient Medications Prior to Visit  ?Medication Sig Dispense Refill  ? Accu-Chek Softclix Lancets lancets Use to check blood sugar twice a day 200 each 3  ? aspirin EC 81 MG tablet Take 1 tablet (81 mg total) by mouth daily. 90 tablet 1  ? Cholecalciferol 1.25 MG (50000 UT) capsule Take 1 capsule (50,000 Units total) by mouth once a week. 12 capsule 0  ? Continuous Blood Gluc Sensor (FREESTYLE LIBRE 2 SENSOR) MISC Use to check blood sugar and change every 14 days 6 each 3  ? COVID-19 mRNA bivalent vaccine, Pfizer, (PFIZER COVID-19 VAC BIVALENT) injection Inject into the muscle. 0.3 mL 0  ? fenofibrate (TRICOR) 145 MG tablet Take 1 tablet (145 mg total) by mouth daily. 90 tablet 0  ? glucose blood (ACCU-CHEK GUIDE) test strip Use to check blood sugar  twice a day 200 each 3  ? icosapent Ethyl (VASCEPA) 1 g capsule TAKE 2 CAPSULES (2 G TOTAL) BY MOUTH 2 (TWO) TIMES DAILY. 360 capsule 1  ? insulin glargine, 2 Unit Dial, (TOUJEO MAX)  300 UNIT/ML Solostar Pen Inject 70 Units into the skin every morning. And pen needles 2/day 24 mL 3  ? insulin lispro (HUMALOG KWIKPEN) 100 UNIT/ML KwikPen Inject 10 Units into the skin daily with breakfast. 15 mL 11  ? Insulin Pen Needle (TECHLITE PEN NEEDLES) 32G X 6 MM MISC Use as directed to inject twice a day. 100 each 3  ? metoprolol succinate (TOPROL-XL) 25 MG 24 hr tablet Take one-half tablet (12.5 mg total) by mouth daily. Take with or immediately following a meal. 45 tablet 3  ? olmesartan (BENICAR) 40 MG tablet TAKE 1 TABLET (40 MG TOTAL) BY MOUTH DAILY. 90 tablet 3  ? rosuvastatin (CRESTOR) 20 MG tablet Take 1 tablet (20 mg total) by mouth daily. 90 tablet 0  ? ?No facility-administered medications prior to visit.  ? ?Final Medications at End of Visit   ? ?Current Meds  ?Medication Sig  ? Accu-Chek Softclix Lancets lancets Use to check blood sugar twice a day  ? aspirin EC 81 MG tablet Take 1 tablet (81 mg total) by mouth daily.  ? Cholecalciferol 1.25 MG (50000 UT) capsule Take 1 capsule (50,000 Units total) by mouth once a week.  ? Continuous Blood Gluc Sensor (FREESTYLE LIBRE 2 SENSOR) MISC Use to check blood sugar and change every 14 days  ? COVID-19 mRNA bivalent vaccine, Pfizer, (PFIZER COVID-19 VAC BIVALENT) injection Inject into the muscle.  ? fenofibrate (TRICOR) 145 MG tablet Take 1 tablet (145 mg total) by mouth daily.  ? glucose blood (ACCU-CHEK GUIDE) test strip Use to check blood sugar twice a day  ? icosapent Ethyl (VASCEPA) 1 g capsule TAKE 2 CAPSULES (2 G TOTAL) BY MOUTH 2 (TWO) TIMES DAILY.  ? insulin glargine, 2 Unit Dial, (TOUJEO MAX) 300 UNIT/ML Solostar Pen Inject 70 Units into the skin every morning. And pen needles 2/day  ? insulin lispro (HUMALOG KWIKPEN) 100 UNIT/ML KwikPen Inject 10 Units into the  skin daily with breakfast.  ? Insulin Pen Needle (TECHLITE PEN NEEDLES) 32G X 6 MM MISC Use as directed to inject twice a day.  ? metoprolol succinate (TOPROL-XL) 25 MG 24 hr tablet Take one-half tablet (12

## 2021-12-24 ENCOUNTER — Other Ambulatory Visit (HOSPITAL_BASED_OUTPATIENT_CLINIC_OR_DEPARTMENT_OTHER): Payer: Self-pay

## 2021-12-24 MED FILL — Olmesartan Medoxomil Tab 40 MG: ORAL | 30 days supply | Qty: 30 | Fill #6 | Status: AC

## 2021-12-29 LAB — LDL CHOLESTEROL, DIRECT: LDL Direct: 51 mg/dL (ref 0–99)

## 2021-12-29 LAB — LIPID PANEL WITH LDL/HDL RATIO
Cholesterol, Total: 240 mg/dL — ABNORMAL HIGH (ref 100–199)
HDL: 19 mg/dL — ABNORMAL LOW (ref >39)
Triglycerides: 1563 mg/dL (ref 0–149)

## 2021-12-29 LAB — LIPOPROTEIN A (LPA)

## 2021-12-31 LAB — HM DIABETES EYE EXAM

## 2021-12-31 NOTE — Progress Notes (Signed)
Called pt no answer left a vm

## 2022-01-01 NOTE — Progress Notes (Signed)
Called pt, no answer. Left vm requesting call back?

## 2022-01-01 NOTE — Progress Notes (Signed)
Pt called back. Pt is aware.

## 2022-02-05 ENCOUNTER — Ambulatory Visit (INDEPENDENT_AMBULATORY_CARE_PROVIDER_SITE_OTHER): Payer: Managed Care, Other (non HMO) | Admitting: Endocrinology

## 2022-02-05 ENCOUNTER — Encounter: Payer: Self-pay | Admitting: Endocrinology

## 2022-02-05 ENCOUNTER — Other Ambulatory Visit (HOSPITAL_BASED_OUTPATIENT_CLINIC_OR_DEPARTMENT_OTHER): Payer: Self-pay

## 2022-02-05 VITALS — BP 148/92 | HR 78 | Ht 68.0 in | Wt 204.0 lb

## 2022-02-05 DIAGNOSIS — Z794 Long term (current) use of insulin: Secondary | ICD-10-CM | POA: Diagnosis not present

## 2022-02-05 DIAGNOSIS — E781 Pure hyperglyceridemia: Secondary | ICD-10-CM

## 2022-02-05 DIAGNOSIS — E118 Type 2 diabetes mellitus with unspecified complications: Secondary | ICD-10-CM

## 2022-02-05 LAB — POCT GLYCOSYLATED HEMOGLOBIN (HGB A1C): Hemoglobin A1C: 11.2 % — AB (ref 4.0–5.6)

## 2022-02-05 MED ORDER — INSULIN GLARGINE (2 UNIT DIAL) 300 UNIT/ML ~~LOC~~ SOPN
80.0000 [IU] | PEN_INJECTOR | SUBCUTANEOUS | 1 refills | Status: DC
  Filled 2022-02-05: qty 6, 22d supply, fill #0

## 2022-02-05 NOTE — Patient Instructions (Addendum)
On this type of insulin schedule, you should eat meals on a regular schedule.  If a meal is missed or significantly delayed, your blood sugar could go low.   ?check your blood sugar twice a day.  vary the time of day when you check, between before the 3 meals, and at bedtime.  also check if you have symptoms of your blood sugar being too high or too low.  please keep a record of the readings and bring it to your next appointment here (or you can bring the meter itself).  You can write it on any piece of paper.  please call us sooner if your blood sugar goes below 70, or if you have a lot of readings over 200.    ?Please take 80 units of Toujeo, and 10 units of Novolog, both with breakfast.   ?You should have an endocrinology follow-up appointment in 2 months.   ?

## 2022-02-05 NOTE — Progress Notes (Signed)
? ?Subjective:  ? ? Patient ID: Tim Wilson, male    DOB: 1966-02-11, 56 y.o.   MRN: [id number] ? ?HPI ?Pt returns for f/u of diabetes mellitus: ?DM type: Insulin-requiring type 2.   ?Dx'ed: 2012 ?Complications: none ?Therapy: insulin since 2019.   ?DKA: never ?Severe hypoglycemia: never.  ?Pancreatitis: once, in 2019 ?SDOH: he could not afford Rybelsus; he works 9AM-4PM ?Other: he also took insulin in 2012, and 2014-2015; he did not tolerate metformin-XR or invokana (diarrhea with both); he stopped pioglitizone due to fatigue and DOE; he works 1st shift; he takes QD insulin, after poor results with multiple daily injections; he also has hypertriglyceridemia.  ?Interval hx: He has not recently used continuous glucose monitor.  He takes insulin as rx'ed.  pt states he feels well in general.   ?Past Medical History:  ?Diagnosis Date  ? Diabetes mellitus   ? Hyperlipidemia   ? Hypertension   ? ? ?No past surgical history on file. ? ?Social History  ? ?Socioeconomic History  ? Marital status: Married  ?  Spouse name: Not on file  ? Number of children: 5  ? Years of education: Not on file  ? Highest education level: Not on file  ?Occupational History  ? Occupation: Journalist, newspaper  ?  Employer: [employer]  ?  Comment: coaches basketball at [employer]  ?Tobacco Use  ? Smoking status: Never  ? Smokeless tobacco: Never  ?Vaping Use  ? Vaping Use: Never used  ?Substance and Sexual Activity  ? Alcohol use: Yes  ?  Alcohol/week: 2.0 standard drinks  ?  Types: 2 Cans of beer per week  ?  Comment: on occasion, not in excess  ? Drug use: No  ? Sexual activity: Yes  ?Other Topics Concern  ? Not on file  ?Social History Narrative  ? Regular exercise-yes  ? ?Social Determinants of Health  ? ?Financial Resource Strain: Not on file  ?Food Insecurity: Not on file  ?Transportation Needs: Not on file  ?Physical Activity: Not on file  ?Stress: Not on file  ?Social Connections: Not on file  ?Intimate Partner  Violence: Not on file  ? ? ?Current Outpatient Medications on File Prior to Visit  ?Medication Sig Dispense Refill  ? Accu-Chek Softclix Lancets lancets Use to check blood sugar twice a day 200 each 3  ? aspirin EC 81 MG tablet Take 1 tablet (81 mg total) by mouth daily. 90 tablet 1  ? Cholecalciferol 1.25 MG (50000 UT) capsule Take 1 capsule (50,000 Units total) by mouth once a week. 12 capsule 0  ? Continuous Blood Gluc Sensor (FREESTYLE LIBRE 2 SENSOR) MISC Use to check blood sugar and change every 14 days 6 each 3  ? COVID-19 mRNA bivalent vaccine, Pfizer, (PFIZER COVID-19 VAC BIVALENT) injection Inject into the muscle. 0.3 mL 0  ? fenofibrate (TRICOR) 145 MG tablet Take 1 tablet (145 mg total) by mouth daily. 90 tablet 0  ? glucose blood (ACCU-CHEK GUIDE) test strip Use to check blood sugar twice a day 200 each 3  ? insulin lispro (HUMALOG KWIKPEN) 100 UNIT/ML KwikPen Inject 10 Units into the skin daily with breakfast. 15 mL 11  ? Insulin Pen Needle (TECHLITE PEN NEEDLES) 32G X 6 MM MISC Use as directed to inject twice a day. 100 each 3  ? metoprolol succinate (TOPROL-XL) 25 MG 24 hr tablet Take one-half tablet (12.5 mg total) by mouth daily. Take with or immediately following a meal. 45 tablet 3  ?  rosuvastatin (CRESTOR) 20 MG tablet Take 1 tablet (20 mg total) by mouth daily. 90 tablet 0  ? icosapent Ethyl (VASCEPA) 1 g capsule TAKE 2 CAPSULES (2 G TOTAL) BY MOUTH 2 (TWO) TIMES DAILY. 360 capsule 1  ? olmesartan (BENICAR) 40 MG tablet TAKE 1 TABLET (40 MG TOTAL) BY MOUTH DAILY. 90 tablet 3  ? ?No current facility-administered medications on file prior to visit.  ? ? ?Allergies  ?Allergen Reactions  ? Metformin And Related Diarrhea  ? Ramipril   ?  cough  ? ? ?Family History  ?Problem Relation Age of Onset  ? Heart disease Father   ? Heart attack Father 53  ? Heart disease Brother   ? Heart attack Brother 46  ? Diabetes Mother   ? Heart attack Mother   ? Breast cancer Cousin   ? Heart attack Paternal Uncle   ?  Heart attack Paternal Uncle   ? Stroke Paternal Uncle   ? Heart attack Paternal Uncle 109  ?     5 HA in the past   ? Colon cancer Neg Hx   ? Stomach cancer Neg Hx   ? Esophageal cancer Neg Hx   ? ? ?BP (!) 148/92 (BP Location: Left Arm, Patient Position: Sitting, Cuff Size: Normal)   Pulse 78   Ht '5\' 8"'$  (1.727 m)   Wt 204 lb (92.5 kg)   SpO2 98%   BMI 31.02 kg/m?  ? ? ?Review of Systems ?He denies hypoglycemia.  ?   ?Objective:  ? Physical Exam ?VITAL SIGNS:  See vs page ?GENERAL: no distress ? ? ? ?Lab Results  ?Component Value Date  ? HGBA1C 11.2 (A) 02/05/2022  ? ?   ?Assessment & Plan:  ?Insulin-requiring type 2 DM: uncontrolled ? ?Patient Instructions  ?On this type of insulin schedule, you should eat meals on a regular schedule.  If a meal is missed or significantly delayed, your blood sugar could go low.   ?check your blood sugar twice a day.  vary the time of day when you check, between before the 3 meals, and at bedtime.  also check if you have symptoms of your blood sugar being too high or too low.  please keep a record of the readings and bring it to your next appointment here (or you can bring the meter itself).  You can write it on any piece of paper.  please call us sooner if your blood sugar goes below 70, or if you have a lot of readings over 200.    ?Please take 80 units of Toujeo, and 10 units of Novolog, both with breakfast.   ?You should have an endocrinology follow-up appointment in 2 months.   ? ? ?

## 2022-02-12 ENCOUNTER — Other Ambulatory Visit (HOSPITAL_BASED_OUTPATIENT_CLINIC_OR_DEPARTMENT_OTHER): Payer: Self-pay

## 2022-03-03 ENCOUNTER — Other Ambulatory Visit: Payer: Self-pay | Admitting: Internal Medicine

## 2022-03-03 ENCOUNTER — Other Ambulatory Visit: Payer: Self-pay | Admitting: Student

## 2022-03-03 ENCOUNTER — Other Ambulatory Visit (HOSPITAL_BASED_OUTPATIENT_CLINIC_OR_DEPARTMENT_OTHER): Payer: Self-pay

## 2022-03-03 DIAGNOSIS — E781 Pure hyperglyceridemia: Secondary | ICD-10-CM

## 2022-03-03 DIAGNOSIS — Z794 Long term (current) use of insulin: Secondary | ICD-10-CM

## 2022-03-03 MED ORDER — METOPROLOL SUCCINATE ER 25 MG PO TB24
12.5000 mg | ORAL_TABLET | Freq: Every day | ORAL | 3 refills | Status: DC
  Filled 2022-03-03: qty 15, 30d supply, fill #0
  Filled 2022-05-11: qty 15, 30d supply, fill #1
  Filled 2022-07-20: qty 15, 30d supply, fill #2
  Filled 2022-09-30: qty 15, 30d supply, fill #3
  Filled 2023-01-06: qty 15, 30d supply, fill #4

## 2022-03-04 ENCOUNTER — Other Ambulatory Visit: Payer: Self-pay

## 2022-03-04 ENCOUNTER — Other Ambulatory Visit (HOSPITAL_BASED_OUTPATIENT_CLINIC_OR_DEPARTMENT_OTHER): Payer: Self-pay

## 2022-03-04 DIAGNOSIS — Z794 Long term (current) use of insulin: Secondary | ICD-10-CM

## 2022-03-04 MED ORDER — INSULIN LISPRO (1 UNIT DIAL) 100 UNIT/ML (KWIKPEN)
10.0000 [IU] | PEN_INJECTOR | Freq: Every day | SUBCUTANEOUS | 2 refills | Status: DC
  Filled 2022-03-04: qty 3, 30d supply, fill #0
  Filled 2022-04-08: qty 3, 30d supply, fill #1

## 2022-03-05 ENCOUNTER — Other Ambulatory Visit (HOSPITAL_BASED_OUTPATIENT_CLINIC_OR_DEPARTMENT_OTHER): Payer: Self-pay

## 2022-03-31 ENCOUNTER — Encounter: Payer: Self-pay | Admitting: Gastroenterology

## 2022-04-08 ENCOUNTER — Other Ambulatory Visit (HOSPITAL_BASED_OUTPATIENT_CLINIC_OR_DEPARTMENT_OTHER): Payer: Self-pay

## 2022-04-09 ENCOUNTER — Other Ambulatory Visit (HOSPITAL_BASED_OUTPATIENT_CLINIC_OR_DEPARTMENT_OTHER): Payer: Self-pay

## 2022-04-09 MED ORDER — ALBUTEROL SULFATE HFA 108 (90 BASE) MCG/ACT IN AERS
INHALATION_SPRAY | RESPIRATORY_TRACT | 0 refills | Status: DC
Start: 2022-04-09 — End: 2022-05-04
  Filled 2022-04-09: qty 6.7, 25d supply, fill #0

## 2022-04-09 MED ORDER — PHENAZOPYRIDINE HCL 100 MG PO TABS
ORAL_TABLET | ORAL | 0 refills | Status: DC
  Filled 2022-04-09: qty 10, 3d supply, fill #0

## 2022-04-13 ENCOUNTER — Other Ambulatory Visit (HOSPITAL_BASED_OUTPATIENT_CLINIC_OR_DEPARTMENT_OTHER): Payer: Self-pay

## 2022-04-14 ENCOUNTER — Other Ambulatory Visit (HOSPITAL_BASED_OUTPATIENT_CLINIC_OR_DEPARTMENT_OTHER): Payer: Self-pay

## 2022-04-15 ENCOUNTER — Other Ambulatory Visit (HOSPITAL_BASED_OUTPATIENT_CLINIC_OR_DEPARTMENT_OTHER): Payer: Self-pay

## 2022-04-16 ENCOUNTER — Other Ambulatory Visit (HOSPITAL_BASED_OUTPATIENT_CLINIC_OR_DEPARTMENT_OTHER): Payer: Self-pay

## 2022-05-03 ENCOUNTER — Other Ambulatory Visit: Payer: Self-pay | Admitting: Endocrinology

## 2022-05-03 DIAGNOSIS — Z794 Long term (current) use of insulin: Secondary | ICD-10-CM

## 2022-05-04 ENCOUNTER — Other Ambulatory Visit: Payer: No Typology Code available for payment source

## 2022-05-04 ENCOUNTER — Encounter: Payer: Self-pay | Admitting: Internal Medicine

## 2022-05-04 ENCOUNTER — Other Ambulatory Visit (HOSPITAL_BASED_OUTPATIENT_CLINIC_OR_DEPARTMENT_OTHER): Payer: Self-pay

## 2022-05-04 ENCOUNTER — Ambulatory Visit: Payer: Managed Care, Other (non HMO) | Admitting: Internal Medicine

## 2022-05-04 VITALS — BP 138/84 | HR 70 | Temp 97.8°F | Resp 16 | Ht 68.0 in | Wt 198.0 lb

## 2022-05-04 DIAGNOSIS — E781 Pure hyperglyceridemia: Secondary | ICD-10-CM

## 2022-05-04 DIAGNOSIS — R800 Isolated proteinuria: Secondary | ICD-10-CM | POA: Diagnosis not present

## 2022-05-04 DIAGNOSIS — K635 Polyp of colon: Secondary | ICD-10-CM

## 2022-05-04 DIAGNOSIS — I10 Essential (primary) hypertension: Secondary | ICD-10-CM | POA: Diagnosis not present

## 2022-05-04 DIAGNOSIS — Z23 Encounter for immunization: Secondary | ICD-10-CM

## 2022-05-04 DIAGNOSIS — Z0000 Encounter for general adult medical examination without abnormal findings: Secondary | ICD-10-CM

## 2022-05-04 DIAGNOSIS — E559 Vitamin D deficiency, unspecified: Secondary | ICD-10-CM | POA: Diagnosis not present

## 2022-05-04 DIAGNOSIS — E785 Hyperlipidemia, unspecified: Secondary | ICD-10-CM

## 2022-05-04 DIAGNOSIS — E139 Other specified diabetes mellitus without complications: Secondary | ICD-10-CM | POA: Diagnosis not present

## 2022-05-04 LAB — LIPID PANEL
Cholesterol: 297 mg/dL — ABNORMAL HIGH (ref 0–200)
HDL: 32.5 mg/dL — ABNORMAL LOW (ref >39.00)
Total CHOL/HDL Ratio: 9
Triglycerides: 1780 mg/dL — ABNORMAL HIGH (ref 0.0–149.0)

## 2022-05-04 LAB — HEPATIC FUNCTION PANEL
ALT: 14 U/L (ref 0–53)
AST: 14 U/L (ref 0–37)
Albumin: 4.4 g/dL (ref 3.5–5.2)
Alkaline Phosphatase: 40 U/L (ref 39–117)
Bilirubin, Direct: 0.1 mg/dL (ref 0.0–0.3)
Total Bilirubin: 0.4 mg/dL (ref 0.2–1.2)
Total Protein: 7.7 g/dL (ref 6.0–8.3)

## 2022-05-04 LAB — URINALYSIS, ROUTINE W REFLEX MICROSCOPIC
Bilirubin Urine: NEGATIVE
Ketones, ur: NEGATIVE
Leukocytes,Ua: NEGATIVE
Nitrite: NEGATIVE
RBC / HPF: NONE SEEN (ref 0–?)
Specific Gravity, Urine: 1.025 (ref 1.000–1.030)
Total Protein, Urine: 30 — AB
Urine Glucose: >=1000 — AB
Urobilinogen, UA: 0.2 (ref 0.0–1.0)
pH: 5.5 (ref 5.0–8.0)

## 2022-05-04 LAB — CBC WITH DIFFERENTIAL/PLATELET
Basophils Absolute: 0 10*3/uL (ref 0.0–0.1)
Basophils Relative: 0.7 % (ref 0.0–3.0)
Eosinophils Absolute: 0.2 10*3/uL (ref 0.0–0.7)
Eosinophils Relative: 3.4 % (ref 0.0–5.0)
HCT: 41.2 % (ref 39.0–52.0)
Hemoglobin: 13.6 g/dL (ref 13.0–17.0)
Lymphocytes Relative: 51.2 % — ABNORMAL HIGH (ref 12.0–46.0)
Lymphs Abs: 3.3 10*3/uL (ref 0.7–4.0)
MCHC: 33.1 g/dL (ref 30.0–36.0)
MCV: 80.6 fl (ref 78.0–100.0)
Monocytes Absolute: 0.4 10*3/uL (ref 0.1–1.0)
Monocytes Relative: 6.9 % (ref 3.0–12.0)
Neutro Abs: 2.4 10*3/uL (ref 1.4–7.7)
Neutrophils Relative %: 37.8 % — ABNORMAL LOW (ref 43.0–77.0)
Platelets: 260 10*3/uL (ref 150.0–400.0)
RBC: 5.11 Mil/uL (ref 4.22–5.81)
RDW: 13.8 % (ref 11.5–15.5)
WBC: 6.5 10*3/uL (ref 4.0–10.5)

## 2022-05-04 LAB — BASIC METABOLIC PANEL
BUN: 13 mg/dL (ref 6–23)
CO2: 26 mEq/L (ref 19–32)
Calcium: 9.5 mg/dL (ref 8.4–10.5)
Chloride: 99 mEq/L (ref 96–112)
Creatinine, Ser: 0.96 mg/dL (ref 0.40–1.50)
GFR: 88.45 mL/min (ref >60.00)
Glucose, Bld: 258 mg/dL — ABNORMAL HIGH (ref 70–99)
Potassium: 3.7 mEq/L (ref 3.5–5.1)
Sodium: 134 mEq/L — ABNORMAL LOW (ref 135–145)

## 2022-05-04 LAB — PSA: PSA: 2.27 ng/mL (ref 0.10–4.00)

## 2022-05-04 LAB — MICROALBUMIN / CREATININE URINE RATIO
Creatinine,U: 138.7 mg/dL
Microalb Creat Ratio: 17.5 mg/g (ref 0.0–30.0)
Microalb, Ur: 24.3 mg/dL — ABNORMAL HIGH (ref 0.0–1.9)

## 2022-05-04 LAB — LDL CHOLESTEROL, DIRECT: Direct LDL: 54 mg/dL

## 2022-05-04 LAB — TSH: TSH: 1.88 u[IU]/mL (ref 0.35–5.50)

## 2022-05-04 LAB — HEMOGLOBIN A1C: Hgb A1c MFr Bld: 12.6 % — ABNORMAL HIGH (ref 4.6–6.5)

## 2022-05-04 LAB — VITAMIN D 25 HYDROXY (VIT D DEFICIENCY, FRACTURES): VITD: 10.92 ng/mL — ABNORMAL LOW (ref 30.00–100.00)

## 2022-05-04 MED ORDER — INSULIN LISPRO (1 UNIT DIAL) 100 UNIT/ML (KWIKPEN)
10.0000 [IU] | PEN_INJECTOR | Freq: Three times a day (TID) | SUBCUTANEOUS | 1 refills | Status: DC
  Filled 2022-05-04: qty 9, 30d supply, fill #0

## 2022-05-04 MED ORDER — ICOSAPENT ETHYL 1 G PO CAPS
ORAL_CAPSULE | Freq: Two times a day (BID) | ORAL | 1 refills | Status: DC
  Filled 2022-05-04: qty 120, 30d supply, fill #0
  Filled 2022-09-06: qty 120, 30d supply, fill #1

## 2022-05-04 MED ORDER — INSULIN GLARGINE (2 UNIT DIAL) 300 UNIT/ML ~~LOC~~ SOPN
100.0000 [IU] | PEN_INJECTOR | SUBCUTANEOUS | 1 refills | Status: DC
  Filled 2022-05-04: qty 9, 27d supply, fill #0

## 2022-05-04 MED ORDER — TECHLITE PEN NEEDLES 32G X 6 MM MISC
1.0000 | Freq: Four times a day (QID) | 1 refills | Status: DC
  Filled 2022-05-04: qty 200, 75d supply, fill #0
  Filled 2022-05-04: qty 100, 25d supply, fill #0

## 2022-05-04 MED ORDER — FENOFIBRATE 145 MG PO TABS
145.0000 mg | ORAL_TABLET | Freq: Every day | ORAL | 0 refills | Status: DC
  Filled 2022-05-04: qty 30, 30d supply, fill #0
  Filled 2022-07-20: qty 30, 30d supply, fill #1
  Filled 2022-09-30: qty 30, 30d supply, fill #2

## 2022-05-04 MED ORDER — CHOLECALCIFEROL 1.25 MG (50000 UT) PO CAPS
50000.0000 [IU] | ORAL_CAPSULE | ORAL | 0 refills | Status: AC
  Filled 2022-05-04: qty 4, 28d supply, fill #0

## 2022-05-04 MED ORDER — OLMESARTAN MEDOXOMIL 20 MG PO TABS
20.0000 mg | ORAL_TABLET | Freq: Every day | ORAL | 0 refills | Status: DC
  Filled 2022-05-04: qty 30, 30d supply, fill #0
  Filled 2022-07-07: qty 30, 30d supply, fill #1
  Filled 2022-09-06: qty 30, 30d supply, fill #2

## 2022-05-04 NOTE — Patient Instructions (Signed)
Health Maintenance, Male Adopting a healthy lifestyle and getting preventive care are important in promoting health and wellness. Ask your health care provider about: The right schedule for you to have regular tests and exams. Things you can do on your own to prevent diseases and keep yourself healthy. What should I know about diet, weight, and exercise? Eat a healthy diet  Eat a diet that includes plenty of vegetables, fruits, low-fat dairy products, and lean protein. Do not eat a lot of foods that are high in solid fats, added sugars, or sodium. Maintain a healthy weight Body mass index (BMI) is a measurement that can be used to identify possible weight problems. It estimates body fat based on height and weight. Your health care provider can help determine your BMI and help you achieve or maintain a healthy weight. Get regular exercise Get regular exercise. This is one of the most important things you can do for your health. Most adults should: Exercise for at least 150 minutes each week. The exercise should increase your heart rate and make you sweat (moderate-intensity exercise). Do strengthening exercises at least twice a week. This is in addition to the moderate-intensity exercise. Spend less time sitting. Even light physical activity can be beneficial. Watch cholesterol and blood lipids Have your blood tested for lipids and cholesterol at 56 years of age, then have this test every 5 years. You may need to have your cholesterol levels checked more often if: Your lipid or cholesterol levels are high. You are older than 56 years of age. You are at high risk for heart disease. What should I know about cancer screening? Many types of cancers can be detected early and may often be prevented. Depending on your health history and family history, you may need to have cancer screening at various ages. This may include screening for: Colorectal cancer. Prostate cancer. Skin cancer. Lung  cancer. What should I know about heart disease, diabetes, and high blood pressure? Blood pressure and heart disease High blood pressure causes heart disease and increases the risk of stroke. This is more likely to develop in people who have high blood pressure readings or are overweight. Talk with your health care provider about your target blood pressure readings. Have your blood pressure checked: Every 3-5 years if you are 18-39 years of age. Every year if you are 40 years old or older. If you are between the ages of 65 and 75 and are a current or former smoker, ask your health care provider if you should have a one-time screening for abdominal aortic aneurysm (AAA). Diabetes Have regular diabetes screenings. This checks your fasting blood sugar level. Have the screening done: Once every three years after age 45 if you are at a normal weight and have a low risk for diabetes. More often and at a younger age if you are overweight or have a high risk for diabetes. What should I know about preventing infection? Hepatitis B If you have a higher risk for hepatitis B, you should be screened for this virus. Talk with your health care provider to find out if you are at risk for hepatitis B infection. Hepatitis C Blood testing is recommended for: Everyone born from 1945 through 1965. Anyone with known risk factors for hepatitis C. Sexually transmitted infections (STIs) You should be screened each year for STIs, including gonorrhea and chlamydia, if: You are sexually active and are younger than 56 years of age. You are older than 56 years of age and your   health care provider tells you that you are at risk for this type of infection. Your sexual activity has changed since you were last screened, and you are at increased risk for chlamydia or gonorrhea. Ask your health care provider if you are at risk. Ask your health care provider about whether you are at high risk for HIV. Your health care provider  may recommend a prescription medicine to help prevent HIV infection. If you choose to take medicine to prevent HIV, you should first get tested for HIV. You should then be tested every 3 months for as long as you are taking the medicine. Follow these instructions at home: Alcohol use Do not drink alcohol if your health care provider tells you not to drink. If you drink alcohol: Limit how much you have to 0-2 drinks a day. Know how much alcohol is in your drink. In the [place] one drink equals one 12 oz bottle of beer (355 mL), one 5 oz glass of wine (148 mL), or one 1 oz glass of hard liquor (44 mL). Lifestyle Do not use any products that contain nicotine or tobacco. These products include cigarettes, chewing tobacco, and vaping devices, such as e-cigarettes. If you need help quitting, ask your health care provider. Do not use street drugs. Do not share needles. Ask your health care provider for help if you need support or information about quitting drugs. General instructions Schedule regular health, dental, and eye exams. Stay current with your vaccines. Tell your health care provider if: You often feel depressed. You have ever been abused or do not feel safe at home. Summary Adopting a healthy lifestyle and getting preventive care are important in promoting health and wellness. Follow your health care provider's instructions about healthy diet, exercising, and getting tested or screened for diseases. Follow your health care provider's instructions on monitoring your cholesterol and blood pressure. This information is not intended to replace advice given to you by your health care provider. Make sure you discuss any questions you have with your health care provider. Document Revised: 02/09/2021 Document Reviewed: 02/09/2021 Elsevier Patient Education  2023 Elsevier Inc.  

## 2022-05-04 NOTE — Progress Notes (Signed)
Subjective:  Patient ID: Tim Wilson, male    DOB: 1965/10/26  Age: 56 y.o. MRN: [id number]  CC: Annual Exam, Hypertension, Hyperlipidemia, and Diabetes   HPI Tim Wilson presents for a CPX and f/up -  He indicates that he is not taking Benicar or using Toujeo.  He is not sure if he ran out or someone told him to stop using them.  He is active and denies chest pain, shortness of breath, diaphoresis, dizziness, lightheadedness, or edema.  Outpatient Medications Prior to Visit  Medication Sig Dispense Refill   Accu-Chek Softclix Lancets lancets Use to check blood sugar twice a day 200 each 3   albuterol (VENTOLIN HFA) 108 (90 Base) MCG/ACT inhaler Inhale into the lungs.     aspirin EC 325 MG tablet Take by mouth.     Continuous Blood Gluc Sensor (FREESTYLE LIBRE 2 SENSOR) MISC Use to check blood sugar and change every 14 days 6 each 3   glucose blood (ACCU-CHEK GUIDE) test strip Use to check blood sugar twice a day 200 each 3   metoprolol succinate (TOPROL-XL) 25 MG 24 hr tablet Take one-half tablet (12.5 mg total) by mouth daily. Take with or immediately following a meal. 45 tablet 3   rosuvastatin (CRESTOR) 20 MG tablet Take 1 tablet (20 mg total) by mouth daily. 90 tablet 0   aspirin EC 81 MG tablet Take 1 tablet (81 mg total) by mouth daily. 90 tablet 1   Cholecalciferol 1.25 MG (50000 UT) capsule Take 1 capsule (50,000 Units total) by mouth once a week. 12 capsule 0   COVID-19 mRNA bivalent vaccine, Pfizer, (PFIZER COVID-19 VAC BIVALENT) injection Inject into the muscle. 0.3 mL 0   fenofibrate (TRICOR) 145 MG tablet Take 1 tablet (145 mg total) by mouth daily. 90 tablet 0   insulin glargine, 2 Unit Dial, (TOUJEO MAX) 300 UNIT/ML Solostar Pen Inject 80 Units into the skin every morning. 30 mL 1   insulin lispro (HUMALOG KWIKPEN) 100 UNIT/ML KwikPen Inject 10 Units into the skin daily with breakfast. 15 mL 2   Insulin Pen Needle (TECHLITE PEN NEEDLES) 32G X 6 MM MISC Use as  directed to inject twice a day. 100 each 3   phenazopyridine (PYRIDIUM) 100 MG tablet Take 1 tablet (100 mg total) by mouth 3 (three) times daily as needed for Pain. 10 tablet 0   albuterol (VENTOLIN HFA) 108 (90 Base) MCG/ACT inhaler Inhale 2 puffs into the lungs every 6 (six) hours as needed for up to 7 days for wheezing. 6.7 g 0   icosapent Ethyl (VASCEPA) 1 g capsule TAKE 2 CAPSULES (2 G TOTAL) BY MOUTH 2 (TWO) TIMES DAILY. 360 capsule 1   olmesartan (BENICAR) 40 MG tablet TAKE 1 TABLET (40 MG TOTAL) BY MOUTH DAILY. 90 tablet 3   No facility-administered medications prior to visit.    ROS Review of Systems  Constitutional:  Negative for chills, diaphoresis, fatigue and fever.  HENT: Negative.    Eyes: Negative.   Respiratory: Negative.  Negative for cough, chest tightness, shortness of breath and wheezing.   Cardiovascular:  Negative for chest pain, palpitations and leg swelling.  Gastrointestinal:  Negative for abdominal pain, diarrhea, nausea and vomiting.  Endocrine: Negative.   Genitourinary: Negative.  Negative for difficulty urinating.  Musculoskeletal:  Negative for arthralgias and myalgias.  Skin: Negative.  Negative for rash.  Neurological:  Negative for dizziness and weakness.  Hematological:  Negative for adenopathy. Does not bruise/bleed easily.  Psychiatric/Behavioral:  Negative.      Objective:  BP 138/84 (BP Location: Right Arm, Patient Position: Sitting, Cuff Size: Large)   Pulse 70   Temp 97.8 F (36.6 C) (Oral)   Resp 16   Ht '5\' 8"'$  (1.727 m)   Wt 198 lb (89.8 kg)   SpO2 94%   BMI 30.11 kg/m   BP Readings from Last 3 Encounters:  05/04/22 138/84  02/05/22 (!) 148/92  12/21/21 (!) 142/85    Wt Readings from Last 3 Encounters:  05/04/22 198 lb (89.8 kg)  02/05/22 204 lb (92.5 kg)  12/21/21 207 lb 12.8 oz (94.3 kg)    Physical Exam Vitals reviewed.  HENT:     Nose: Nose normal.     Mouth/Throat:     Mouth: Mucous membranes are moist.  Eyes:      General: No scleral icterus.    Conjunctiva/sclera: Conjunctivae normal.  Cardiovascular:     Rate and Rhythm: Normal rate and regular rhythm.     Heart sounds: No murmur heard. Pulmonary:     Effort: Pulmonary effort is normal.     Breath sounds: No stridor. No wheezing, rhonchi or rales.  Abdominal:     General: Abdomen is flat.     Palpations: There is no mass.     Tenderness: There is no abdominal tenderness. There is no guarding.     Hernia: No hernia is present. There is no hernia in the left inguinal area or right inguinal area.  Genitourinary:    Pubic Area: No rash.      Penis: Normal and uncircumcised.      Testes: Normal.     Epididymis:     Right: Normal.     Left: Normal.     Prostate: Normal. Not enlarged, not tender and no nodules present.     Rectum: Normal. Guaiac result negative. No mass, tenderness, anal fissure, external hemorrhoid or internal hemorrhoid. Normal anal tone.  Musculoskeletal:        General: Normal range of motion.     Cervical back: Neck supple.     Right lower leg: No edema.     Left lower leg: No edema.  Lymphadenopathy:     Cervical: No cervical adenopathy.     Lower Body: No right inguinal adenopathy. No left inguinal adenopathy.  Skin:    General: Skin is warm and dry.  Neurological:     General: No focal deficit present.     Mental Status: He is alert.  Psychiatric:        Mood and Affect: Mood normal.        Behavior: Behavior normal.     Lab Results  Component Value Date   WBC 6.5 05/04/2022   HGB 13.6 05/04/2022   HCT 41.2 05/04/2022   PLT 260.0 05/04/2022   GLUCOSE 258 (H) 05/04/2022   CHOL 297 (H) 05/04/2022   TRIG (H) 05/04/2022    1780.0 Triglyceride is over 400; calculations on Lipids are invalid.   HDL 32.50 (L) 05/04/2022   LDLDIRECT 54.0 05/04/2022   LDLCALC Comment (A) 12/28/2021   ALT 14 05/04/2022   AST 14 05/04/2022   NA 134 (L) 05/04/2022   K 3.7 05/04/2022   CL 99 05/04/2022   CREATININE 0.96  05/04/2022   BUN 13 05/04/2022   CO2 26 05/04/2022   TSH 1.88 05/04/2022   PSA 2.27 05/04/2022   HGBA1C 12.6 (H) 05/04/2022   MICROALBUR 24.3 (H) 05/04/2022    CT CARDIAC SCORING ([facility]  LOCATIONS ONLY)  Result Date: 06/12/2021 CLINICAL DATA:  56 year old African American male with high cholesterol. EXAM: CT CARDIAC CORONARY ARTERY CALCIUM SCORE TECHNIQUE: Non-contrast imaging through the heart was performed using prospective ECG gating. Image post processing was performed on an independent workstation, allowing for quantitative analysis of the heart and coronary arteries. Note that this exam targets the heart and the chest was not imaged in its entirety. COMPARISON:  None. FINDINGS: Technical quality: Good CORONARY CALCIUM SCORES: Left Main: No coronary artery calcification LAD: 276 LCx: 53 RCA: 3 CORONARY CALCIUM Total Agatston Score: 332 MESA database percentile: 97 Ascending aorta (normal <  40 mm): 32 mm EXTRACARDIAC FINDINGS: Limited view of the lung parenchyma demonstrates small focus of branching airspace disease in the RIGHT upper lobe (image 2/series 9). Airways are normal. Limited view of the mediastinum demonstrates no adenopathy. Esophagus normal. Limited view of the upper abdomen is unremarkable. Limited view of the skeleton and chest wall is unremarkable. IMPRESSION: 1. Three-vessel coronary artery calcification. 2. Total Agatston Score: 332 3. MESA age and sex matched database percentile: 20. 4. Small focus of airspace disease in the RIGHT upper lobe suggests subtle pulmonary infection. These results will be called to the ordering clinician or representative by the Radiologist Assistant, and communication documented in the PACS or [employer]. Electronically Signed   By: Suzy Bouchard M.D.   On: 06/12/2021 08:02    Assessment & Plan:   Tim Wilson was seen today for annual exam, hypertension, hyperlipidemia and diabetes.  Diagnoses and all orders for this visit:  Essential  hypertension- He has not achieved his blood pressure goal of 130/80.  Will restart the ARB. -     Basic metabolic panel; Future -     TSH; Future -     Urinalysis, Routine w reflex microscopic; Future -     CBC with Differential/Platelet; Future -     CBC with Differential/Platelet -     Urinalysis, Routine w reflex microscopic -     TSH -     Basic metabolic panel -     olmesartan (BENICAR) 20 MG tablet; Take 1 tablet (20 mg total) by mouth daily.  Diabetes mellitus type 1.5, managed as type 1 (Tim Wilson)- His A1c remains too high.  I have asked him to restart the basal insulin.  Will increase the dose of the mealtime insulin. -     Basic metabolic panel; Future -     Hemoglobin A1c; Future -     Microalbumin / creatinine urine ratio; Future -     Microalbumin / creatinine urine ratio -     Hemoglobin A1c -     Basic metabolic panel -     insulin glargine, 2 Unit Dial, (TOUJEO MAX) 300 UNIT/ML Solostar Pen; Inject 100 Units into the skin every morning. -     insulin lispro (HUMALOG KWIKPEN) 100 UNIT/ML KwikPen; Inject 10 Units into the skin 3 (three) times daily with meals. -     Insulin Pen Needle (TECHLITE PEN NEEDLES) 32G X 6 MM MISC; Inject 1 Act into the skin 4 (four) times daily.  Routine general medical examination at a health care facility- Exam completed, labs reviewed, vaccines reviewed and updated, cancer screenings addressed, patient education was given. -     PSA; Future -     PSA  Hyperlipidemia with target LDL less than 100- LDL goal achieved. Doing well on the statin  -     Lipid panel; Future -  TSH; Future -     TSH -     Lipid panel  Isolated proteinuria without specific morphologic lesion- Will restart the ARB. -     Hepatic function panel; Future -     Microalbumin / creatinine urine ratio; Future -     Microalbumin / creatinine urine ratio -     Hepatic function panel -     olmesartan (BENICAR) 20 MG tablet; Take 1 tablet (20 mg total) by mouth  daily.  HYPERTRIGLYCERIDEMIA- Will restart icosapent ethyl.  Will increase the dose of insulin.  Will continue fenofibrate. -     Lipid panel; Future -     icosapent Ethyl (VASCEPA) 1 g capsule; TAKE 2 CAPSULES (2 G TOTAL) BY MOUTH 2 (TWO) TIMES DAILY. -     Lipid panel -     insulin glargine, 2 Unit Dial, (TOUJEO MAX) 300 UNIT/ML Solostar Pen; Inject 100 Units into the skin every morning. -     fenofibrate (TRICOR) 145 MG tablet; Take 1 tablet (145 mg total) by mouth daily. -     insulin lispro (HUMALOG KWIKPEN) 100 UNIT/ML KwikPen; Inject 10 Units into the skin 3 (three) times daily with meals.  Vitamin D deficiency disease -     VITAMIN D 25 Hydroxy (Vit-D Deficiency, Fractures); Future -     VITAMIN D 25 Hydroxy (Vit-D Deficiency, Fractures) -     Cholecalciferol 1.25 MG (50000 UT) capsule; Take 1 capsule (50,000 Units total) by mouth once a week.  Other orders -     Zoster Recombinant (Shingrix ) -     LDL cholesterol, direct   I have discontinued Antionette Fairy. Kossman's olmesartan, Pfizer COVID-19 Vac Bivalent, and phenazopyridine. I have also changed his insulin glargine (2 Unit Dial), insulin lispro, and TechLite Pen Needles. Additionally, I am having him start on olmesartan. Lastly, I am having him maintain his Accu-Chek Guide, Accu-Chek Softclix Lancets, rosuvastatin, FreeStyle Libre 2 Sensor, metoprolol succinate, albuterol, aspirin EC, icosapent Ethyl, Cholecalciferol, and fenofibrate.  Meds ordered this encounter  Medications   icosapent Ethyl (VASCEPA) 1 g capsule    Sig: TAKE 2 CAPSULES (2 G TOTAL) BY MOUTH 2 (TWO) TIMES DAILY.    Dispense:  360 capsule    Refill:  1   Cholecalciferol 1.25 MG (50000 UT) capsule    Sig: Take 1 capsule (50,000 Units total) by mouth once a week.    Dispense:  12 capsule    Refill:  0   insulin glargine, 2 Unit Dial, (TOUJEO MAX) 300 UNIT/ML Solostar Pen    Sig: Inject 100 Units into the skin every morning.    Dispense:  30 mL    Refill:  1     And pen needles 2/day   fenofibrate (TRICOR) 145 MG tablet    Sig: Take 1 tablet (145 mg total) by mouth daily.    Dispense:  90 tablet    Refill:  0   insulin lispro (HUMALOG KWIKPEN) 100 UNIT/ML KwikPen    Sig: Inject 10 Units into the skin 3 (three) times daily with meals.    Dispense:  27 mL    Refill:  1   Insulin Pen Needle (TECHLITE PEN NEEDLES) 32G X 6 MM MISC    Sig: Inject into the skin 4 (four) times daily.    Dispense:  300 each    Refill:  1   olmesartan (BENICAR) 20 MG tablet    Sig: Take 1 tablet (20 mg total) by mouth daily.  Dispense:  90 tablet    Refill:  0     Follow-up: Return in about 6 months (around 11/04/2022).  Scarlette Calico, MD

## 2022-05-05 ENCOUNTER — Other Ambulatory Visit (HOSPITAL_BASED_OUTPATIENT_CLINIC_OR_DEPARTMENT_OTHER): Payer: Self-pay

## 2022-05-07 ENCOUNTER — Ambulatory Visit (INDEPENDENT_AMBULATORY_CARE_PROVIDER_SITE_OTHER): Payer: Managed Care, Other (non HMO) | Admitting: Endocrinology

## 2022-05-07 ENCOUNTER — Encounter: Payer: Self-pay | Admitting: Endocrinology

## 2022-05-07 ENCOUNTER — Other Ambulatory Visit (HOSPITAL_BASED_OUTPATIENT_CLINIC_OR_DEPARTMENT_OTHER): Payer: Self-pay

## 2022-05-07 VITALS — BP 124/84 | HR 58 | Ht 68.0 in | Wt 196.2 lb

## 2022-05-07 DIAGNOSIS — K635 Polyp of colon: Secondary | ICD-10-CM | POA: Insufficient documentation

## 2022-05-07 DIAGNOSIS — E1165 Type 2 diabetes mellitus with hyperglycemia: Secondary | ICD-10-CM

## 2022-05-07 DIAGNOSIS — E782 Mixed hyperlipidemia: Secondary | ICD-10-CM | POA: Diagnosis not present

## 2022-05-07 DIAGNOSIS — Z794 Long term (current) use of insulin: Secondary | ICD-10-CM | POA: Diagnosis not present

## 2022-05-07 MED ORDER — DAPAGLIFLOZIN PROPANEDIOL 5 MG PO TABS
5.0000 mg | ORAL_TABLET | Freq: Every day | ORAL | 3 refills | Status: DC
  Filled 2022-05-07: qty 30, 30d supply, fill #0
  Filled 2022-07-07: qty 30, 30d supply, fill #1

## 2022-05-07 MED ORDER — PIOGLITAZONE HCL 15 MG PO TABS
15.0000 mg | ORAL_TABLET | Freq: Every day | ORAL | 2 refills | Status: DC
  Filled 2022-05-07: qty 30, 30d supply, fill #0
  Filled 2022-07-07: qty 30, 30d supply, fill #1
  Filled 2022-09-06: qty 30, 30d supply, fill #2

## 2022-05-07 MED ORDER — TRESIBA FLEXTOUCH 200 UNIT/ML ~~LOC~~ SOPN
100.0000 [IU] | PEN_INJECTOR | Freq: Every day | SUBCUTANEOUS | 1 refills | Status: DC
  Filled 2022-05-07: qty 3, 30d supply, fill #0

## 2022-05-07 NOTE — Progress Notes (Signed)
Patient ID: Tim Wilson, male   DOB: [birth year], 56 y.o.   MRN: [id number]           Reason for Appointment: Endocrinology follow-up   History of Present Illness   Diagnosis date: 2012  Previous history:  Non-insulin hypoglycemic drugs previously used: Metformin, Amaryl, Farxiga, Trulicity, Rybelsus, Januvia Reportedly had diarrhea with Invokana and could not afford co-pay for Rybelsus previously Insulin was started in 2012 but more regularly since 2019 He has previously taken NovoLog mix insulin and subsequently Toujeo and Humalog  A1c range in the last few years is: 8.8-14.6  Recent history:     Non-insulin hypoglycemic drugs: None     Insulin regimen: Toujeo max 100 units once daily, Humalog 10 units 2  times daily          Side effects from medications: Diarrhea from metformin  Current self management, blood sugar patterns and problems identified:  A1c is 12.6 compared to 11.2 He says he has been out of his Toujeo for several weeks as he did not have a refill but did not call for this Also even though he is not eating breakfast he is taking his Humalog only in the morning and before dinner and not at lunch This is despite eating a sandwich or other carbohydrates at lunch regularly He does not think he has a consistent diet and sometimes will eat sweets also Has not been motivated to check his blood sugar His lab glucose was 258 fasting Although he was prescribed the freestyle libre system in 1/23 he has not started this  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    Hypoglycemia:  none    Glucometer: Accu-Chek          Blood Glucose readings from home monitoring not available  Dietician visit: Most recent:   None recently  Weight control:  Wt Readings from Last 3 Encounters:  05/07/22 196 lb 3.2 oz (89 kg)  05/04/22 198 lb (89.8 kg)  02/05/22 204 lb (92.5 kg)            Diabetes labs:  Lab Results  Component Value Date   HGBA1C 12.6 (H)  05/04/2022   HGBA1C 11.2 (A) 02/05/2022   HGBA1C 9.8 (A) 10/30/2021   Lab Results  Component Value Date   MICROALBUR 24.3 (H) 05/04/2022   LDLCALC Comment (A) 12/28/2021   CREATININE 0.96 05/04/2022    Lab Results  Component Value Date   FRUCTOSAMINE 266 09/19/2015   FRUCTOSAMINE 284 08/31/2010     Allergies as of 05/07/2022       Reactions   Metformin And Related Diarrhea   Ramipril    cough        Medication List        Accurate as of May 07, 2022 11:29 AM. If you have any questions, ask your nurse or doctor.          STOP taking these medications    insulin glargine (2 Unit Dial) 300 UNIT/ML Solostar Pen Commonly known as: TOUJEO MAX Stopped by: Elayne Snare, MD       TAKE these medications    Accu-Chek Guide test strip Generic drug: glucose blood Use to check blood sugar twice a day   Accu-Chek Softclix Lancets lancets Use to check blood sugar twice a day   albuterol 108 (90 Base) MCG/ACT inhaler Commonly known as: VENTOLIN HFA Inhale into the lungs.   aspirin EC 325 MG tablet Take by mouth.   D3-50  1.25 MG (50000 UT) capsule Generic drug: Cholecalciferol Take 1 capsule (50,000 Units total) by mouth once a week.   dapagliflozin propanediol 5 MG Tabs tablet Commonly known as: Farxiga Take 1 tablet (5 mg total) by mouth daily. Started by: Elayne Snare, MD   fenofibrate 145 MG tablet Commonly known as: TRICOR Take 1 tablet (145 mg total) by mouth daily.   FreeStyle Libre 2 Sensor Misc Use to check blood sugar and change every 14 days   icosapent Ethyl 1 g capsule Commonly known as: VASCEPA TAKE 2 CAPSULES (2 G TOTAL) BY MOUTH 2 (TWO) TIMES DAILY.   insulin lispro 100 UNIT/ML KwikPen Commonly known as: HumaLOG KwikPen Inject 10 Units into the skin 3 (three) times daily with meals.   metoprolol succinate 25 MG 24 hr tablet Commonly known as: TOPROL-XL Take one-half tablet (12.5 mg total) by mouth daily. Take with or immediately  following a meal.   olmesartan 20 MG tablet Commonly known as: BENICAR Take 1 tablet (20 mg total) by mouth daily.   pioglitazone 15 MG tablet Commonly known as: Actos Take 1 tablet (15 mg total) by mouth daily. Started by: Elayne Snare, MD   rosuvastatin 20 MG tablet Commonly known as: CRESTOR Take 1 tablet (20 mg total) by mouth daily.   [name] 200 UNIT/ML FlexTouch Pen Generic drug: insulin degludec Inject 100 Units into the skin daily. Started by: Elayne Snare, MD   Unifine Pentips 32G X 6 MM Misc Generic drug: Insulin Pen Needle Inject into the skin 4 (four) times daily.        Allergies:  Allergies  Allergen Reactions   Metformin And Related Diarrhea   Ramipril     cough    Past Medical History:  Diagnosis Date   Diabetes mellitus    Hyperlipidemia    Hypertension     No past surgical history on file.  Family History  Problem Relation Age of Onset   Heart disease Father    Heart attack Father 72   Heart disease Brother    Heart attack Brother 3   Diabetes Mother    Heart attack Mother    Breast cancer Cousin    Heart attack Paternal Uncle    Heart attack Paternal Uncle    Stroke Paternal Uncle    Heart attack Paternal Uncle 40       5 HA in the past    Colon cancer Neg Hx    Stomach cancer Neg Hx    Esophageal cancer Neg Hx     Social History:  reports that he has never smoked. He has never used smokeless tobacco. He reports current alcohol use of about 2.0 standard drinks of alcohol per week. He reports that he does not use drugs.  Review of Systems:  Last diabetic eye exam date 3/23  Last foot exam date: 10/22  Symptoms of neuropathy: None  Urine microalbumin: Normal as of 8/23 No history of renal dysfunction  Lab Results  Component Value Date   CREATININE 0.96 05/04/2022   CREATININE 1.01 06/23/2021   CREATININE 1.23 12/11/2020    Hypertension:   Treatment includes Olmesartan  BP Readings from Last 3 Encounters:   05/07/22 124/84  05/04/22 138/84  02/05/22 (!) 148/92    Lipid management:    He has been prescribed Crestor 20 mg, Vascepa and fenofibrate by his PCP Had been on Vascepa since 3/22 with consistently high triglyceride levels  Lab Results  Component Value Date   CHOL 297 (  H) 05/04/2022   CHOL 240 (H) 12/28/2021   CHOL 218 (H) 06/23/2021   Lab Results  Component Value Date   HDL 32.50 (L) 05/04/2022   HDL 19 (L) 12/28/2021   HDL 30 (L) 06/23/2021   Lab Results  Component Value Date   LDLCALC Comment (A) 12/28/2021   LDLCALC 115 (H) 06/23/2021   [place]  12/11/2020     Comment:     . LDL cholesterol not calculated. Triglyceride levels greater than 400 mg/dL invalidate calculated LDL results. . Reference range: <100 . Desirable range <100 mg/dL for primary prevention;   <70 mg/dL for patients with CHD or diabetic patients  with > or = 2 CHD risk factors. Marland Kitchen LDL-C is now calculated using the Martin-Hopkins  calculation, which is a validated novel method providing  better accuracy than the Friedewald equation in the  estimation of LDL-C.  Cresenciano Genre et al. Annamaria Helling. 1610;960(45): 2061-2068  (http://education.[facility].com/faq/FAQ164)    Lab Results  Component Value Date   TRIG (H) 05/04/2022    1780.0 Triglyceride is over 400; calculations on Lipids are invalid.   TRIG 1,563 (HH) 12/28/2021   TRIG 420 (H) 06/23/2021   Lab Results  Component Value Date   CHOLHDL 9 05/04/2022   CHOLHDL 48.9 (H) 12/11/2020   CHOLHDL 8 05/15/2019   Lab Results  Component Value Date   LDLDIRECT 54.0 05/04/2022   LDLDIRECT 51 12/28/2021   LDLDIRECT 49.0 05/15/2019     Examination:   BP 124/84   Pulse (!) 58   Ht '5\' 8"'$  (1.727 m)   Wt 196 lb 3.2 oz (89 kg)   SpO2 94%   BMI 29.83 kg/m   Body mass index is 29.83 kg/m.    ASSESSMENT/ PLAN:    Diabetes type 2:   Current regimen: Humalog 10 units twice daily  See history of present illness for detailed discussion of  current diabetes management, blood sugar patterns and problems identified  A1c is 12.6  Blood glucose control is consistently poor and significantly worse As discussed above he is both insulin deficient and insulin resistant and currently on minimal pharmacological treatment with only Humalog He has not been checking his blood sugars and overall has had minimal diabetes education regarding self management, use of insulin and meal planning  Recommendations for diabetes:  Restart basal insulin  Discussed how basal insulin works, timing of injection, dosage, injection sites.   Start with 70 units TRESIBA U-200, this will be more efficient than Toujeo Have also discussed titration based on fasting blood sugar every 3 days by 2 units and at target of 90-130 for fasting Wilson.  Given a flowsheet with instructions on how to keep a record and adjust the doses  Co-pay card and brochure on the insulin given Also discussed in detail the need for mealtime insulin to cover postprandial spikes, action of mealtime insulin,timing and action of the rapid acting insulin as well as starting dose and dosage titration to target the two-hour Wilson of under 180 He does need to change his Humalog to lunch and dinner currently and start with at least 20 units He can continue to increase by 2 to 4 units to keep his blood sugar from rising more than 50 mg over the previously Wilson He does need to start using continuous glucose monitoring as he is not well motivated to check his blood sugars with fingersticks and will be prescribed freestyle libre 3, discussed benefits of using this, however it will be used and he  can use his smart phone to do the readings; if he needs assistance with starting this this can be done to the diabetes educator He will also benefit from an SGLT2 drug like FARXIGA which she had taken before but unclear why he stopped it Discussed action of SGLT 2 drugs on lowering glucose by decreasing  kidney absorption of glucose, benefits of weight loss, cardiorenal benefits and lower blood pressure He will start with 5 mg Farxiga daily and monitor blood pressure more closely while using this   MIXED hyperlipidemia: He has marked increase in triglycerides appearing to be from significant insulin resistance as well as likely worse from hyperglycemia provided consistent diet  Discussed lowering saturated fat and carbohydrate intake, avoiding sweets Also needs to likely lose weight which will help with insulin resistance For both diabetes control and high TRIGLYCERIDES he will be tried on 15 mg of Actos Discussed benefits with insulin sensitivity as well as possible side effects such as edema and weight gain especially with higher doses Will need fasting lipids once blood sugars are better controlled  Patient Instructions   Tresiba insulin: This insulin provides blood sugar control for up to 24 hours.   Start with 70 units at bedtime daily and increase by 6 units every 3 days until the waking up sugars are under 140-150 Then continue the same dose.  If blood sugar is under 90 for 2 days in a row, reduce the dose by 6 units.  Note that this insulin does not control the rise of blood sugar with meals     Humalog 20 at meals  Total visit time for evaluation and management of above problems, counseling on various aspects of diabetes management = 45 minutes  Lamark Schue 05/07/2022, 11:29 AM

## 2022-05-07 NOTE — Patient Instructions (Addendum)
Tim Wilson insulin: This insulin provides blood sugar control for up to 24 hours.   Start with 70 units at bedtime daily and increase by 6 units every 3 days until the waking up sugars are under 140-150 Then continue the same dose.  If blood sugar is under 90 for 2 days in a row, reduce the dose by 6 units.  Note that this insulin does not control the rise of blood sugar with meals     Humalog 20 at meals and continue to increase by 2 to 4 units to keep the blood sugar from rising more than 50 points

## 2022-05-09 MED ORDER — FREESTYLE LIBRE 3 SENSOR MISC
3 refills | Status: DC
  Filled 2022-05-09: qty 2, 28d supply, fill #0

## 2022-05-10 ENCOUNTER — Other Ambulatory Visit (HOSPITAL_BASED_OUTPATIENT_CLINIC_OR_DEPARTMENT_OTHER): Payer: Self-pay

## 2022-05-11 ENCOUNTER — Other Ambulatory Visit (HOSPITAL_BASED_OUTPATIENT_CLINIC_OR_DEPARTMENT_OTHER): Payer: Self-pay

## 2022-05-11 ENCOUNTER — Other Ambulatory Visit: Payer: Self-pay | Admitting: Internal Medicine

## 2022-05-11 DIAGNOSIS — E785 Hyperlipidemia, unspecified: Secondary | ICD-10-CM

## 2022-05-11 DIAGNOSIS — Z794 Long term (current) use of insulin: Secondary | ICD-10-CM

## 2022-05-11 MED ORDER — ROSUVASTATIN CALCIUM 20 MG PO TABS
20.0000 mg | ORAL_TABLET | Freq: Every day | ORAL | 0 refills | Status: DC
  Filled 2022-05-11: qty 30, 30d supply, fill #0
  Filled 2022-07-20: qty 30, 30d supply, fill #1
  Filled 2022-09-30: qty 30, 30d supply, fill #2

## 2022-05-24 ENCOUNTER — Other Ambulatory Visit (HOSPITAL_BASED_OUTPATIENT_CLINIC_OR_DEPARTMENT_OTHER): Payer: Self-pay

## 2022-06-02 ENCOUNTER — Other Ambulatory Visit (HOSPITAL_BASED_OUTPATIENT_CLINIC_OR_DEPARTMENT_OTHER): Payer: Self-pay

## 2022-06-08 ENCOUNTER — Encounter: Payer: Managed Care, Other (non HMO) | Attending: Internal Medicine | Admitting: Nutrition

## 2022-06-08 DIAGNOSIS — E139 Other specified diabetes mellitus without complications: Secondary | ICD-10-CM | POA: Insufficient documentation

## 2022-06-09 NOTE — Progress Notes (Signed)
Patient is here today to be trained on the use of the [place] 3.  Says he was using this, and it fell off X3, and readings were not as accurate as his Libre 2.  Is now back on his Elenor [place] 2 and reports that these be sent to his pharmacy.  He is wanting to discuss his diet. Medications;  currently on Toujeo at noon-70 u.  Humalog:20u  acB and HS.   SBGM:  acB: less than 130, acL: never checks this, acS: 180s  HS: never checks this.   Typical day: 8:30AM: up.  Scans sensor:  130 or less 9AM:  20u Humalog :  1 boiled egg, or  4 ounces OJ,                                   Weekend: 2 eggs fried, 3 pieces of bacon, or or frosted flakes cereal with 2% milk.  12-2: Lunch:  only eats lunch 2X/wk:  ham sandwich with regular sprite, or gatorade 5-6PM: 4-6 ounces of protein-3 chicken legs, with 1-2 green veg. (Broccoli, or kale, or cabbage, or turnip greens, water or unsweet tea 9-10PM: small bag of corn chips,or potato chips, or handful of cherries 10PM: bed.  20u Humalog Denies snacking between meals or during the night Discussion:  Discussed how Humalog works, and need to take this acS, not HS.  He agreed to do this. Discussed importance of carbohydrate with all meals when taking insulin.  Discussed what carb are and meal examples given to him for breakfast, like 2 pieces of toast with peanut butter or melted cheese, etc.  HE agreed to do this. Discussed need to test blood sugars ac and HS.  Discussed why he will do this and the need to see if readings are high acS when eating lunch.  He was told that he may need to give Humalog acS on days when eating sandwich.  Suggested he test 2hr. pcL and again acS.  If 2hr. Pc reading is over 170, he will need at least 5u acS.  He reported good understanding of this.  He was shown the [place] and discussed that it holds 3 days worth of Humalog insulin and how to use this.  Brochure given and he will review this and call me back if interested Discussed need to stop sweet  drinks or increase dose of insulin by 2-4u for this drink, to cover blood sugar rise from this--and resulting weight gain from this extra insulin

## 2022-06-10 NOTE — Patient Instructions (Signed)
Stop all sweet drinks Take Humalog before supper, not at bedtime Make sure all meals have carbohydrate--fruit, milk, or starches

## 2022-07-07 ENCOUNTER — Other Ambulatory Visit (HOSPITAL_BASED_OUTPATIENT_CLINIC_OR_DEPARTMENT_OTHER): Payer: Self-pay

## 2022-07-07 ENCOUNTER — Other Ambulatory Visit: Payer: Self-pay | Admitting: Endocrinology

## 2022-07-07 MED ORDER — FREESTYLE LIBRE 2 SENSOR MISC
1.0000 | 3 refills | Status: DC
  Filled 2022-07-07: qty 2, 28d supply, fill #0
  Filled 2022-09-08: qty 2, 28d supply, fill #1
  Filled 2022-11-16: qty 2, 28d supply, fill #2

## 2022-07-20 ENCOUNTER — Other Ambulatory Visit (HOSPITAL_BASED_OUTPATIENT_CLINIC_OR_DEPARTMENT_OTHER): Payer: Self-pay

## 2022-07-21 ENCOUNTER — Other Ambulatory Visit (HOSPITAL_BASED_OUTPATIENT_CLINIC_OR_DEPARTMENT_OTHER): Payer: Self-pay

## 2022-07-21 MED ORDER — FLUARIX QUADRIVALENT 0.5 ML IM SUSY
PREFILLED_SYRINGE | INTRAMUSCULAR | 0 refills | Status: DC
  Filled 2022-07-21: qty 0.5, 1d supply, fill #0

## 2022-07-21 MED ORDER — COMIRNATY 30 MCG/0.3ML IM SUSY
PREFILLED_SYRINGE | INTRAMUSCULAR | 0 refills | Status: DC
  Filled 2022-07-21: qty 0.3, 1d supply, fill #0

## 2022-07-26 ENCOUNTER — Other Ambulatory Visit (INDEPENDENT_AMBULATORY_CARE_PROVIDER_SITE_OTHER): Payer: Managed Care, Other (non HMO)

## 2022-07-26 DIAGNOSIS — E118 Type 2 diabetes mellitus with unspecified complications: Secondary | ICD-10-CM

## 2022-07-26 DIAGNOSIS — Z794 Long term (current) use of insulin: Secondary | ICD-10-CM

## 2022-07-26 DIAGNOSIS — E1165 Type 2 diabetes mellitus with hyperglycemia: Secondary | ICD-10-CM

## 2022-07-26 LAB — MICROALBUMIN / CREATININE URINE RATIO
Creatinine,U: 133.4 mg/dL
Microalb Creat Ratio: 8.3 mg/g (ref 0.0–30.0)
Microalb, Ur: 11 mg/dL — ABNORMAL HIGH (ref 0.0–1.9)

## 2022-07-26 LAB — BASIC METABOLIC PANEL
BUN: 16 mg/dL (ref 6–23)
CO2: 26 mEq/L (ref 19–32)
Calcium: 9.7 mg/dL (ref 8.4–10.5)
Chloride: 105 mEq/L (ref 96–112)
Creatinine, Ser: 0.96 mg/dL (ref 0.40–1.50)
GFR: 88.31 mL/min (ref >60.00)
Glucose, Bld: 140 mg/dL — ABNORMAL HIGH (ref 70–99)
Potassium: 4.1 mEq/L (ref 3.5–5.1)
Sodium: 138 mEq/L (ref 135–145)

## 2022-07-26 LAB — HEMOGLOBIN A1C: Hgb A1c MFr Bld: 8.7 % — ABNORMAL HIGH (ref 4.6–6.5)

## 2022-07-27 LAB — FRUCTOSAMINE: Fructosamine: 262 umol/L (ref 0–285)

## 2022-07-28 ENCOUNTER — Ambulatory Visit: Payer: Managed Care, Other (non HMO) | Admitting: Endocrinology

## 2022-07-28 VITALS — BP 122/70 | HR 102 | Ht 68.0 in | Wt 209.0 lb

## 2022-07-28 DIAGNOSIS — Z794 Long term (current) use of insulin: Secondary | ICD-10-CM | POA: Diagnosis not present

## 2022-07-28 DIAGNOSIS — E1165 Type 2 diabetes mellitus with hyperglycemia: Secondary | ICD-10-CM | POA: Diagnosis not present

## 2022-07-28 DIAGNOSIS — E782 Mixed hyperlipidemia: Secondary | ICD-10-CM | POA: Diagnosis not present

## 2022-07-28 MED ORDER — DAPAGLIFLOZIN PROPANEDIOL 10 MG PO TABS: 10.0000 mg | ORAL_TABLET | Freq: Every day | ORAL | 5 refills | Status: DC

## 2022-07-28 NOTE — Patient Instructions (Addendum)
Humalog 10-15 at hi carb meals  Tresiba 100 units daily replaces Toujeo  Toujeo 110 once daily till over  Walk more

## 2022-07-28 NOTE — Progress Notes (Signed)
Patient ID: Tim Wilson, male   DOB: 1966-04-06, 56 y.o.   MRN: [id number]           Reason for Appointment: Endocrinology follow-up   History of Present Illness   Diagnosis date: 2012  Previous history:  Non-insulin hypoglycemic drugs previously used: Metformin, Amaryl, Farxiga, Trulicity, Rybelsus, Januvia Reportedly had diarrhea with Invokana and could not afford co-pay for Rybelsus previously Insulin was started in 2012 but more regularly since 2019 He has previously taken NovoLog mix insulin and subsequently Toujeo and Humalog  A1c range in the last few years is: 8.8-14.6  Recent history:     Non-insulin hypoglycemic drugs: Actos 15 mg daily, Farxiga 5 mg daily     Insulin regimen: Toujeo max 110 units daily and 3 doses         Side effects from medications: Diarrhea from metformin  Current self management, blood sugar patterns and problems identified:  A1c is 8.7, previously was 12.6 compared to 11.2 He says he has been back on his Toujeo but he ran out of Humalog He misunderstood instructions for his insulin and does not understand the differences between Toujeo and Humalog Currently taking Toujeo 3 times a day and not Humalog He is taking a total of 110 units For some time has not taken Humalog  However he has taken Actos and Farxiga, Actos was started because of severe hypertriglyceridemia However his blood sugar patterns from freestyle [employer] indicates that he has high fasting and Premeal readings and generally no significant postprandial readings that are high Lab glucose was 140 fasting  He is not doing any formal exercise He was referred for diabetes education but he did not make an appointment He cannot use the freestyle libre continuously because of cost  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    Hypoglycemia:  none    Currently using Freestyle libre version 2 Data available between 10/5 and 10/18   OVERNIGHT blood sugars are  mostly mildly increased throughout and fairly stable, slightly higher starting off at midnight No hypoglycemia overnight Daytime blood sugars are also averaging between 150-160 before meals He has inconsistent rise in blood sugars over 200 after meals at various times but postprandial are which is not significantly high except late at night No hypoglycemia  CGM use % of time 86  2-week average/GV 153  Time in range 78  % Time Above 180 22  % Time above 250   % Time Below 70 0     PRE-MEAL Fasting Lunch Dinner Bedtime Overall  Glucose range:       Averages: 149   170 153   POST-MEAL PC Breakfast PC Lunch PC Dinner  Glucose range:     Averages:  152 158      Blood Glucose readings from home monitoring not available  Dietician visit: Most recent:   None recently  Weight control:  Wt Readings from Last 3 Encounters:  07/28/22 209 lb (94.8 kg)  05/07/22 196 lb 3.2 oz (89 kg)  05/04/22 198 lb (89.8 kg)            Diabetes labs:  Lab Results  Component Value Date   HGBA1C 8.7 Repeated and verified X2. (H) 07/26/2022   HGBA1C 12.6 (H) 05/04/2022   HGBA1C 11.2 (A) 02/05/2022   Lab Results  Component Value Date   MICROALBUR 11.0 (H) 07/26/2022   LDLCALC Comment (A) 12/28/2021   CREATININE 0.96 07/26/2022    Lab Results  Component  Value Date   FRUCTOSAMINE 262 07/26/2022   FRUCTOSAMINE 266 09/19/2015   FRUCTOSAMINE 284 08/31/2010     Allergies as of 07/28/2022       Reactions   Metformin And Related Diarrhea   Ramipril    cough        Medication List        Accurate as of July 28, 2022  9:42 AM. If you have any questions, ask your nurse or doctor.          Accu-Chek Guide test strip Generic drug: glucose blood Use to check blood sugar twice a day   Accu-Chek Softclix Lancets lancets Use to check blood sugar twice a day   albuterol 108 (90 Base) MCG/ACT inhaler Commonly known as: VENTOLIN HFA Inhale into the lungs.   aspirin EC 325 MG  tablet Take by mouth.   Comirnaty syringe Generic drug: COVID-19 mRNA vaccine 2023-2024 Inject into the muscle.   D3-50 1.25 MG (50000 UT) capsule Generic drug: Cholecalciferol Take 1 capsule (50,000 Units total) by mouth once a week.   Farxiga 5 MG Tabs tablet Generic drug: dapagliflozin propanediol Take 1 tablet (5 mg total) by mouth daily.   fenofibrate 145 MG tablet Commonly known as: TRICOR Take 1 tablet (145 mg total) by mouth daily.   Fluarix Quadrivalent 0.5 ML injection Generic drug: influenza vac split quadrivalent PF Inject into the muscle.   FreeStyle Libre 3 Sensor Misc Place 1 sensor on the skin every 14 days. Use to check glucose continuously   FreeStyle Libre 2 Sensor Misc Use to check blood sugar and change every 14 days   icosapent Ethyl 1 g capsule Commonly known as: VASCEPA TAKE 2 CAPSULES (2 G TOTAL) BY MOUTH 2 (TWO) TIMES DAILY.   insulin lispro 100 UNIT/ML KwikPen Commonly known as: HumaLOG KwikPen Inject 10 Units into the skin 3 (three) times daily with meals.   metoprolol succinate 25 MG 24 hr tablet Commonly known as: TOPROL-XL Take one-half tablet (12.5 mg total) by mouth daily. Take with or immediately following a meal.   olmesartan 20 MG tablet Commonly known as: BENICAR Take 1 tablet (20 mg total) by mouth daily.   pioglitazone 15 MG tablet Commonly known as: Actos Take 1 tablet (15 mg total) by mouth daily.   rosuvastatin 20 MG tablet Commonly known as: CRESTOR Take 1 tablet (20 mg total) by mouth daily.   [name] 200 UNIT/ML FlexTouch Pen Generic drug: insulin degludec Inject 100 Units into the skin daily.   Unifine Pentips 32G X 6 MM Misc Generic drug: Insulin Pen Needle Inject into the skin 4 (four) times daily.        Allergies:  Allergies  Allergen Reactions   Metformin And Related Diarrhea   Ramipril     cough    Past Medical History:  Diagnosis Date   Diabetes mellitus    Hyperlipidemia     Hypertension     No past surgical history on file.  Family History  Problem Relation Age of Onset   Heart disease Father    Heart attack Father 40   Heart disease Brother    Heart attack Brother 30   Diabetes Mother    Heart attack Mother    Breast cancer Cousin    Heart attack Paternal Uncle    Heart attack Paternal Uncle    Stroke Paternal Uncle    Heart attack Paternal Uncle 69       5 HA in the past  Colon cancer Neg Hx    Stomach cancer Neg Hx    Esophageal cancer Neg Hx     Social History:  reports that he has never smoked. He has never used smokeless tobacco. He reports current alcohol use of about 2.0 standard drinks of alcohol per week. He reports that he does not use drugs.  Review of Systems:  Last diabetic eye exam date 3/23  Last foot exam date: 10/22  Symptoms of neuropathy: None  Urine microalbumin: Normal as of 8/23 No history of renal dysfunction  Lab Results  Component Value Date   CREATININE 0.96 07/26/2022   CREATININE 0.96 05/04/2022   CREATININE 1.01 06/23/2021    Hypertension:   Treatment includes Olmesartan  BP Readings from Last 3 Encounters:  07/28/22 122/70  05/07/22 124/84  05/04/22 138/84    Lipid management:    He has been prescribed Crestor 20 mg, Vascepa and fenofibrate by his PCP Had been on Vascepa since 3/22 with consistently high triglyceride levels  Lab Results  Component Value Date   CHOL 297 (H) 05/04/2022   CHOL 240 (H) 12/28/2021   CHOL 218 (H) 06/23/2021   Lab Results  Component Value Date   HDL 32.50 (L) 05/04/2022   HDL 19 (L) 12/28/2021   HDL 30 (L) 06/23/2021   Lab Results  Component Value Date   LDLCALC Comment (A) 12/28/2021   LDLCALC 115 (H) 06/23/2021   [place]  12/11/2020     Comment:     . LDL cholesterol not calculated. Triglyceride levels greater than 400 mg/dL invalidate calculated LDL results. . Reference range: <100 . Desirable range <100 mg/dL for primary prevention;   <70  mg/dL for patients with CHD or diabetic patients  with > or = 2 CHD risk factors. Marland Kitchen LDL-C is now calculated using the Martin-Hopkins  calculation, which is a validated novel method providing  better accuracy than the Friedewald equation in the  estimation of LDL-C.  Cresenciano Genre et al. Annamaria Helling. 7124;580(99): 2061-2068  (http://education.[facility].com/faq/FAQ164)    Lab Results  Component Value Date   TRIG (H) 05/04/2022    1780.0 Triglyceride is over 400; calculations on Lipids are invalid.   TRIG 1,563 (HH) 12/28/2021   TRIG 420 (H) 06/23/2021   Lab Results  Component Value Date   CHOLHDL 9 05/04/2022   CHOLHDL 48.9 (H) 12/11/2020   CHOLHDL 8 05/15/2019   Lab Results  Component Value Date   LDLDIRECT 54.0 05/04/2022   LDLDIRECT 51 12/28/2021   LDLDIRECT 49.0 05/15/2019     Examination:   BP 122/70   Pulse (!) 102   Ht '5\' 8"'$  (1.727 m)   Wt 209 lb (94.8 kg)   SpO2 92%   BMI 31.78 kg/m   Body mass index is 31.78 kg/m.    ASSESSMENT/ PLAN:    Diabetes type 2:   Current regimen: Toujeo 110 units, Actos and Farxiga 5 mg  See history of present illness for detailed discussion of current diabetes management, blood sugar patterns and problems identified  A1c is 8.7  Blood glucose control is significantly improved  He may be benefiting from both using Actos and Farxiga and also going back on basal insulin However he has not taken any mealtime insulin and does not understand the differences between basal and bolus Explained in detail that Toujeo is a basal insulin and needs to be taken once a day He will take Humalog again when he is eating a larger carbohydrate meal or foods like pasta did  make his blood sugars go up He also needs to add significant amount of exercise for weight loss  Also increase his [place] to 10 mg When finished with Toujeo he will start with TRESIBA 100 units daily   MIXED hyperlipidemia: Needs fasting lipids on next visit  There are no  Patient Instructions on file for this visit.  Total visit time for evaluation and management of above problems, counseling = 30 minutes   Elayne Snare 07/28/2022, 9:42 AM

## 2022-07-29 ENCOUNTER — Telehealth: Payer: Self-pay

## 2022-07-29 NOTE — Telephone Encounter (Signed)
I called patient and left voicemail to see if he needed both tresiba and humalog for refills and what pharmacy he would like it sent to.

## 2022-08-02 ENCOUNTER — Encounter: Payer: Self-pay | Admitting: Endocrinology

## 2022-09-06 ENCOUNTER — Other Ambulatory Visit (HOSPITAL_BASED_OUTPATIENT_CLINIC_OR_DEPARTMENT_OTHER): Payer: Self-pay

## 2022-09-06 ENCOUNTER — Other Ambulatory Visit: Payer: Self-pay | Admitting: Endocrinology

## 2022-09-08 ENCOUNTER — Other Ambulatory Visit (HOSPITAL_BASED_OUTPATIENT_CLINIC_OR_DEPARTMENT_OTHER): Payer: Self-pay

## 2022-09-30 ENCOUNTER — Other Ambulatory Visit (HOSPITAL_BASED_OUTPATIENT_CLINIC_OR_DEPARTMENT_OTHER): Payer: Self-pay

## 2022-10-21 ENCOUNTER — Telehealth: Payer: Self-pay

## 2022-10-21 NOTE — Telephone Encounter (Signed)
Approved Coverage Start Date:09/21/2022;Coverage End Date:10/21/2023;

## 2022-10-21 NOTE — Telephone Encounter (Signed)
Key: [id number]

## 2022-10-23 ENCOUNTER — Other Ambulatory Visit: Payer: Self-pay | Admitting: Endocrinology

## 2022-10-23 DIAGNOSIS — E139 Other specified diabetes mellitus without complications: Secondary | ICD-10-CM

## 2022-10-23 DIAGNOSIS — E781 Pure hyperglyceridemia: Secondary | ICD-10-CM

## 2022-11-05 ENCOUNTER — Other Ambulatory Visit: Payer: Managed Care, Other (non HMO)

## 2022-11-10 ENCOUNTER — Ambulatory Visit: Payer: Managed Care, Other (non HMO) | Admitting: Endocrinology

## 2022-11-12 ENCOUNTER — Other Ambulatory Visit (HOSPITAL_BASED_OUTPATIENT_CLINIC_OR_DEPARTMENT_OTHER): Payer: Self-pay

## 2022-11-12 ENCOUNTER — Other Ambulatory Visit: Payer: Self-pay | Admitting: Endocrinology

## 2022-11-12 ENCOUNTER — Other Ambulatory Visit: Payer: Self-pay | Admitting: Internal Medicine

## 2022-11-12 ENCOUNTER — Other Ambulatory Visit: Payer: Self-pay

## 2022-11-12 DIAGNOSIS — R800 Isolated proteinuria: Secondary | ICD-10-CM

## 2022-11-12 DIAGNOSIS — I10 Essential (primary) hypertension: Secondary | ICD-10-CM

## 2022-11-12 MED ORDER — PIOGLITAZONE HCL 15 MG PO TABS
15.0000 mg | ORAL_TABLET | Freq: Every day | ORAL | 2 refills | Status: DC
  Filled 2022-11-12: qty 30, 30d supply, fill #0

## 2022-11-12 MED ORDER — OLMESARTAN MEDOXOMIL 20 MG PO TABS
20.0000 mg | ORAL_TABLET | Freq: Every day | ORAL | 0 refills | Status: AC
  Filled 2022-11-12: qty 30, 30d supply, fill #0
  Filled 2023-03-08: qty 30, 30d supply, fill #1
  Filled 2023-05-23: qty 30, 30d supply, fill #2

## 2022-11-16 ENCOUNTER — Other Ambulatory Visit (HOSPITAL_BASED_OUTPATIENT_CLINIC_OR_DEPARTMENT_OTHER): Payer: Self-pay

## 2022-11-22 ENCOUNTER — Telehealth: Payer: Self-pay

## 2022-11-22 NOTE — Telephone Encounter (Signed)
[id number]  Per CoverMyMeds: Drug is covered by current benefit plan. No further PA activity needed

## 2022-12-22 ENCOUNTER — Ambulatory Visit: Payer: Managed Care, Other (non HMO) | Admitting: Student

## 2023-01-03 ENCOUNTER — Other Ambulatory Visit (INDEPENDENT_AMBULATORY_CARE_PROVIDER_SITE_OTHER): Payer: Managed Care, Other (non HMO)

## 2023-01-03 DIAGNOSIS — E1165 Type 2 diabetes mellitus with hyperglycemia: Secondary | ICD-10-CM | POA: Diagnosis not present

## 2023-01-03 DIAGNOSIS — Z794 Long term (current) use of insulin: Secondary | ICD-10-CM | POA: Diagnosis not present

## 2023-01-03 DIAGNOSIS — E782 Mixed hyperlipidemia: Secondary | ICD-10-CM

## 2023-01-03 LAB — COMPREHENSIVE METABOLIC PANEL
ALT: 15 U/L (ref 0–53)
AST: 34 U/L (ref 0–37)
Albumin: 4.7 g/dL (ref 3.5–5.2)
Alkaline Phosphatase: 46 U/L (ref 39–117)
BUN: 11 mg/dL (ref 6–23)
CO2: 23 mEq/L (ref 19–32)
Calcium: 10.2 mg/dL (ref 8.4–10.5)
Chloride: 97 mEq/L (ref 96–112)
Creatinine, Ser: 1.54 mg/dL — ABNORMAL HIGH (ref 0.40–1.50)
GFR: 49.93 mL/min — ABNORMAL LOW (ref >60.00)
Glucose, Bld: 243 mg/dL — ABNORMAL HIGH (ref 70–99)
Potassium: 4 mEq/L (ref 3.5–5.1)
Sodium: 128 mEq/L — ABNORMAL LOW (ref 135–145)
Total Bilirubin: 0.4 mg/dL (ref 0.2–1.2)
Total Protein: 8.3 g/dL (ref 6.0–8.3)

## 2023-01-03 LAB — LIPID PANEL
Cholesterol: 421 mg/dL — ABNORMAL HIGH (ref 0–200)
HDL: 26.5 mg/dL — ABNORMAL LOW (ref >39.00)
Total CHOL/HDL Ratio: 16
Triglycerides: 3933 mg/dL — ABNORMAL HIGH (ref 0.0–149.0)

## 2023-01-03 LAB — HEMOGLOBIN A1C: Hgb A1c MFr Bld: 10 % — ABNORMAL HIGH (ref 4.6–6.5)

## 2023-01-03 LAB — LDL CHOLESTEROL, DIRECT: Direct LDL: 70 mg/dL

## 2023-01-06 ENCOUNTER — Ambulatory Visit: Payer: Managed Care, Other (non HMO) | Admitting: Endocrinology

## 2023-01-06 ENCOUNTER — Other Ambulatory Visit: Payer: Self-pay | Admitting: Internal Medicine

## 2023-01-06 ENCOUNTER — Encounter: Payer: Self-pay | Admitting: Endocrinology

## 2023-01-06 ENCOUNTER — Other Ambulatory Visit (HOSPITAL_BASED_OUTPATIENT_CLINIC_OR_DEPARTMENT_OTHER): Payer: Self-pay

## 2023-01-06 VITALS — BP 134/88 | HR 93 | Ht 68.0 in | Wt 203.0 lb

## 2023-01-06 DIAGNOSIS — Z794 Long term (current) use of insulin: Secondary | ICD-10-CM

## 2023-01-06 DIAGNOSIS — E781 Pure hyperglyceridemia: Secondary | ICD-10-CM

## 2023-01-06 DIAGNOSIS — E1165 Type 2 diabetes mellitus with hyperglycemia: Secondary | ICD-10-CM | POA: Diagnosis not present

## 2023-01-06 DIAGNOSIS — E785 Hyperlipidemia, unspecified: Secondary | ICD-10-CM

## 2023-01-06 MED ORDER — DAPAGLIFLOZIN PROPANEDIOL 10 MG PO TABS
10.0000 mg | ORAL_TABLET | Freq: Every day | ORAL | 5 refills | Status: DC
  Filled 2023-01-06: qty 30, 30d supply, fill #0

## 2023-01-06 MED ORDER — ACCU-CHEK GUIDE VI STRP
1.0000 | ORAL_STRIP | Freq: Two times a day (BID) | 3 refills | Status: AC
  Filled 2023-01-06: qty 50, 25d supply, fill #0

## 2023-01-06 MED ORDER — TRESIBA FLEXTOUCH 200 UNIT/ML ~~LOC~~ SOPN
100.0000 [IU] | PEN_INJECTOR | Freq: Every day | SUBCUTANEOUS | 1 refills | Status: DC
  Filled 2023-01-06: qty 9, 18d supply, fill #0
  Filled 2023-01-24: qty 9, 18d supply, fill #1

## 2023-01-06 MED ORDER — DEXCOM G7 SENSOR MISC
1.0000 | 3 refills | Status: DC
  Filled 2023-01-06: qty 3, 30d supply, fill #0

## 2023-01-06 MED ORDER — ROSUVASTATIN CALCIUM 20 MG PO TABS
20.0000 mg | ORAL_TABLET | Freq: Every day | ORAL | 0 refills | Status: AC
Start: 2023-01-06 — End: 2024-01-06
  Filled 2023-01-06: qty 30, 30d supply, fill #0
  Filled 2023-03-08: qty 30, 30d supply, fill #1
  Filled 2023-05-23: qty 30, 30d supply, fill #2

## 2023-01-06 MED ORDER — FENOFIBRATE 145 MG PO TABS
145.0000 mg | ORAL_TABLET | Freq: Every day | ORAL | 0 refills | Status: DC
Start: 2023-01-06 — End: 2023-02-10
  Filled 2023-01-06: qty 30, 30d supply, fill #0

## 2023-01-06 MED ORDER — ICOSAPENT ETHYL 1 G PO CAPS
2.0000 g | ORAL_CAPSULE | Freq: Two times a day (BID) | ORAL | 1 refills | Status: DC
Start: 2023-01-06 — End: 2023-04-12
  Filled 2023-01-06: qty 120, 30d supply, fill #0

## 2023-01-06 NOTE — Patient Instructions (Addendum)
Take 20 Humalog at supper Keep blood sugars 2 hours after eating and 180, blood sugar should not rise more than 60 to 70 mg after eating  Tresiba 100 daily +/- 10 U to keep around 120-130  Carbohydrate intake such as white bread should be reduced and add more protein

## 2023-01-06 NOTE — Progress Notes (Signed)
Patient ID: Tim Wilson, male   DOB: 05/20/1966, 57 y.o.   MRN: [id number]           Reason for Appointment: Endocrinology follow-up   History of Present Illness   Diagnosis date: 2012  Previous history:  Non-insulin hypoglycemic drugs previously used: Metformin, Amaryl, Farxiga, Trulicity, Rybelsus, Januvia Reportedly had diarrhea with Invokana and could not afford co-pay for Rybelsus previously Insulin was started in 2012 but more regularly since 2019 He has previously taken NovoLog mix insulin and subsequently Toujeo and Humalog  A1c range in the last few years is: 8.8-14.6  Recent history:     Non-insulin hypoglycemic drugs: Actos 15 mg daily,     Insulin regimen: Humalog 10 units twice daily        Side effects from medications: Diarrhea from metformin  Current self management, blood sugar patterns and problems identified:  A1c is 10% compared to 8.7 and generally persistently high  Despite specific medication instructions and explanation of the differences between basal and bolus insulin he is still confused about these and is only taking Humalog He says he has not taken Toujeo or [place] for several months, likely since November He was told to switch from [place] to [place] which had been prescribed Also has not monitored his blood sugars since about February He says it is not able to afford the freestyle libre as the co-pay is high Also he ran out of his Wilder Glade, was supposed to go back to 10 mg and is only taking Actos Blood sugar data from February indicates that his average blood sugar was 197 with only 34% time in range Previously with taking Toujeo he was 78% time in range Even with Humalog 10 units twice daily his blood sugars are mostly higher after dinner His weight has fluctuated Lab glucose was 243 fasting He is not doing any formal exercise He was referred for diabetes education but he did not make an appointment Does not have the strips for his  monitor  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    Hypoglycemia:  none    Previous data:  OVERNIGHT blood sugars are mostly mildly increased throughout and fairly stable, slightly higher starting off at midnight No hypoglycemia overnight Daytime blood sugars are also averaging between 150-160 before meals He has inconsistent rise in blood sugars over 200 after meals at various times but postprandial are which is not significantly high except late at night No hypoglycemia  CGM use % of time 86  2-week average/GV 153  Time in range 78  % Time Above 180 22  % Time above 250   % Time Below 70 0     PRE-MEAL Fasting Lunch Dinner Bedtime Overall  Glucose range:       Averages: 149   170 153   POST-MEAL PC Breakfast PC Lunch PC Dinner  Glucose range:     Averages:  152 158     Dietician visit: Most recent:   None recently  Weight control:  Wt Readings from Last 3 Encounters:  01/06/23 203 lb (92.1 kg)  07/28/22 209 lb (94.8 kg)  05/07/22 196 lb 3.2 oz (89 kg)            Diabetes labs:  Lab Results  Component Value Date   HGBA1C 10.0 (H) 01/03/2023   HGBA1C 8.7 Repeated and verified X2. (H) 07/26/2022   HGBA1C 12.6 (H) 05/04/2022   Lab Results  Component Value Date  MICROALBUR 11.0 (H) 07/26/2022   LDLCALC Comment (A) 12/28/2021   CREATININE 1.54 (H) 01/03/2023    Lab Results  Component Value Date   FRUCTOSAMINE 262 07/26/2022   FRUCTOSAMINE 266 09/19/2015   FRUCTOSAMINE 284 08/31/2010     Allergies as of 01/06/2023       Reactions   Metformin And Related Diarrhea   Ramipril    cough        Medication List        Accurate as of January 06, 2023 11:07 AM. If you have any questions, ask your nurse or doctor.          Accu-Chek Guide test strip Generic drug: glucose blood Use to check blood sugar twice a day   Accu-Chek Softclix Lancets lancets Use to check blood sugar twice a day   albuterol 108 (90 Base)  MCG/ACT inhaler Commonly known as: VENTOLIN HFA Inhale into the lungs.   aspirin EC 325 MG tablet Take by mouth.   Comirnaty syringe Generic drug: COVID-19 mRNA vaccine 2023-2024 Inject into the muscle.   D3-50 1.25 MG (50000 UT) capsule Generic drug: Cholecalciferol Take 1 capsule (50,000 Units total) by mouth once a week.   dapagliflozin propanediol 10 MG Tabs tablet Commonly known as: Farxiga Take 1 tablet (10 mg total) by mouth daily before breakfast.   fenofibrate 145 MG tablet Commonly known as: TRICOR Take 1 tablet (145 mg total) by mouth daily.   Fluarix Quadrivalent 0.5 ML injection Generic drug: influenza vac split quadrivalent PF Inject into the muscle.   FreeStyle Libre 2 Sensor Misc Use to check blood sugar and change every 14 days   HumaLOG KwikPen 100 UNIT/ML KwikPen Generic drug: insulin lispro INJECT 10 UNITS DAILY WITH BREAKFAST   icosapent Ethyl 1 g capsule Commonly known as: VASCEPA TAKE 2 CAPSULES (2 G TOTAL) BY MOUTH 2 (TWO) TIMES DAILY.   metoprolol succinate 25 MG 24 hr tablet Commonly known as: TOPROL-XL Take one-half tablet (12.5 mg total) by mouth daily. Take with or immediately following a meal.   olmesartan 20 MG tablet Commonly known as: BENICAR Take 1 tablet (20 mg total) by mouth daily.   pioglitazone 15 MG tablet Commonly known as: Actos Take 1 tablet (15 mg total) by mouth daily.   rosuvastatin 20 MG tablet Commonly known as: CRESTOR Take 1 tablet (20 mg total) by mouth daily.   [name] 200 UNIT/ML FlexTouch Pen Generic drug: insulin degludec Inject 100 Units into the skin daily.   Unifine Pentips 32G X 6 MM Misc Generic drug: Insulin Pen Needle Inject into the skin 4 (four) times daily.        Allergies:  Allergies  Allergen Reactions   Metformin And Related Diarrhea   Ramipril     cough    Past Medical History:  Diagnosis Date   Diabetes mellitus    Hyperlipidemia    Hypertension     No past  surgical history on file.  Family History  Problem Relation Age of Onset   Heart disease Father    Heart attack Father 57   Heart disease Brother    Heart attack Brother 55   Diabetes Mother    Heart attack Mother    Breast cancer Cousin    Heart attack Paternal Uncle    Heart attack Paternal Uncle    Stroke Paternal Uncle    Heart attack Paternal Uncle 40       5 HA in the past    Colon  cancer Neg Hx    Stomach cancer Neg Hx    Esophageal cancer Neg Hx     Social History:  reports that he has never smoked. He has never used smokeless tobacco. He reports current alcohol use of about 2.0 standard drinks of alcohol per week. He reports that he does not use drugs.  Review of Systems:  Last diabetic eye exam date 3/23  Last foot exam date: 10/22  Symptoms of neuropathy: None  Urine microalbumin: Normal as of 8/23 No history of renal dysfunction  Lab Results  Component Value Date   CREATININE 1.54 (H) 01/03/2023   CREATININE 0.96 07/26/2022   CREATININE 0.96 05/04/2022    Hypertension:   Treatment includes Olmesartan 20 mg daily  BP Readings from Last 3 Encounters:  01/06/23 134/88  07/28/22 122/70  05/07/22 124/84    Lipid management:    He has had high triglycerides, over 7000 at the highest level  He has been prescribed Crestor 20 mg, Vascepa and fenofibrate by his PCP Had been on Vascepa since 3/22 Even though he thinks he is taking his Vascepa and fenofibrate pharmacy data from epic indicates he has not had these prescriptions filled Also takes cod liver oil and fish oil Triglycerides are over 3000   Lab Results  Component Value Date   CHOL 421 (H) 01/03/2023   CHOL 297 (H) 05/04/2022   CHOL 240 (H) 12/28/2021   Lab Results  Component Value Date   HDL 26.50 (L) 01/03/2023   HDL 32.50 (L) 05/04/2022   HDL 19 (L) 12/28/2021   Lab Results  Component Value Date   LDLCALC Comment (A) 12/28/2021   [place] 115 (H) 06/23/2021   [place]  12/11/2020      Comment:     . LDL cholesterol not calculated. Triglyceride levels greater than 400 mg/dL invalidate calculated LDL results. . Reference range: <100 . Desirable range <100 mg/dL for primary prevention;   <70 mg/dL for patients with CHD or diabetic patients  with > or = 2 CHD risk factors. Marland Kitchen LDL-C is now calculated using the Martin-Hopkins  calculation, which is a validated novel method providing  better accuracy than the Friedewald equation in the  estimation of LDL-C.  Cresenciano Genre et al. Annamaria Helling. [id number]): 2061-2068  (http://education.[facility].com/faq/FAQ164)    Lab Results  Component Value Date   TRIG (H) 01/03/2023    3933.0 Triglyceride is over 400; calculations on Lipids are invalid.   TRIG (H) 05/04/2022    1780.0 Triglyceride is over 400; calculations on Lipids are invalid.   TRIG 1,563 (HH) 12/28/2021   Lab Results  Component Value Date   CHOLHDL 16 01/03/2023   CHOLHDL 9 05/04/2022   CHOLHDL 48.9 (H) 12/11/2020   Lab Results  Component Value Date   LDLDIRECT 70.0 01/03/2023   LDLDIRECT 54.0 05/04/2022   LDLDIRECT 51 12/28/2021     Examination:   BP 134/88   Pulse 93   Ht 5\' 8"  (1.727 m)   Wt 203 lb (92.1 kg)   SpO2 96%   BMI 30.87 kg/m   Body mass index is 30.87 kg/m.    ASSESSMENT/ PLAN:    Diabetes type 2:   Current regimen Humalog 10 units twice daily, Actos and no basal  See history of present illness for detailed discussion of current diabetes management, blood sugar patterns and problems identified  A1c is 10 compared to 8.7  Blood glucose control is much worse again from not taking any basal insulin and also continued  insulin deficiency and insulin resistance He also has not taken his Wilder Glade or monitored his blood sugar  Plan: Restart Farxiga 10 mg Restart TRESIBA Discussed how basal insulin works, timing of injection, dosage, injection sites.  Also discussed titration based on fasting blood sugar every 3 days by 2 units  and at target of 90-130 for fasting Wilson.  He will adjust the dose weekly to get the morning sugars averaging in the 120-130, initial dose will be 100 units daily Will try to get him the Dexcom sensor if covered Also test trips prescribed  MIXED hyperlipidemia: This is uncontrolled likely to be from increased hyperglycemia, also can cut back on carbohydrates  Although he says that he is taking all his medications regularly the pharmacy refills do not indicate any recent refills New prescriptions have been sent for his Vascepa and fenofibrate  Needs fasting lipids on next visit  There are no Patient Instructions on file for this visit.  Total visit time for evaluation and management of above problems, counseling = 30 minutes   Elayne Snare 01/06/2023, 11:07 AM

## 2023-01-07 ENCOUNTER — Other Ambulatory Visit (HOSPITAL_BASED_OUTPATIENT_CLINIC_OR_DEPARTMENT_OTHER): Payer: Self-pay

## 2023-01-10 ENCOUNTER — Other Ambulatory Visit (HOSPITAL_BASED_OUTPATIENT_CLINIC_OR_DEPARTMENT_OTHER): Payer: Self-pay

## 2023-01-11 ENCOUNTER — Other Ambulatory Visit (HOSPITAL_BASED_OUTPATIENT_CLINIC_OR_DEPARTMENT_OTHER): Payer: Self-pay

## 2023-01-12 ENCOUNTER — Other Ambulatory Visit (HOSPITAL_BASED_OUTPATIENT_CLINIC_OR_DEPARTMENT_OTHER): Payer: Self-pay

## 2023-01-13 ENCOUNTER — Other Ambulatory Visit (HOSPITAL_BASED_OUTPATIENT_CLINIC_OR_DEPARTMENT_OTHER): Payer: Self-pay

## 2023-01-17 IMAGING — MR MR HEEL *L* W/O CM
6 series · 40 of 40 positions shown · non-contrast
Comparison: Left foot radiograph 04/23/2021

CLINICAL DATA: Achilles tendon trauma or laceration

EXAM:
MR OF THE LEFT HEEL WITHOUT CONTRAST
TECHNIQUE: Multiplanar, multisequence MR imaging of the left heel was
performed. No intravenous contrast was administered.

[Series 4: T2 fat-sat · axial · 3.0mm · 0.50mm/px · z∈[-130,-9]mm · 8 of 32 slices shown (1 of 2)]
[im 1/32]
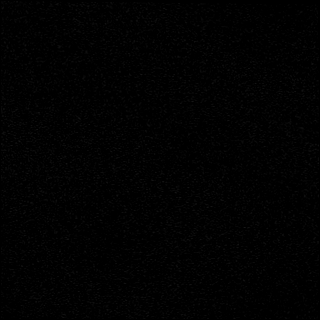
[im 5/32]
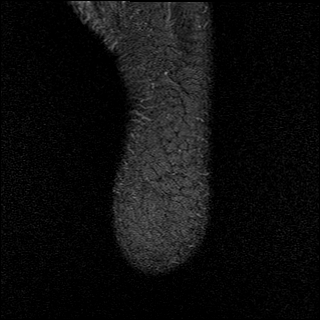
[im 9/32]
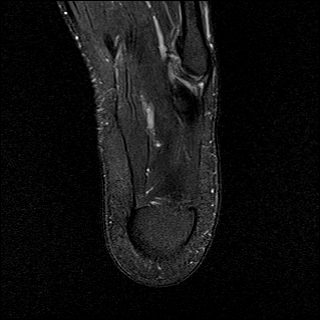
[im 14/32]
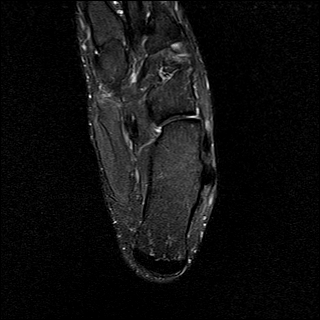
[im 18/32]
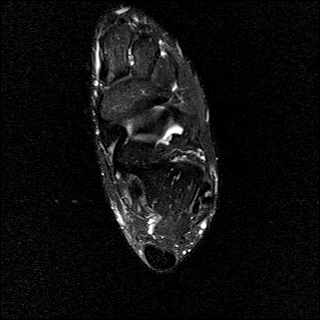
[im 23/32]
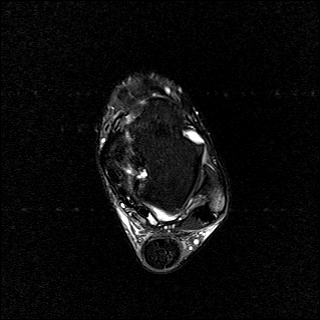
[im 27/32]
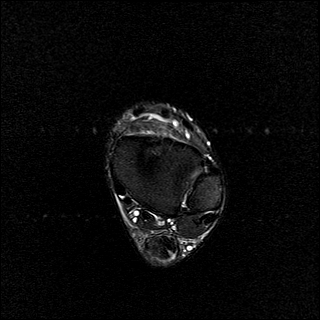
[im 32/32]
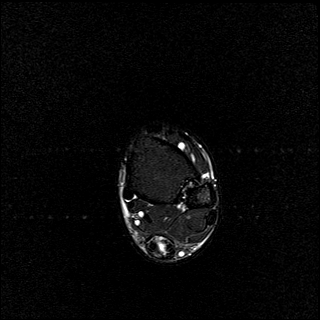

[Series 5: PD fat-sat · axial · 3.0mm · 0.50mm/px · z∈[-130,-9]mm · 8 of 31 slices shown]
[im 1/31]
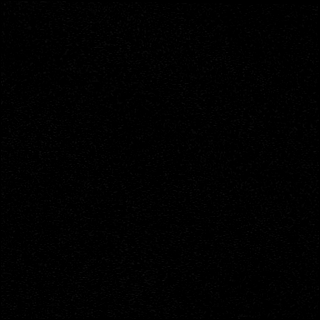
[im 5/31]
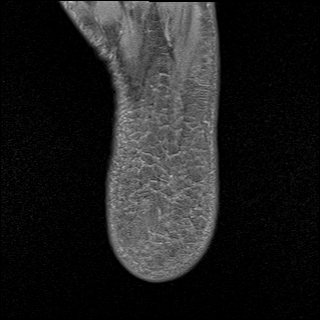
[im 9/31]
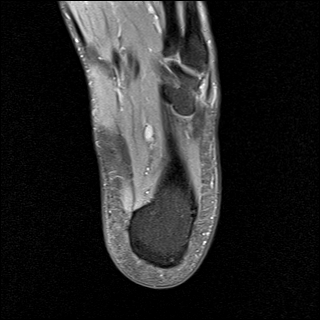
[im 13/31]
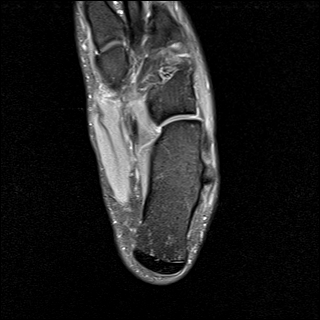
[im 18/31]
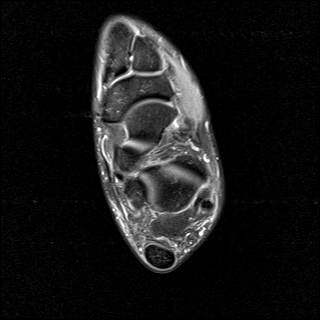
[im 22/31]
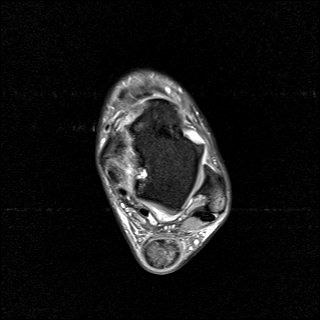
[im 26/31]
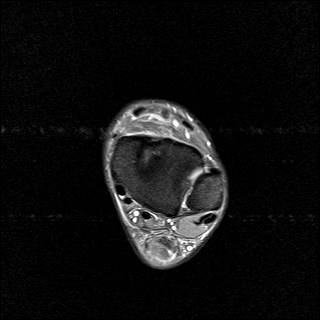
[im 31/31]
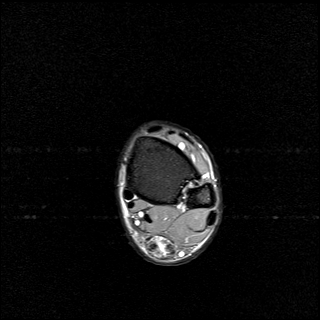

[Series 6: T1 · axial · 3.0mm · 0.50mm/px · z∈[-131,-10]mm · 8 of 32 slices shown (1 of 2)]
[im 1/32]
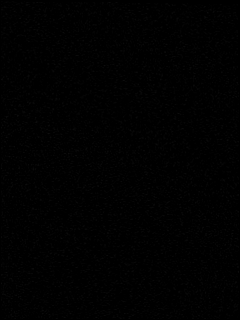
[im 5/32]
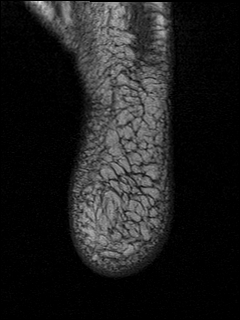
[im 9/32]
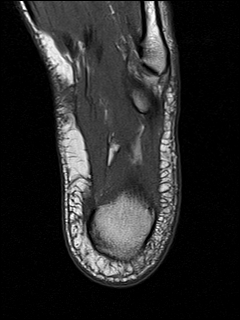
[im 14/32]
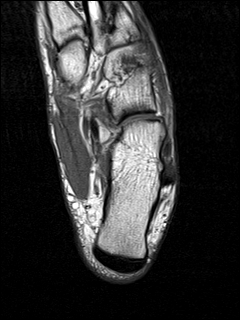
[im 18/32]
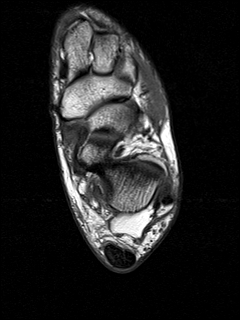
[im 23/32]
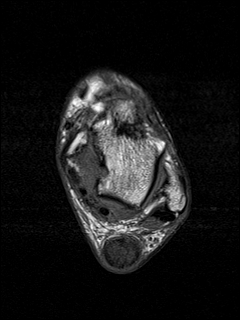
[im 27/32]
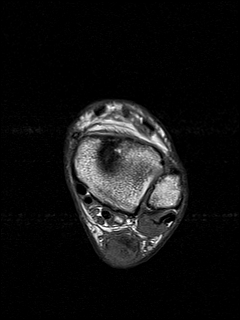
[im 32/32]
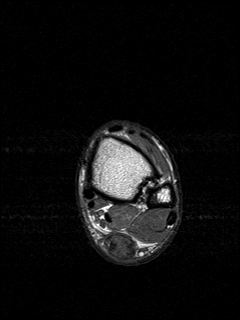

[Series 7: T1 · sagittal · 4.0mm · 0.56mm/px · 4 of 17 slices shown (2 of 2)]
[im 1/17]
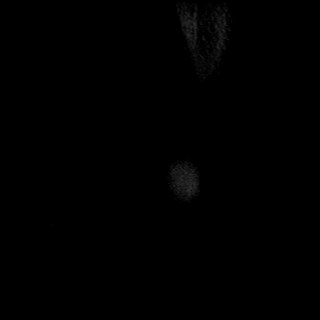
[im 6/17]
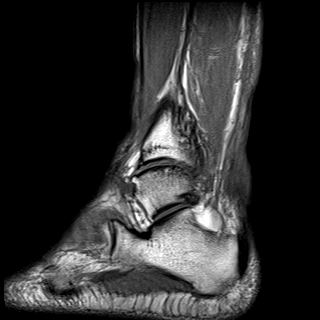
[im 11/17]
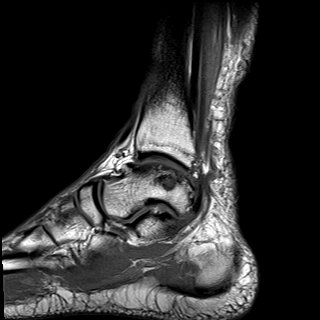
[im 17/17]
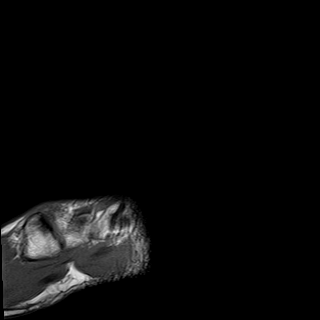

[Series 8: STIR · sagittal · 4.0mm · 0.35mm/px · 4 of 17 slices shown]
[im 1/17]
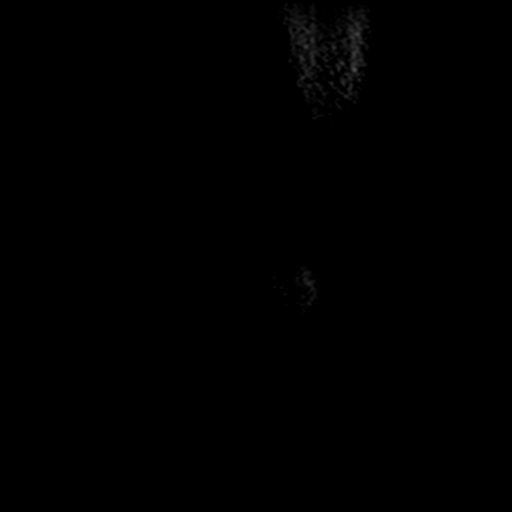
[im 6/17]
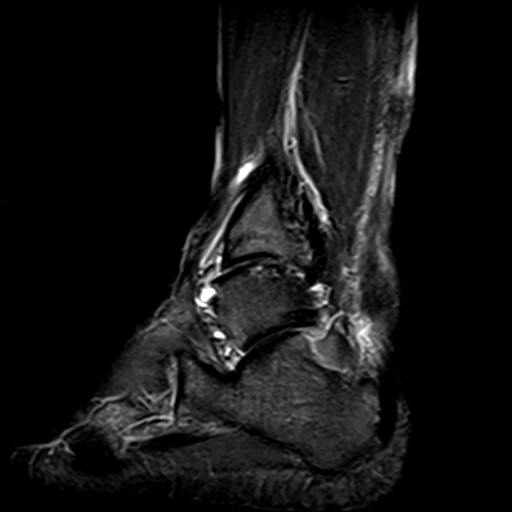
[im 11/17]
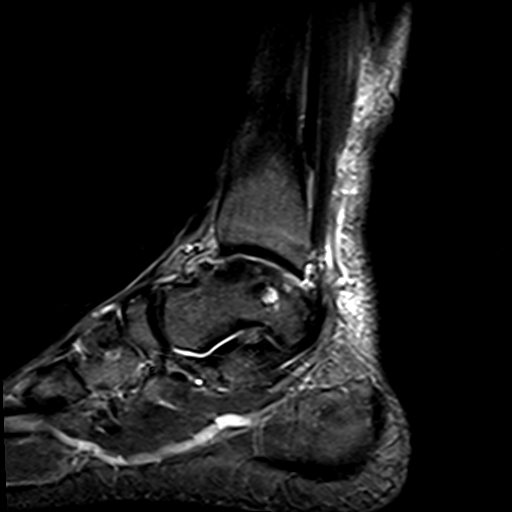
[im 17/17]
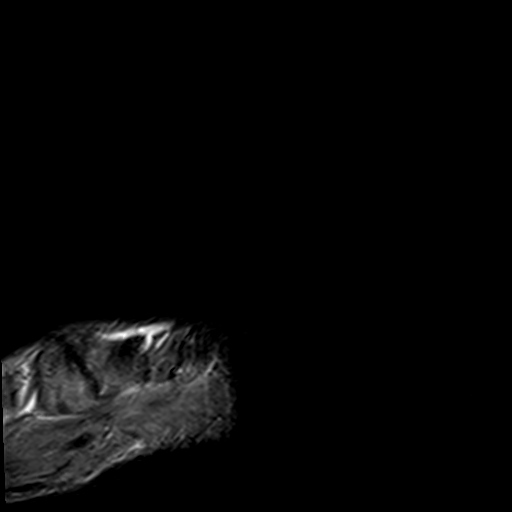

[Series 9: T2 fat-sat · coronal · 3.0mm · 0.50mm/px · 8 of 32 slices shown (2 of 2)]
[im 1/32]
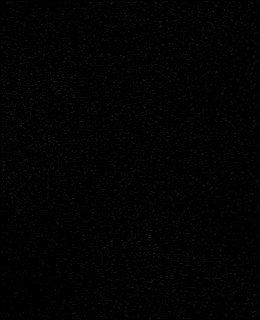
[im 5/32]
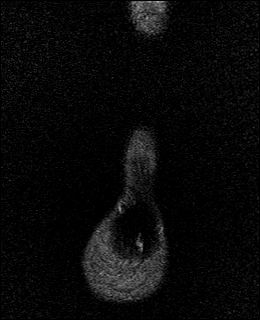
[im 9/32]
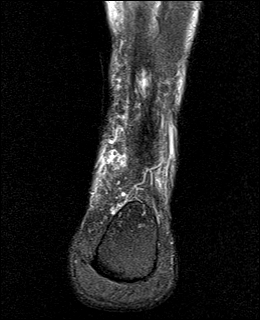
[im 14/32]
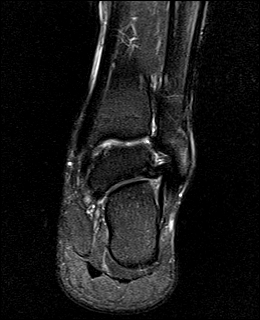
[im 18/32]
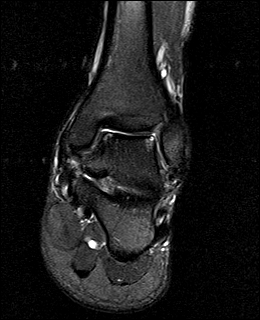
[im 23/32]
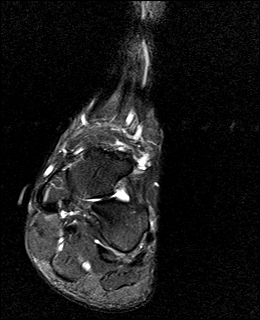
[im 27/32]
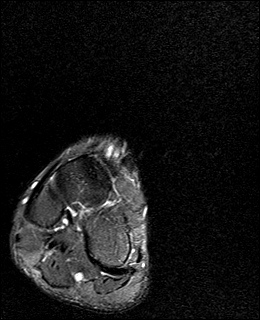
[im 32/32]
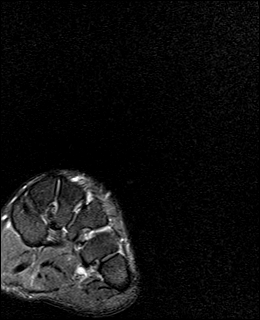

[40 of 40 positions shown; findings below may reference images not displayed]

FINDINGS: TENDONS

Peroneal: Peroneal longus and brevis tendons are intact. There is an
accessory peroneal tendon inserting on the lateral calcaneus, likely
peroneus quartus.

Posteromedial: Posterior tibial tendon intact. Flexor digitorum
longus tendon intact. Flexor hallucis longus tendon intact.

Anterior: Tibialis anterior tendon intact. Extensor hallucis longus
tendon intact Extensor digitorum longus tendon intact.

Achilles: There is a full-thickness Achilles tendon rupture centered
approximately 7.0 cm above the calcaneal insertion. The tendon gap
measures approximately 4.0 cm. There is only mild edema along the
rupture.

Plantar Fascia: Intact.

LIGAMENTS

Lateral: Laxity of the ATFL with adjacent scarring, consistent with
chronic sprain. Calcaneofibular ligament is likely intact. Posterior
talofibular ligament intact. Anterior and posterior tibiofibular
ligaments intact.

Medial: Deltoid ligament intact. Spring ligament intact.

CARTILAGE

Ankle Joint: Small tibiotalar joint effusion. There is
moderate-severe tibiotalar osteoarthritis.

Subtalar Joints/Sinus Tarsi: Minimal subtalar arthritis no subtalar
joint effusion. Normal sinus tarsi.

Bones: No acute fracture or dislocation.

Soft Tissue: No fluid collection or hematoma. Muscles are normal
without edema or atrophy. Tarsal tunnel is normal.
IMPRESSION: Full-thickness Achilles tendon rupture centered approximately 7.0 cm
above the calcaneal insertion. Tendon gap measures up to 4.0 cm.
Appearance suggests a subacute injury.

Chronic ATFL sprain.

Moderate-severe tibiotalar osteoarthritis.

## 2023-01-18 ENCOUNTER — Other Ambulatory Visit (HOSPITAL_BASED_OUTPATIENT_CLINIC_OR_DEPARTMENT_OTHER): Payer: Self-pay

## 2023-01-18 ENCOUNTER — Telehealth: Payer: Self-pay

## 2023-01-18 NOTE — Telephone Encounter (Signed)
Patient Advocate Encounter   Received notification from pt msgs that prior authorization is required for Marcelline Deist  Submitted: 01/18/23 Key [id number]  Status is pending

## 2023-01-18 NOTE — Telephone Encounter (Signed)
Pharmacy Patient Advocate Encounter  Prior Authorization for Marcelline Deist has been approved by [facility] (ins).    PA # [id number] Effective dates: 12/19/22 through 01/18/24

## 2023-01-18 NOTE — Telephone Encounter (Signed)
Inbound fax: recently prescribed Farxiga and its not covered by pt plan. Apart of step therapy and pt needs to be on Metformin. Prior auth required.

## 2023-01-24 ENCOUNTER — Other Ambulatory Visit (HOSPITAL_BASED_OUTPATIENT_CLINIC_OR_DEPARTMENT_OTHER): Payer: Self-pay

## 2023-02-10 ENCOUNTER — Telehealth: Payer: Self-pay | Admitting: Endocrinology

## 2023-02-10 ENCOUNTER — Telehealth: Payer: Self-pay | Admitting: Internal Medicine

## 2023-02-10 ENCOUNTER — Other Ambulatory Visit: Payer: Self-pay

## 2023-02-10 DIAGNOSIS — E139 Other specified diabetes mellitus without complications: Secondary | ICD-10-CM

## 2023-02-10 DIAGNOSIS — E781 Pure hyperglyceridemia: Secondary | ICD-10-CM

## 2023-02-10 MED ORDER — TRESIBA FLEXTOUCH 200 UNIT/ML ~~LOC~~ SOPN: 100.0000 [IU] | PEN_INJECTOR | Freq: Every day | SUBCUTANEOUS | 1 refills | Status: DC

## 2023-02-10 MED ORDER — DEXCOM G7 SENSOR MISC: 1.0000 | 1 refills | Status: DC

## 2023-02-10 MED ORDER — PIOGLITAZONE HCL 15 MG PO TABS: 15.0000 mg | ORAL_TABLET | Freq: Every day | ORAL | 1 refills | Status: DC

## 2023-02-10 MED ORDER — DAPAGLIFLOZIN PROPANEDIOL 10 MG PO TABS: 10.0000 mg | ORAL_TABLET | Freq: Every day | ORAL | 1 refills | Status: DC

## 2023-02-10 MED ORDER — INSULIN LISPRO (1 UNIT DIAL) 100 UNIT/ML (KWIKPEN)
PEN_INJECTOR | SUBCUTANEOUS | 2 refills | Status: DC
Start: 2023-02-10 — End: 2023-07-29

## 2023-02-10 MED ORDER — FENOFIBRATE 145 MG PO TABS
145.0000 mg | ORAL_TABLET | Freq: Every day | ORAL | 1 refills | Status: DC
Start: 2023-02-10 — End: 2023-04-28

## 2023-02-10 NOTE — Telephone Encounter (Signed)
MEDICATION: Evaristo Bury FlexTouch insulin degludec (TRESIBA FLEXTOUCH) 200 UNIT/ML FlexTouch Pen  PHARMACY:  [facility]  HAS THE PATIENT CONTACTED THEIR PHARMACY?  Yes  IS THIS A 90 DAY SUPPLY : Yes  IS PATIENT OUT OF MEDICATION: Yes  IF NOT; HOW MUCH IS LEFT:   LAST APPOINTMENT DATE: @4 /4/24  NEXT APPOINTMENT DATE:@6 /01/2023  DO WE HAVE YOUR PERMISSION TO LEAVE A DETAILED MESSAGE?:  Yes  OTHER COMMENT:  Patient ran out of medication today. Patient states that [facility] was charging $500+.   **Let patient know to contact pharmacy at the end of the day to make sure medication is ready. **  ** Please notify patient to allow 48-72 hours to process**  **Encourage patient to contact the pharmacy for refills or they can request refills through [facility]**

## 2023-02-10 NOTE — Telephone Encounter (Signed)
[facility]  

## 2023-02-10 NOTE — Telephone Encounter (Signed)
Error

## 2023-02-16 ENCOUNTER — Other Ambulatory Visit: Payer: Self-pay

## 2023-02-16 MED ORDER — TRESIBA FLEXTOUCH 200 UNIT/ML ~~LOC~~ SOPN: 100.0000 [IU] | PEN_INJECTOR | Freq: Every day | SUBCUTANEOUS | 1 refills | Status: DC

## 2023-03-04 ENCOUNTER — Other Ambulatory Visit (INDEPENDENT_AMBULATORY_CARE_PROVIDER_SITE_OTHER): Payer: Managed Care, Other (non HMO)

## 2023-03-04 DIAGNOSIS — E1165 Type 2 diabetes mellitus with hyperglycemia: Secondary | ICD-10-CM

## 2023-03-04 DIAGNOSIS — E781 Pure hyperglyceridemia: Secondary | ICD-10-CM

## 2023-03-04 DIAGNOSIS — Z794 Long term (current) use of insulin: Secondary | ICD-10-CM

## 2023-03-04 LAB — LIPID PANEL
Cholesterol: 249 mg/dL — ABNORMAL HIGH (ref 0–200)
HDL: 29.6 mg/dL — ABNORMAL LOW (ref >39.00)
Total CHOL/HDL Ratio: 8
Triglycerides: 843 mg/dL — ABNORMAL HIGH (ref 0.0–149.0)

## 2023-03-04 LAB — COMPREHENSIVE METABOLIC PANEL
ALT: 21 U/L (ref 0–53)
AST: 22 U/L (ref 0–37)
Albumin: 4.6 g/dL (ref 3.5–5.2)
Alkaline Phosphatase: 35 U/L — ABNORMAL LOW (ref 39–117)
BUN: 16 mg/dL (ref 6–23)
CO2: 27 mEq/L (ref 19–32)
Calcium: 10 mg/dL (ref 8.4–10.5)
Chloride: 102 mEq/L (ref 96–112)
Creatinine, Ser: 1.12 mg/dL (ref 0.40–1.50)
GFR: 73.08 mL/min (ref >60.00)
Glucose, Bld: 178 mg/dL — ABNORMAL HIGH (ref 70–99)
Potassium: 4.1 mEq/L (ref 3.5–5.1)
Sodium: 137 mEq/L (ref 135–145)
Total Bilirubin: 0.4 mg/dL (ref 0.2–1.2)
Total Protein: 8.4 g/dL — ABNORMAL HIGH (ref 6.0–8.3)

## 2023-03-04 LAB — LDL CHOLESTEROL, DIRECT: Direct LDL: 76 mg/dL

## 2023-03-05 LAB — FRUCTOSAMINE: Fructosamine: 359 umol/L — ABNORMAL HIGH (ref 0–285)

## 2023-03-08 ENCOUNTER — Encounter: Payer: Managed Care, Other (non HMO) | Admitting: Endocrinology

## 2023-03-08 ENCOUNTER — Other Ambulatory Visit: Payer: Self-pay | Admitting: Internal Medicine

## 2023-03-08 ENCOUNTER — Other Ambulatory Visit (HOSPITAL_BASED_OUTPATIENT_CLINIC_OR_DEPARTMENT_OTHER): Payer: Self-pay

## 2023-03-08 ENCOUNTER — Other Ambulatory Visit: Payer: Self-pay | Admitting: Endocrinology

## 2023-03-08 ENCOUNTER — Other Ambulatory Visit: Payer: Self-pay | Admitting: Student

## 2023-03-08 ENCOUNTER — Other Ambulatory Visit: Payer: Self-pay

## 2023-03-08 ENCOUNTER — Encounter: Payer: Self-pay | Admitting: Endocrinology

## 2023-03-08 VITALS — BP 124/80 | HR 88 | Ht 68.0 in | Wt 205.8 lb

## 2023-03-08 DIAGNOSIS — E781 Pure hyperglyceridemia: Secondary | ICD-10-CM

## 2023-03-08 NOTE — Progress Notes (Unsigned)
This encounter was created in error - please disregard.

## 2023-04-11 ENCOUNTER — Encounter: Payer: Self-pay | Admitting: Internal Medicine

## 2023-04-11 ENCOUNTER — Other Ambulatory Visit (HOSPITAL_BASED_OUTPATIENT_CLINIC_OR_DEPARTMENT_OTHER): Payer: Self-pay

## 2023-04-11 ENCOUNTER — Ambulatory Visit: Payer: Managed Care, Other (non HMO) | Admitting: Internal Medicine

## 2023-04-11 VITALS — BP 170/98 | HR 85 | Temp 98.9°F | Resp 16 | Ht 68.0 in | Wt 204.0 lb

## 2023-04-11 DIAGNOSIS — R800 Isolated proteinuria: Secondary | ICD-10-CM

## 2023-04-11 DIAGNOSIS — E781 Pure hyperglyceridemia: Secondary | ICD-10-CM

## 2023-04-11 DIAGNOSIS — I10 Essential (primary) hypertension: Secondary | ICD-10-CM

## 2023-04-11 DIAGNOSIS — E785 Hyperlipidemia, unspecified: Secondary | ICD-10-CM

## 2023-04-11 DIAGNOSIS — E139 Other specified diabetes mellitus without complications: Secondary | ICD-10-CM | POA: Diagnosis not present

## 2023-04-11 LAB — URINALYSIS, ROUTINE W REFLEX MICROSCOPIC
Bilirubin Urine: NEGATIVE
Ketones, ur: NEGATIVE
Leukocytes,Ua: NEGATIVE
Nitrite: NEGATIVE
Specific Gravity, Urine: >=1.03 — AB (ref 1.000–1.030)
Total Protein, Urine: 100 — AB
Urine Glucose: NEGATIVE
Urobilinogen, UA: 0.2 (ref 0.0–1.0)
WBC, UA: NONE SEEN (ref 0–?)
pH: 5.5 (ref 5.0–8.0)

## 2023-04-11 LAB — CBC WITH DIFFERENTIAL/PLATELET
Basophils Absolute: 0.1 10*3/uL (ref 0.0–0.1)
Basophils Relative: 1 % (ref 0.0–3.0)
Eosinophils Absolute: 0.2 10*3/uL (ref 0.0–0.7)
Eosinophils Relative: 2.4 % (ref 0.0–5.0)
HCT: 44.4 % (ref 39.0–52.0)
Hemoglobin: 14.6 g/dL (ref 13.0–17.0)
Lymphocytes Relative: 45.4 % (ref 12.0–46.0)
Lymphs Abs: 3.3 10*3/uL (ref 0.7–4.0)
MCHC: 33 g/dL (ref 30.0–36.0)
MCV: 80 fl (ref 78.0–100.0)
Monocytes Absolute: 0.5 10*3/uL (ref 0.1–1.0)
Monocytes Relative: 6.4 % (ref 3.0–12.0)
Neutro Abs: 3.2 10*3/uL (ref 1.4–7.7)
Neutrophils Relative %: 44.8 % (ref 43.0–77.0)
Platelets: 304 10*3/uL (ref 150.0–400.0)
RBC: 5.55 Mil/uL (ref 4.22–5.81)
RDW: 13.9 % (ref 11.5–15.5)
WBC: 7.2 10*3/uL (ref 4.0–10.5)

## 2023-04-11 LAB — TRIGLYCERIDES: Triglycerides: 1406 mg/dL — ABNORMAL HIGH (ref 0.0–149.0)

## 2023-04-11 LAB — BASIC METABOLIC PANEL
BUN: 16 mg/dL (ref 6–23)
CO2: 25 mEq/L (ref 19–32)
Calcium: 10.3 mg/dL (ref 8.4–10.5)
Chloride: 100 mEq/L (ref 96–112)
Creatinine, Ser: 0.99 mg/dL (ref 0.40–1.50)
GFR: 84.68 mL/min (ref >60.00)
Glucose, Bld: 209 mg/dL — ABNORMAL HIGH (ref 70–99)
Potassium: 4.1 mEq/L (ref 3.5–5.1)
Sodium: 135 mEq/L (ref 135–145)

## 2023-04-11 LAB — MICROALBUMIN / CREATININE URINE RATIO
Creatinine,U: 154 mg/dL
Microalb Creat Ratio: 74.6 mg/g — ABNORMAL HIGH (ref 0.0–30.0)
Microalb, Ur: 114.8 mg/dL — ABNORMAL HIGH (ref 0.0–1.9)

## 2023-04-11 LAB — HEMOGLOBIN A1C: Hgb A1c MFr Bld: 10.2 % — ABNORMAL HIGH (ref 4.6–6.5)

## 2023-04-11 MED ORDER — TECHLITE PEN NEEDLES 32G X 6 MM MISC
1.0000 | Freq: Four times a day (QID) | 1 refills | Status: AC
Start: 2023-04-11 — End: ?
  Filled 2023-04-11: qty 300, 75d supply, fill #0

## 2023-04-11 NOTE — Patient Instructions (Signed)
Hypertension, Adult High blood pressure (hypertension) is when the force of blood pumping through the arteries is too strong. The arteries are the blood vessels that carry blood from the heart throughout the body. Hypertension forces the heart to work harder to pump blood and may cause arteries to become narrow or stiff. Untreated or uncontrolled hypertension can lead to a heart attack, heart failure, a stroke, kidney disease, and other problems. A blood pressure reading consists of a higher number over a lower number. Ideally, your blood pressure should be below 120/80. The first ("top") number is called the systolic pressure. It is a measure of the pressure in your arteries as your heart beats. The second ("bottom") number is called the diastolic pressure. It is a measure of the pressure in your arteries as the heart relaxes. What are the causes? The exact cause of this condition is not known. There are some conditions that result in high blood pressure. What increases the risk? Certain factors may make you more likely to develop high blood pressure. Some of these risk factors are under your control, including: Smoking. Not getting enough exercise or physical activity. Being overweight. Having too much fat, sugar, calories, or salt (sodium) in your diet. Drinking too much alcohol. Other risk factors include: Having a personal history of heart disease, diabetes, high cholesterol, or kidney disease. Stress. Having a family history of high blood pressure and high cholesterol. Having obstructive sleep apnea. Age. The risk increases with age. What are the signs or symptoms? High blood pressure may not cause symptoms. Very high blood pressure (hypertensive crisis) may cause: Headache. Fast or irregular heartbeats (palpitations). Shortness of breath. Nosebleed. Nausea and vomiting. Vision changes. Severe chest pain, dizziness, and seizures. How is this diagnosed? This condition is diagnosed by  measuring your blood pressure while you are seated, with your arm resting on a flat surface, your legs uncrossed, and your feet flat on the floor. The cuff of the blood pressure monitor will be placed directly against the skin of your upper arm at the level of your heart. Blood pressure should be measured at least twice using the same arm. Certain conditions can cause a difference in blood pressure between your right and left arms. If you have a high blood pressure reading during one visit or you have normal blood pressure with other risk factors, you may be asked to: Return on a different day to have your blood pressure checked again. Monitor your blood pressure at home for 1 week or longer. If you are diagnosed with hypertension, you may have other blood or imaging tests to help your health care provider understand your overall risk for other conditions. How is this treated? This condition is treated by making healthy lifestyle changes, such as eating healthy foods, exercising more, and reducing your alcohol intake. You may be referred for counseling on a healthy diet and physical activity. Your health care provider may prescribe medicine if lifestyle changes are not enough to get your blood pressure under control and if: Your systolic blood pressure is above 130. Your diastolic blood pressure is above 80. Your personal target blood pressure may vary depending on your medical conditions, your age, and other factors. Follow these instructions at home: Eating and drinking  Eat a diet that is high in fiber and potassium, and low in sodium, added sugar, and fat. An example of this eating plan is called the DASH diet. DASH stands for Dietary Approaches to Stop Hypertension. To eat this way: Eat   plenty of fresh fruits and vegetables. Try to fill one half of your plate at each meal with fruits and vegetables. Eat whole grains, such as whole-wheat pasta, brown rice, or whole-grain bread. Fill about one  fourth of your plate with whole grains. Eat or drink low-fat dairy products, such as skim milk or low-fat yogurt. Avoid fatty cuts of meat, processed or cured meats, and poultry with skin. Fill about one fourth of your plate with lean proteins, such as fish, chicken without skin, beans, eggs, or tofu. Avoid pre-made and processed foods. These tend to be higher in sodium, added sugar, and fat. Reduce your daily sodium intake. Many people with hypertension should eat less than 1,500 mg of sodium a day. Do not drink alcohol if: Your health care provider tells you not to drink. You are pregnant, may be pregnant, or are planning to become pregnant. If you drink alcohol: Limit how much you have to: 0-1 drink a day for women. 0-2 drinks a day for men. Know how much alcohol is in your drink. In the [place] one drink equals one 12 oz bottle of beer (355 mL), one 5 oz glass of wine (148 mL), or one 1 oz glass of hard liquor (44 mL). Lifestyle  Work with your health care provider to maintain a healthy body weight or to lose weight. Ask what an ideal weight is for you. Get at least 30 minutes of exercise that causes your heart to beat faster (aerobic exercise) most days of the week. Activities may include walking, swimming, or biking. Include exercise to strengthen your muscles (resistance exercise), such as Pilates or lifting weights, as part of your weekly exercise routine. Try to do these types of exercises for 30 minutes at least 3 days a week. Do not use any products that contain nicotine or tobacco. These products include cigarettes, chewing tobacco, and vaping devices, such as e-cigarettes. If you need help quitting, ask your health care provider. Monitor your blood pressure at home as told by your health care provider. Keep all follow-up visits. This is important. Medicines Take over-the-counter and prescription medicines only as told by your health care provider. Follow directions carefully. Blood  pressure medicines must be taken as prescribed. Do not skip doses of blood pressure medicine. Doing this puts you at risk for problems and can make the medicine less effective. Ask your health care provider about side effects or reactions to medicines that you should watch for. Contact a health care provider if you: Think you are having a reaction to a medicine you are taking. Have headaches that keep coming back (recurring). Feel dizzy. Have swelling in your ankles. Have trouble with your vision. Get help right away if you: Develop a severe headache or confusion. Have unusual weakness or numbness. Feel faint. Have severe pain in your chest or abdomen. Vomit repeatedly. Have trouble breathing. These symptoms may be an emergency. Get help right away. Call 911. Do not wait to see if the symptoms will go away. Do not drive yourself to the hospital. Summary Hypertension is when the force of blood pumping through your arteries is too strong. If this condition is not controlled, it may put you at risk for serious complications. Your personal target blood pressure may vary depending on your medical conditions, your age, and other factors. For most people, a normal blood pressure is less than 120/80. Hypertension is treated with lifestyle changes, medicines, or a combination of both. Lifestyle changes include losing weight, eating a healthy,   low-sodium diet, exercising more, and limiting alcohol. This information is not intended to replace advice given to you by your health care provider. Make sure you discuss any questions you have with your health care provider. Document Revised: 07/28/2021 Document Reviewed: 07/28/2021 Elsevier Patient Education  2024 [employer]  

## 2023-04-11 NOTE — Progress Notes (Unsigned)
Subjective:  Patient ID: Tim Wilson, male    DOB: 1966-04-14  Age: 57 y.o. MRN: [id number]  CC: Hyperlipidemia, Hypertension, and Diabetes   HPI Tim Wilson presents for f/up ----  Discussed the use of AI scribe software for clinical note transcription with the patient, who gave verbal consent to proceed.  History of Present Illness   The patient, with a history of diabetes, hypertension, and hypertriglyceridemia, presents for a follow-up visit. They report feeling well overall, despite occasional dietary indiscretions. The primary concern is persistently elevated triglycerides, which have previously reached into the thousands. The lowest recorded level was in the low thousands, with one instance of a decrease to 800. They deny any symptoms of high triglycerides such as abdominal pain or nausea and vomiting. However, they recall a pancreatitis episode approximately five years ago, which they attribute to consuming beef after a long period of abstention.  The patient admits to occasional consumption of pizza and fried chicken, which they believe may contribute to their elevated triglycerides. They are currently on fenofibrate but are not taking vascepa due to the cost. They deny any muscle or joint aches from the fenofibrate.  Regarding their hypertension, they deny any symptoms such as blurred vision, chest pain, shortness of breath, or lower extremity swelling.  In terms of their diabetes management, they have been using Humalog insulin with each meal but have stopped using Tresiba, a long-acting insulin, due to cost concerns.       Outpatient Medications Prior to Visit  Medication Sig Dispense Refill   Accu-Chek Softclix Lancets lancets Use to check blood sugar twice a day 200 each 3   aspirin EC 325 MG tablet Take by mouth.     Cholecalciferol 1.25 MG (50000 UT) capsule Take 1 capsule (50,000 Units total) by mouth once a week. 12 capsule 0   Continuous Blood Gluc Sensor  (FREESTYLE LIBRE 2 SENSOR) MISC Use to check blood sugar and change every 14 days 6 each 3   Continuous Glucose Sensor (DEXCOM G7 SENSOR) MISC Use as directed and change sensor every 10 days. 9 each 1   dapagliflozin propanediol (FARXIGA) 10 MG TABS tablet Take 1 tablet (10 mg total) by mouth daily before breakfast. 91 tablet 1   fenofibrate (TRICOR) 145 MG tablet Take 1 tablet (145 mg total) by mouth daily. 90 tablet 1   glucose blood (ACCU-CHEK GUIDE) test strip Use to check blood sugar 2 (two) times daily. 200 each 3   insulin degludec (TRESIBA FLEXTOUCH) 200 UNIT/ML FlexTouch Pen Inject 100 Units into the skin daily. 18 mL 1   insulin lispro (HUMALOG KWIKPEN) 100 UNIT/ML KwikPen INJECT 10 UNITS DAILY WITH BREAKFAST 15 mL 2   olmesartan (BENICAR) 20 MG tablet Take 1 tablet (20 mg total) by mouth daily. 90 tablet 0   pioglitazone (ACTOS) 15 MG tablet Take 1 tablet (15 mg total) by mouth daily. 90 tablet 1   rosuvastatin (CRESTOR) 20 MG tablet Take 1 tablet (20 mg total) by mouth daily. 90 tablet 0   albuterol (VENTOLIN HFA) 108 (90 Base) MCG/ACT inhaler Inhale into the lungs.     COVID-19 mRNA vaccine 2023-2024 (COMIRNATY) syringe Inject into the muscle. 0.3 mL 0   icosapent Ethyl (VASCEPA) 1 g capsule Take 2 capsules (2 g total) by mouth 2 (two) times daily. 360 capsule 1   influenza vac split quadrivalent PF (FLUARIX QUADRIVALENT) 0.5 ML injection Inject into the muscle. 0.5 mL 0   Insulin Pen Needle (TECHLITE PEN  NEEDLES) 32G X 6 MM MISC Inject into the skin 4 (four) times daily. 300 each 1   metoprolol succinate (TOPROL-XL) 25 MG 24 hr tablet Take one-half tablet (12.5 mg total) by mouth daily. Take with or immediately following a meal. (Patient not taking: Reported on 01/06/2023) 45 tablet 3   No facility-administered medications prior to visit.    ROS Review of Systems  Objective:  BP (!) 170/98 (BP Location: Left Arm, Patient Position: Sitting, Cuff Size: Large)   Pulse 85   Temp  98.9 F (37.2 C) (Oral)   Resp 16   Ht 5\' 8"  (1.727 m)   Wt 204 lb (92.5 kg)   SpO2 95%   BMI 31.02 kg/m   BP Readings from Last 3 Encounters:  04/11/23 (!) 170/98  03/08/23 124/80  01/06/23 134/88    Wt Readings from Last 3 Encounters:  04/11/23 204 lb (92.5 kg)  03/08/23 205 lb 12.8 oz (93.4 kg)  01/06/23 203 lb (92.1 kg)    Physical Exam Cardiovascular:     Rate and Rhythm: Normal rate and regular rhythm.     Heart sounds: Normal heart sounds and S2 normal. No murmur heard.    No gallop.     Comments: EKG- NSR, 79 bpm LAD - old No LVH, Q waves, or ST/T wave changes Musculoskeletal:     Right lower leg: No edema.     Left lower leg: No edema.     Lab Results  Component Value Date   WBC 7.2 04/11/2023   HGB 14.6 04/11/2023   HCT 44.4 04/11/2023   PLT 304.0 04/11/2023   GLUCOSE 209 (H) 04/11/2023   CHOL 249 (H) 03/04/2023   TRIG (H) 04/11/2023    1406.0 Triglyceride is over 400; calculations on Lipids are invalid.   HDL 29.60 (L) 03/04/2023   LDLDIRECT 76.0 03/04/2023   LDLCALC Comment (A) 12/28/2021   ALT 21 03/04/2023   AST 22 03/04/2023   NA 135 04/11/2023   K 4.1 04/11/2023   CL 100 04/11/2023   CREATININE 0.99 04/11/2023   BUN 16 04/11/2023   CO2 25 04/11/2023   TSH 1.88 05/04/2022   PSA 2.27 05/04/2022   HGBA1C 10.2 (H) 04/11/2023   MICROALBUR 114.8 (H) 04/11/2023    CT CARDIAC SCORING ([facility] LOCATIONS ONLY)  Result Date: 06/12/2021 CLINICAL DATA:  57 year old African American male with high cholesterol. EXAM: CT CARDIAC CORONARY ARTERY CALCIUM SCORE TECHNIQUE: Non-contrast imaging through the heart was performed using prospective ECG gating. Image post processing was performed on an independent workstation, allowing for quantitative analysis of the heart and coronary arteries. Note that this exam targets the heart and the chest was not imaged in its entirety. COMPARISON:  None. FINDINGS: Technical quality: Good CORONARY CALCIUM SCORES: Left Main:  No coronary artery calcification LAD: 276 LCx: 53 RCA: 3 CORONARY CALCIUM Total Agatston Score: 332 MESA database percentile: 97 Ascending aorta (normal <  40 mm): 32 mm EXTRACARDIAC FINDINGS: Limited view of the lung parenchyma demonstrates small focus of branching airspace disease in the RIGHT upper lobe (image 2/series 9). Airways are normal. Limited view of the mediastinum demonstrates no adenopathy. Esophagus normal. Limited view of the upper abdomen is unremarkable. Limited view of the skeleton and chest wall is unremarkable. IMPRESSION: 1. Three-vessel coronary artery calcification. 2. Total Agatston Score: 332 3. MESA age and sex matched database percentile: 68. 4. Small focus of airspace disease in the RIGHT upper lobe suggests subtle pulmonary infection. These results will be called to  the ordering clinician or representative by the Radiologist Assistant, and communication documented in the PACS or [employer]. Electronically Signed   By: Genevive Bi M.D.   On: 06/12/2021 08:02    Assessment & Plan:   Essential hypertension -     Urinalysis, Routine w reflex microscopic; Future -     Basic metabolic panel; Future -     CBC with Differential/Platelet; Future -     EKG 12-Lead -     Metoprolol Succinate ER; Take 1 tablet (25 mg total) by mouth daily.  Dispense: 90 tablet; Refill: 0  HYPERTRIGLYCERIDEMIA -     Triglycerides; Future -     Omega-3-acid Ethyl Esters; Take 2 capsules (2 g total) by mouth 2 (two) times daily.  Dispense: 360 capsule; Refill: 1  Isolated proteinuria without specific morphologic lesion -     Urinalysis, Routine w reflex microscopic; Future -     Microalbumin / creatinine urine ratio; Future  Diabetes mellitus type 1.5, managed as type 1 (HCC) -     Hemoglobin A1c; Future -     TechLite Pen Needles; Inject into the skin 4 (four) times daily.  Dispense: 300 each; Refill: 1  Hyperlipidemia with target LDL less than 100 -     Lipoprotein A (LPA);  Future     Follow-up: Return in about 6 weeks (around 05/23/2023).  Sanda Linger, MD

## 2023-04-12 ENCOUNTER — Other Ambulatory Visit (HOSPITAL_BASED_OUTPATIENT_CLINIC_OR_DEPARTMENT_OTHER): Payer: Self-pay

## 2023-04-12 MED ORDER — METOPROLOL SUCCINATE ER 25 MG PO TB24
25.0000 mg | ORAL_TABLET | Freq: Every day | ORAL | 0 refills | Status: AC
Start: 2023-04-12 — End: 2023-07-11
  Filled 2023-04-12 – 2023-04-28 (×2): qty 30, 30d supply, fill #0

## 2023-04-12 MED ORDER — OMEGA-3-ACID ETHYL ESTERS 1 G PO CAPS
2.0000 g | ORAL_CAPSULE | Freq: Two times a day (BID) | ORAL | 1 refills | Status: DC
Start: 2023-04-12 — End: 2023-04-28
  Filled 2023-04-12: qty 120, 30d supply, fill #0

## 2023-04-12 MED ORDER — TRIAMTERENE-HCTZ 37.5-25 MG PO CAPS
1.0000 | ORAL_CAPSULE | Freq: Every day | ORAL | 0 refills | Status: AC
Start: 2023-04-12 — End: ?
  Filled 2023-04-12: qty 30, 30d supply, fill #0

## 2023-04-14 LAB — LIPOPROTEIN A (LPA): Lipoprotein (a): <10 nmol/L (ref <75)

## 2023-04-14 NOTE — Addendum Note (Signed)
Addended by: Etta Grandchild on: 04/14/2023 07:37 AM   Modules accepted: Orders

## 2023-04-15 ENCOUNTER — Telehealth: Payer: Self-pay

## 2023-04-15 NOTE — Progress Notes (Signed)
   04/15/2023  Patient ID: Geraldine Solar, male   DOB: 13-May-1966, 57 y.o.   MRN: [id number]  Patient outreach to assist with affordability of Evaristo Bury in regard to clinic routed request from PCP, Dr. Yetta Barre  -Evaristo Bury Flextouch was going to cost >$800 -Per the patient, he has been out of the medication for months, but has continued to use Humalog  -Endorses FBG 170 and post-prandial 275-300 -Contacted patient's insurance plan and was told 30 days could be filled for $75 -[facility] 90 days for $187.5 under insurance's "smart 90" offer -Alternative medications,Toujeo and Semglee would be the same cost to the patient; and Lantus, Basaglar, Levemir not covered under her plan -Obtained Tresiba copay card, provided information to pharmacy, and claim is going through for $35/month now  Attempted to reach patient to inform him of the above information, but I was not able to reach him.  Left my direct phone number for him to return my call and will try to reach out again next week to see if prescription was picked up and check on home BG.  Lenna Gilford, PharmD, DPLA

## 2023-04-27 ENCOUNTER — Other Ambulatory Visit (HOSPITAL_BASED_OUTPATIENT_CLINIC_OR_DEPARTMENT_OTHER): Payer: Self-pay

## 2023-04-27 ENCOUNTER — Telehealth: Payer: Self-pay

## 2023-04-27 NOTE — Progress Notes (Signed)
   04/27/2023  Patient ID: Tim Wilson, male   DOB: Nov 12, 1965, 57 y.o.   MRN: [id number]  Patient outreach to inform Tim Wilson that his Tim Wilson is being filled by his pharmacy and going through for $35/month.  He states this is an affordable copay for him, and he will pick the medication up.  -Patient is currently taking farxiga 10mg , Humalog 10 BID, pioglitzaone 15mg  -FBG has been 180 -Was using Tresiba 100 units in the morning  -He has been without [place] for a couple of months now -In order to prevent hypoglycemia based on other medications and recently FBG, I instructed the patient to start Tresiba back at 50%, taking 50units in the morning and to hold Humalog morning dose if FBG<130  -Patient using Dexcom for CGM and fingersticks to monitor  Telephone visit scheduled in 1 week  Lenna Gilford, PharmD, DPLA

## 2023-04-28 ENCOUNTER — Other Ambulatory Visit: Payer: Self-pay | Admitting: Endocrinology

## 2023-04-28 ENCOUNTER — Telehealth: Payer: Self-pay | Admitting: Internal Medicine

## 2023-04-28 ENCOUNTER — Encounter: Payer: Self-pay | Admitting: Endocrinology

## 2023-04-28 ENCOUNTER — Ambulatory Visit: Payer: Managed Care, Other (non HMO) | Admitting: Endocrinology

## 2023-04-28 ENCOUNTER — Other Ambulatory Visit (HOSPITAL_BASED_OUTPATIENT_CLINIC_OR_DEPARTMENT_OTHER): Payer: Self-pay

## 2023-04-28 ENCOUNTER — Other Ambulatory Visit: Payer: Self-pay

## 2023-04-28 VITALS — BP 120/80 | HR 81 | Ht 68.0 in | Wt 200.2 lb

## 2023-04-28 DIAGNOSIS — E782 Mixed hyperlipidemia: Secondary | ICD-10-CM

## 2023-04-28 DIAGNOSIS — E781 Pure hyperglyceridemia: Secondary | ICD-10-CM | POA: Diagnosis not present

## 2023-04-28 DIAGNOSIS — Z794 Long term (current) use of insulin: Secondary | ICD-10-CM | POA: Diagnosis not present

## 2023-04-28 DIAGNOSIS — E1165 Type 2 diabetes mellitus with hyperglycemia: Secondary | ICD-10-CM

## 2023-04-28 MED ORDER — OMEGA-3-ACID ETHYL ESTERS 1 G PO CAPS
2.0000 g | ORAL_CAPSULE | Freq: Two times a day (BID) | ORAL | 1 refills | Status: AC
Start: 2023-04-28 — End: ?
  Filled 2023-05-23: qty 120, 30d supply, fill #0

## 2023-04-28 MED ORDER — DAPAGLIFLOZIN PROPANEDIOL 10 MG PO TABS
10.0000 mg | ORAL_TABLET | Freq: Every day | ORAL | 1 refills | Status: AC
  Filled 2023-04-28: qty 30, 30d supply, fill #0
  Filled 2023-06-14 (×2): qty 30, 30d supply, fill #1

## 2023-04-28 MED ORDER — FENOFIBRATE 145 MG PO TABS
145.0000 mg | ORAL_TABLET | Freq: Every day | ORAL | 1 refills | Status: AC
Start: 2023-04-28 — End: 2024-04-27
  Filled 2023-04-28: qty 30, 30d supply, fill #0
  Filled 2023-06-14 (×2): qty 30, 30d supply, fill #1

## 2023-04-28 MED ORDER — PIOGLITAZONE HCL 15 MG PO TABS
15.0000 mg | ORAL_TABLET | Freq: Every day | ORAL | 1 refills | Status: AC
  Filled 2023-04-28: qty 30, 30d supply, fill #0
  Filled 2023-06-14 (×2): qty 30, 30d supply, fill #1

## 2023-04-28 NOTE — Patient Instructions (Signed)
Check blood sugars on waking up 5  days a week  Also check blood sugars about 2 hours after meals and do this after different meals by rotation  Recommended blood sugar levels on waking up are 90-130 and about 2 hours after meal is 130-160  Please bring your blood sugar monitor to each visit, thank you  

## 2023-04-28 NOTE — Telephone Encounter (Signed)
Pt wanting to know why he is having to go to [facility] imaging tomorrow. Please advise.

## 2023-04-28 NOTE — Progress Notes (Signed)
Patient ID: Tim Wilson, male   DOB: 02/19/1966, 57 y.o.   MRN: [id number]           Reason for Appointment: Endocrinology follow-up   History of Present Illness   Diagnosis date: 2012  Previous history:  Non-insulin hypoglycemic drugs previously used: Metformin, Amaryl, Farxiga, Trulicity, Rybelsus, Januvia Reportedly had diarrhea with Invokana and could not afford co-pay for Rybelsus previously Insulin was started in 2012 but more regularly since 2019 He has previously taken NovoLog mix insulin and subsequently Toujeo and Humalog  A1c range in the last few years is: 8.8-14.6  Recent history:     Non-insulin hypoglycemic drugs: Actos 15 mg daily, Farxiga 10 mg daily     Insulin regimen: Humalog 10 units twice daily        Side effects from medications: Diarrhea from metformin  Current self management, blood sugar patterns and problems identified:  A1c is 10.2 recently and about the same  He had difficulties getting the [place] prescribed on the last visit and did not call us.  He was finally helped by his PCPs office to get the prior authorization and just started taking this yesterday With this his blood sugars are still poorly controlled He has however started taking [place] although I do not see a recent refill listed However he has only 2 blood sugars in the last 3 weeks ranging from 1 74-259 He previously had used the Dexcom but not clear why he is not using this again even though this is covered by his insurance Given with high blood sugars he has not increased his Humalog and is still taking 10 units He is sometimes skipping breakfast he is still quite active with riding his bike doing some 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 Weight is slightly lower  Exercise: 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, bike    Hypoglycemia:  none    Previous data:  OVERNIGHT blood sugars are mostly mildly increased throughout and fairly  stable, slightly higher starting off at midnight No hypoglycemia overnight Daytime blood sugars are also averaging between 150-160 before meals He has inconsistent rise in blood sugars over 200 after meals at various times but postprandial are which is not significantly high except late at night No hypoglycemia  CGM use % of time 86  2-week average/GV 153  Time in range 78  % Time Above 180 22  % Time above 250   % Time Below 70 0     PRE-MEAL Fasting Lunch Dinner Bedtime Overall  Glucose range:       Averages: 149   170 153   POST-MEAL PC Breakfast PC Lunch PC Dinner  Glucose range:     Averages:  152 158     Dietician visit: Most recent:   None recently  Weight control:  Wt Readings from Last 3 Encounters:  04/28/23 200 lb 3.2 oz (90.8 kg)  04/11/23 204 lb (92.5 kg)  03/08/23 205 lb 12.8 oz (93.4 kg)            Diabetes labs:  Lab Results  Component Value Date   HGBA1C 10.2 (H) 04/11/2023   HGBA1C 10.0 (H) 01/03/2023   HGBA1C 8.7 Repeated and verified X2. (H) 07/26/2022   Lab Results  Component Value Date   MICROALBUR 114.8 (H) 04/11/2023   LDLCALC Comment (A) 12/28/2021   CREATININE 0.99 04/11/2023    Lab Results  Component Value Date   FRUCTOSAMINE 359 (H) 03/04/2023  FRUCTOSAMINE 262 07/26/2022   FRUCTOSAMINE 266 09/19/2015     Allergies as of 04/28/2023       Reactions   Metformin And Related Diarrhea   Ramipril    cough        Medication List        Accurate as of April 28, 2023 10:40 AM. If you have any questions, ask your nurse or doctor.          Accu-Chek Guide test strip Generic drug: glucose blood Use to check blood sugar 2 (two) times daily.   Accu-Chek Softclix Lancets lancets Use to check blood sugar twice a day   aspirin EC 325 MG tablet Take by mouth.   D3-50 1.25 MG (50000 UT) capsule Generic drug: Cholecalciferol Take 1 capsule (50,000 Units total) by mouth once a week.   dapagliflozin propanediol 10 MG  Tabs tablet Commonly known as: Farxiga Take 1 tablet (10 mg total) by mouth daily before breakfast.   fenofibrate 145 MG tablet Commonly known as: TRICOR Take 1 tablet (145 mg total) by mouth daily.   FreeStyle Libre 2 Sensor Misc Use to check blood sugar and change every 14 days   Dexcom G7 Sensor Misc Use as directed and change sensor every 10 days.   insulin lispro 100 UNIT/ML KwikPen Commonly known as: HumaLOG KwikPen INJECT 10 UNITS DAILY WITH BREAKFAST What changed: additional instructions   metoprolol succinate 25 MG 24 hr tablet Commonly known as: TOPROL-XL Take 1 tablet (25 mg total) by mouth daily.   olmesartan 20 MG tablet Commonly known as: BENICAR Take 1 tablet (20 mg total) by mouth daily.   omega-3 acid ethyl esters 1 g capsule Commonly known as: LOVAZA Take 2 capsules (2 g total) by mouth 2 (two) times daily.   pioglitazone 15 MG tablet Commonly known as: Actos Take 1 tablet (15 mg total) by mouth daily.   rosuvastatin 20 MG tablet Commonly known as: CRESTOR Take 1 tablet (20 mg total) by mouth daily.   TechLite Pen Needles 32G X 6 MM Misc Generic drug: Insulin Pen Needle Inject into the skin 4 (four) times daily.   Evaristo Bury FlexTouch 200 UNIT/ML FlexTouch Pen Generic drug: insulin degludec Inject 100 Units into the skin daily.   triamterene-hydrochlorothiazide 37.5-25 MG capsule Commonly known as: Dyazide Take 1 each (1 capsule total) by mouth daily.        Allergies:  Allergies  Allergen Reactions   Metformin And Related Diarrhea   Ramipril     cough    Past Medical History:  Diagnosis Date   Diabetes mellitus    Hyperlipidemia    Hypertension     No past surgical history on file.  Family History  Problem Relation Age of Onset   Heart disease Father    Heart attack Father 58   Heart disease Brother    Heart attack Brother 78   Diabetes Mother    Heart attack Mother    Breast cancer Cousin    Heart attack Paternal Uncle     Heart attack Paternal Uncle    Stroke Paternal Uncle    Heart attack Paternal Uncle 80       5 HA in the past    Colon cancer Neg Hx    Stomach cancer Neg Hx    Esophageal cancer Neg Hx     Social History:  reports that he has never smoked. He has never used smokeless tobacco. He reports current alcohol use of about 2.0 standard  drinks of alcohol per week. He reports that he does not use drugs.  Review of Systems:  Last diabetic eye exam date 3/23  Last foot exam date: 10/22  Symptoms of neuropathy: None  Urine microalbumin: Normal as of 8/23 No history of renal dysfunction  Lab Results  Component Value Date   CREATININE 0.99 04/11/2023   CREATININE 1.12 03/04/2023   CREATININE 1.54 (H) 01/03/2023    Hypertension:   Treatment includes Olmesartan 20 mg daily  BP Readings from Last 3 Encounters:  04/28/23 120/80  04/11/23 (!) 170/98  03/08/23 124/80    Lipid management:    He has had high triglycerides, over 7000 at the highest level  He has been prescribed Crestor 20 mg, Vascepa and fenofibrate by his PCP Had been on Vascepa since 3/22 Even though he thinks he is taking his Vascepa and fenofibrate pharmacy data from epic indicates he has not had these prescriptions filled Also takes cod liver oil and fish oil Triglycerides are over 1400   Lab Results  Component Value Date   CHOL 249 (H) 03/04/2023   CHOL 421 (H) 01/03/2023   CHOL 297 (H) 05/04/2022   Lab Results  Component Value Date   HDL 29.60 (L) 03/04/2023   HDL 26.50 (L) 01/03/2023   HDL 32.50 (L) 05/04/2022   Lab Results  Component Value Date   LDLCALC Comment (A) 12/28/2021   LDLCALC 115 (H) 06/23/2021   LDLCALC  12/11/2020     Comment:     . LDL cholesterol not calculated. Triglyceride levels greater than 400 mg/dL invalidate calculated LDL results. . Reference range: <100 . Desirable range <100 mg/dL for primary prevention;   <70 mg/dL for patients with CHD or diabetic patients   with > or = 2 CHD risk factors. Marland Kitchen LDL-C is now calculated using the Martin-Hopkins  calculation, which is a validated novel method providing  better accuracy than the Friedewald equation in the  estimation of LDL-C.  Horald Pollen et al. Lenox Ahr. 4259;563(87): 2061-2068  (http://education.[facility].com/faq/FAQ164)    Lab Results  Component Value Date   TRIG (H) 04/11/2023    1406.0 Triglyceride is over 400; calculations on Lipids are invalid.   TRIG (H) 03/04/2023    843.0 Triglyceride is over 400; calculations on Lipids are invalid.   TRIG (H) 01/03/2023    3933.0 Triglyceride is over 400; calculations on Lipids are invalid.   Lab Results  Component Value Date   CHOLHDL 8 03/04/2023   CHOLHDL 16 01/03/2023   CHOLHDL 9 05/04/2022   Lab Results  Component Value Date   LDLDIRECT 76.0 03/04/2023   LDLDIRECT 70.0 01/03/2023   LDLDIRECT 54.0 05/04/2022     Examination:   BP 120/80   Pulse 81   Ht 5\' 8"  (1.727 m)   Wt 200 lb 3.2 oz (90.8 kg)   SpO2 96%   BMI 30.44 kg/m   Body mass index is 30.44 kg/m.    ASSESSMENT/ PLAN:    Diabetes type 2 insulin-dependent:   Current regimen Humalog 10 units twice daily, Actos and no basal  See history of present illness for detailed discussion of current diabetes management, blood sugar patterns and problems identified  A1c is about the same at 10.2  Blood glucose control is totally inadequate again from not taking any basal insulin and also continued insulin deficiency and insulin resistance however he just received his [place] after approval He also has not taken his [place] or monitored his blood sugar  Plan: Continue [place]  10 mg  Restart TRESIBA 100 units but he will adjust the dose by 5 to 10 units weekly to keep the morning sugars between 90-130  He will start using the Dexcom sensor regularly to monitor his blood sugar Also discussed that if his blood sugars are rising more than 60 to 70 mg after meals we will go  up to 15 units on the Humalog coverage Consistent diet  MIXED hyperlipidemia: Triglycerides markedly high, uncontrolled likely to be from increased hyperglycemia and not getting any prescription Vascepa This Lovaza appears to be covered without PA he will be given a prescription to start this Continue fenofibrate  Needs fasting lipids on next visit  MICROALBUMINURIA: This is likely partly related to poor diabetes control and can be followed Do not think he has a renal problem and with normal creatinine again does not need a renal ultrasound He will discuss with his PCP  There are no Patient Instructions on file for this visit.  Total visit time for evaluation and management of above problems, counseling = 30 minutes   Reather Littler 04/28/2023, 10:40 AM

## 2023-04-29 ENCOUNTER — Other Ambulatory Visit: Payer: Managed Care, Other (non HMO)

## 2023-04-29 ENCOUNTER — Other Ambulatory Visit: Payer: Self-pay | Admitting: Endocrinology

## 2023-04-29 ENCOUNTER — Other Ambulatory Visit (HOSPITAL_BASED_OUTPATIENT_CLINIC_OR_DEPARTMENT_OTHER): Payer: Self-pay

## 2023-04-29 DIAGNOSIS — E781 Pure hyperglyceridemia: Secondary | ICD-10-CM

## 2023-05-02 ENCOUNTER — Other Ambulatory Visit (HOSPITAL_BASED_OUTPATIENT_CLINIC_OR_DEPARTMENT_OTHER): Payer: Self-pay

## 2023-05-04 ENCOUNTER — Other Ambulatory Visit: Payer: Managed Care, Other (non HMO)

## 2023-05-04 NOTE — Progress Notes (Signed)
   05/04/2023  Patient ID: Tim Wilson, male   DOB: Apr 02, 1966, 57 y.o.   MRN: [id number]  Outreach for scheduled telephone follow-up visit unsuccessful.  I left HIPAA compliant voicemail with my direct number for patient to return my call and will also send MyChart message.    Lenna Gilford, PharmD, DPLA

## 2023-05-23 ENCOUNTER — Other Ambulatory Visit (HOSPITAL_BASED_OUTPATIENT_CLINIC_OR_DEPARTMENT_OTHER): Payer: Self-pay

## 2023-05-26 ENCOUNTER — Telehealth: Payer: Self-pay

## 2023-05-26 NOTE — Progress Notes (Signed)
   05/26/2023  Patient ID: Tim Wilson, male   DOB: Aug 25, 1966, 57 y.o.   MRN: [id number]  Received message from PCP office stating patient was having trouble getting his Tim Wilson due to cost.  -At last visit, [facility] to make [facility], and patient was starting back at 50 units daily since he had been without medication for a while -Copay for this fill is going to be $100 [employer], and they state insurance is not paying for the [place]; and they are having to bill copay card as primary coverage -I offered to contact insurance for assistance with billing,  but technician states she is going to and will follow-up with patient -Contacted Tim Wilson to let him know to expect a call from [place] with [facility] and asked that he let me know either way if resolved or not, so I can further assist if needed  Lenna Gilford, PharmD, DPLA

## 2023-06-01 ENCOUNTER — Telehealth: Payer: Self-pay

## 2023-06-01 NOTE — Progress Notes (Signed)
   06/01/2023  Patient ID: Tim Wilson, male   DOB: [birth year], 56 y.o.   MRN: [id number]  S/O Telephone follow-up to verify patient was able to pick up Tresiba from pharmacy  Diabetes Medication Assistance -Current medications:  pioglitazone 15mg  daily, Humalog 10 units with breakfast and supper, Tresiba 50 units daily, Farxiga 10mg  daily -Using Dexcom G7 for CGM -FBG 100-130, bedtime last night 154 -Patient does not endorse s/sx of hypoglycemia or hyperglycemia -He was able to [facility] for $100 copay and states this is for a 90 day supply  A/P  Diabetes Medication Assistance -Patient sees Dr. Erroll Luna with endocrinology on 10/25 to follow-up on DM control -Continue current regimen at this time  Follow-up:  None scheduled but can as recommended per PCP/endo.  Patient has my number if there are future medication/affordability concerns  Lenna Gilford, PharmD, DPLA

## 2023-06-14 ENCOUNTER — Other Ambulatory Visit: Payer: Self-pay

## 2023-06-14 ENCOUNTER — Other Ambulatory Visit (HOSPITAL_BASED_OUTPATIENT_CLINIC_OR_DEPARTMENT_OTHER): Payer: Self-pay

## 2023-06-22 ENCOUNTER — Telehealth: Payer: Self-pay | Admitting: Internal Medicine

## 2023-06-22 NOTE — Telephone Encounter (Signed)
Pt has been informed of 7/8 MyChart note:   Govind ---   Your A1C is up to 10.2%. There is protein in your urine - I have ordered a referral to a kidney doctor. Your blood pressure was too high so I prescribed a diuretic. Your triglycerides are very high. I have prescribed lovaza.   Sanda Linger, MD  Pt has been informed and expressed understanding.

## 2023-06-22 NOTE — Telephone Encounter (Signed)
Pt is HIGHLY concern about his previous referral to a kidney specialist that Dr Yetta Barre referred him to;   [id number]    He wants his nurse to contact him at her earliest convenience regarding the referral.

## 2023-07-06 ENCOUNTER — Other Ambulatory Visit: Payer: Self-pay | Admitting: Physician Assistant

## 2023-07-06 DIAGNOSIS — R809 Proteinuria, unspecified: Secondary | ICD-10-CM

## 2023-07-07 LAB — LAB REPORT - SCANNED
Creatinine, POC: 130.7 mg/dL
EGFR: 86

## 2023-07-19 ENCOUNTER — Ambulatory Visit
Admission: RE | Admit: 2023-07-19 | Discharge: 2023-07-19 | Disposition: A | Discharge status: Home / Self Care | Payer: Managed Care, Other (non HMO) | Source: Ambulatory Visit | Attending: Physician Assistant

## 2023-07-19 DIAGNOSIS — R809 Proteinuria, unspecified: Secondary | ICD-10-CM

## 2023-07-25 ENCOUNTER — Other Ambulatory Visit (INDEPENDENT_AMBULATORY_CARE_PROVIDER_SITE_OTHER): Payer: Managed Care, Other (non HMO)

## 2023-07-25 DIAGNOSIS — Z794 Long term (current) use of insulin: Secondary | ICD-10-CM | POA: Diagnosis not present

## 2023-07-25 DIAGNOSIS — E782 Mixed hyperlipidemia: Secondary | ICD-10-CM

## 2023-07-25 DIAGNOSIS — E1165 Type 2 diabetes mellitus with hyperglycemia: Secondary | ICD-10-CM

## 2023-07-25 LAB — LIPID PANEL
Cholesterol: 215 mg/dL — ABNORMAL HIGH (ref 0–200)
HDL: 32.6 mg/dL — ABNORMAL LOW (ref >39.00)
Total CHOL/HDL Ratio: 7
Triglycerides: 624 mg/dL — ABNORMAL HIGH (ref 0.0–149.0)

## 2023-07-25 LAB — LDL CHOLESTEROL, DIRECT: Direct LDL: 97 mg/dL

## 2023-07-25 LAB — MICROALBUMIN / CREATININE URINE RATIO
Creatinine,U: 146 mg/dL
Microalb Creat Ratio: 14.6 mg/g (ref 0.0–30.0)
Microalb, Ur: 21.3 mg/dL — ABNORMAL HIGH (ref 0.0–1.9)

## 2023-07-25 LAB — BASIC METABOLIC PANEL
BUN: 11 mg/dL (ref 6–23)
CO2: 26 meq/L (ref 19–32)
Calcium: 9.6 mg/dL (ref 8.4–10.5)
Chloride: 102 meq/L (ref 96–112)
Creatinine, Ser: 0.96 mg/dL (ref 0.40–1.50)
GFR: 87.69 mL/min (ref >60.00)
Glucose, Bld: 184 mg/dL — ABNORMAL HIGH (ref 70–99)
Potassium: 3.9 meq/L (ref 3.5–5.1)
Sodium: 138 meq/L (ref 135–145)

## 2023-07-25 LAB — HEMOGLOBIN A1C: Hgb A1c MFr Bld: 8.5 % — ABNORMAL HIGH (ref 4.6–6.5)

## 2023-07-29 ENCOUNTER — Other Ambulatory Visit (HOSPITAL_BASED_OUTPATIENT_CLINIC_OR_DEPARTMENT_OTHER): Payer: Self-pay

## 2023-07-29 ENCOUNTER — Other Ambulatory Visit: Payer: Self-pay

## 2023-07-29 ENCOUNTER — Encounter: Payer: Self-pay | Admitting: Endocrinology

## 2023-07-29 ENCOUNTER — Ambulatory Visit: Payer: Managed Care, Other (non HMO) | Admitting: Endocrinology

## 2023-07-29 VITALS — BP 158/90 | HR 77 | Resp 20 | Ht 68.0 in | Wt 208.8 lb

## 2023-07-29 DIAGNOSIS — E1165 Type 2 diabetes mellitus with hyperglycemia: Secondary | ICD-10-CM | POA: Diagnosis not present

## 2023-07-29 DIAGNOSIS — E781 Pure hyperglyceridemia: Secondary | ICD-10-CM

## 2023-07-29 DIAGNOSIS — Z794 Long term (current) use of insulin: Secondary | ICD-10-CM | POA: Diagnosis not present

## 2023-07-29 DIAGNOSIS — E139 Other specified diabetes mellitus without complications: Secondary | ICD-10-CM

## 2023-07-29 DIAGNOSIS — E1169 Type 2 diabetes mellitus with other specified complication: Secondary | ICD-10-CM

## 2023-07-29 MED ORDER — EMPAGLIFLOZIN 10 MG PO TABS
10.0000 mg | ORAL_TABLET | Freq: Every day | ORAL | 3 refills | Status: DC
  Filled 2023-07-29: qty 30, 30d supply, fill #0

## 2023-07-29 MED ORDER — INSULIN LISPRO (1 UNIT DIAL) 100 UNIT/ML (KWIKPEN): PEN_INJECTOR | SUBCUTANEOUS | 2 refills | Status: DC

## 2023-07-29 MED ORDER — TRESIBA FLEXTOUCH 200 UNIT/ML ~~LOC~~ SOPN: 50.0000 [IU] | PEN_INJECTOR | Freq: Every day | SUBCUTANEOUS | 3 refills | Status: DC

## 2023-07-29 MED ORDER — DEXCOM G7 SENSOR MISC
1.0000 | 3 refills | Status: AC
  Filled 2023-07-29: qty 9, 83d supply, fill #0
  Filled 2023-08-03: qty 3, 30d supply, fill #0

## 2023-07-29 NOTE — Progress Notes (Unsigned)
Outpatient Endocrinology Note [place] Keygan Dumond, MD  07/30/23  Patient's Name: Tim Wilson    DOB: 1965-12-05    MRN: [id number]                                                    REASON OF VISIT: Follow up of type 2 diabetes mellitus  REFERRING PROVIDER:   PCP: Etta Grandchild, MD  HISTORY OF PRESENT ILLNESS:   Tim Wilson is a 57 y.o. old male with past medical history listed below, is here for follow up for type 2 diabetes mellitus.   Pertinent Diabetes History: Patient was diagnosed with type 2 diabetes mellitus in 2012.  He was initially on insulin therapy at the time of diagnosis however later regularly started in 2019.  He has uncontrolled type 2 diabetes mellitus hemoglobin A1c in the range of 8 to 14.6%.  Chronic Diabetes Complications : Retinopathy: no. Last ophthalmology exam was done on annually, following with ophthalmology regularly.  Nephropathy: Microalbuminuria present, on ACE/ARB  Peripheral neuropathy: sometimes numbness / tingling. Coronary artery disease: no Stroke: no  Relevant comorbidities and cardiovascular risk factors: Obesity: yes Body mass index is 31.75 kg/m.  Hypertension: Yes  Hyperlipidemia : Yes, on statin / Vasepa/   Current / Home Diabetic regimen includes: Tresiba 50 units daily.  Humalog 10 units, not taking these days.  Actos 15 mg daily. Farxiga 10mg  daily, ran out.  Was expensive.  Prior diabetic medications: Metformin, glimepiride, Trulicity, Rybelsus, Januvia in the past.  Diarrhea with metformin.  Glycemic data:   He has Accu-Chek guide glucometer.  He has been checking blood sugar occasionally.  Lowest blood sugar 126, highest blood sugar 175.  Average blood sugar in last 2 weeks 151.  Blood sugar download from October 11 to July 29, 2023 reviewed.  He had used Dexcom in the past.  Hypoglycemia: Patient has no hypoglycemic episodes. Patient has hypoglycemia awareness.  Factors modifying glucose control: 1.  Diabetic  diet assessment: 3 meals a day.  2.  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  3.  Medication compliance: compliant most of the time.  Interval history  Diabetes received on glucometer data as reviewed above.  He has been slowly cutting down on [place] currently taking 50 units daily.  He has no longer taking Humalog.  He is also not taking [place] due to cost.  Hemoglobin A1c 8.5% improving.  No other complaints today.  REVIEW OF SYSTEMS As per history of present illness.   PAST MEDICAL HISTORY: Past Medical History:  Diagnosis Date   Diabetes mellitus    Hyperlipidemia    Hypertension     PAST SURGICAL HISTORY: History reviewed. No pertinent surgical history.  ALLERGIES: Allergies  Allergen Reactions   Metformin And Related Diarrhea   Ramipril     cough    FAMILY HISTORY:  Family History  Problem Relation Age of Onset   Heart disease Father    Heart attack Father 31   Heart disease Brother    Heart attack Brother 101   Diabetes Mother    Heart attack Mother    Breast cancer Cousin    Heart attack Paternal Uncle    Heart attack Paternal Uncle    Stroke Paternal Uncle    Heart attack Paternal Uncle 59  5 HA in the past    Colon cancer Neg Hx    Stomach cancer Neg Hx    Esophageal cancer Neg Hx     SOCIAL HISTORY: Social History   Socioeconomic History   Marital status: Married    Spouse name: Not on file   Number of children: 5   Years of education: Not on file   Highest education level: Not on file  Occupational History   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  Tobacco Use   Smoking status: Never   Smokeless tobacco: Never  Vaping Use   Vaping status: Never Used  Substance and Sexual Activity   Alcohol use: Yes    Alcohol/week: 2.0 standard drinks of alcohol    Types: 2 Cans of beer per week    Comment:  on occasion, not in excess   Drug use: No   Sexual activity: Yes  Other Topics Concern   Not on file  Social History Narrative   Regular exercise-yes   Social Determinants of Health   Financial Resource Strain: Not on file  Food Insecurity: Not on file  Transportation Needs: Not on file  Physical Activity: Not on file  Stress: Not on file  Social Connections: Not on file    MEDICATIONS:  Current Outpatient Medications  Medication Sig Dispense Refill   Accu-Chek Softclix Lancets lancets Use to check blood sugar twice a day 200 each 3   aspirin EC 325 MG tablet Take by mouth.     Cholecalciferol 1.25 MG (50000 UT) capsule Take 1 capsule (50,000 Units total) by mouth once a week. 12 capsule 0   dapagliflozin propanediol (FARXIGA) 10 MG TABS tablet Take 1 tablet (10 mg total) by mouth daily before breakfast. 90 tablet 1   empagliflozin (JARDIANCE) 10 MG TABS tablet Take 1 tablet (10 mg total) by mouth daily before breakfast. 90 tablet 3   fenofibrate (TRICOR) 145 MG tablet Take 1 tablet (145 mg total) by mouth daily. 90 tablet 1   glucose blood (ACCU-CHEK GUIDE) test strip Use to check blood sugar 2 (two) times daily. 200 each 3   Insulin Pen Needle (TECHLITE PEN NEEDLES) 32G X 6 MM MISC Inject into the skin 4 (four) times daily. 300 each 1   olmesartan (BENICAR) 20 MG tablet Take 1 tablet (20 mg total) by mouth daily. 90 tablet 0   omega-3 acid ethyl esters (LOVAZA) 1 g capsule Take 2 capsules (2 g total) by mouth 2 (two) times daily. 360 capsule 1   pioglitazone (ACTOS) 15 MG tablet Take 1 tablet (15 mg total) by mouth daily. 90 tablet 1   rosuvastatin (CRESTOR) 20 MG tablet Take 1 tablet (20 mg total) by mouth daily. 90 tablet 0   triamterene-hydrochlorothiazide (DYAZIDE) 37.5-25 MG capsule Take 1 each (1 capsule total) by mouth daily. 90 capsule 0   Continuous Glucose Sensor (DEXCOM G7 SENSOR) MISC Use as directed and change sensor every 10 days. 9 each 3   insulin degludec  (TRESIBA FLEXTOUCH) 200 UNIT/ML FlexTouch Pen Inject 50 Units into the skin daily. 45 mL 3   insulin lispro (HUMALOG KWIKPEN) 100 UNIT/ML KwikPen INJECT 10 UNITS DAILY WITH BREAKFAST and supper 30 mL 2   metoprolol succinate (TOPROL-XL) 25 MG 24 hr tablet Take 1 tablet (25 mg total) by mouth daily. 90 tablet 0   No current facility-administered medications for this visit.    PHYSICAL EXAM:  Vitals:   07/29/23 0918  BP: (!) 158/90  Pulse: 77  Resp: 20  SpO2: 97%  Weight: 208 lb 12.8 oz (94.7 kg)  Height: 5\' 8"  (1.727 m)   Body mass index is 31.75 kg/m.  Wt Readings from Last 3 Encounters:  07/29/23 208 lb 12.8 oz (94.7 kg)  04/28/23 200 lb 3.2 oz (90.8 kg)  04/11/23 204 lb (92.5 kg)    General: Well developed, well nourished male in no apparent distress.  HEENT: AT/[place], no external lesions.  Eyes: Conjunctiva clear and no icterus. Neck: Neck supple  Lungs: Respirations not labored Neurologic: Alert, oriented, normal speech Extremities / Skin: Dry.   Psychiatric: Does not appear depressed or anxious  Diabetic Foot Exam - Simple   No data filed    LABS Reviewed Lab Results  Component Value Date   HGBA1C 8.5 (H) 07/25/2023   HGBA1C 10.2 (H) 04/11/2023   HGBA1C 10.0 (H) 01/03/2023   Lab Results  Component Value Date   FRUCTOSAMINE 359 (H) 03/04/2023   FRUCTOSAMINE 262 07/26/2022   FRUCTOSAMINE 266 09/19/2015   Lab Results  Component Value Date   CHOL 215 (H) 07/25/2023   HDL 32.60 (L) 07/25/2023   LDLCALC Comment (A) 12/28/2021   LDLDIRECT 97.0 07/25/2023   TRIG (H) 07/25/2023    624.0 Triglyceride is over 400; calculations on Lipids are invalid.   CHOLHDL 7 07/25/2023   Lab Results  Component Value Date   MICRALBCREAT 14.6 07/25/2023   MICRALBCREAT 74.6 (H) 04/11/2023   Lab Results  Component Value Date   CREATININE 0.96 07/25/2023   Lab Results  Component Value Date   GFR 87.69 07/25/2023    ASSESSMENT / PLAN  1. Type 2 diabetes mellitus with  other specified complication, with long-term current use of insulin (HCC)   2. HYPERTRIGLYCERIDEMIA   3. Type 2 diabetes mellitus with hyperglycemia, with long-term current use of insulin (HCC)     Diabetes Mellitus type 2, complicated by microalbuminuria/neuropathy. - Diabetic status / severity: Uncontrolled, improving.  Lab Results  Component Value Date   HGBA1C 8.5 (H) 07/25/2023    - Hemoglobin A1c goal : <7%  He has history of acute pancreatitis in 2019 will avoid GLP-1 receptor agonist.  - Medications:  Continue Tresiba 50 units daily.  Advised to take Humalog 10 units with meals continuing significant amount of carbohydrate, not with snacks or a small meal.   Continue Actos 15 mg daily. Will prescribe Jardiance 10 mg daily.  Asked to check price with the pharmacy.  - Home glucose testing: Before meals and at bedtime.  Prescribed Dexcom G7 CGM. - Discussed/ Gave Hypoglycemia treatment plan.  # Consult : not required at this time.   # Annual urine for microalbuminuria/ creatinine ratio, no microalbuminuria currently, continue ACE/ARB /olmesartan. Last  Lab Results  Component Value Date   MICRALBCREAT 14.6 07/25/2023    # Foot check nightly / neuropathy.  # Annual dilated diabetic eye exams.   - Diet: Eat reasonable portion sizes to promote a healthy weight - Life style / activity / exercise: Discussed.  2. Blood pressure  -  BP Readings from Last 1 Encounters:  07/29/23 (!) 158/90    - Control is not in target.  - No change in current plans.  Discussed about importance of blood pressure control.  Patient will talk with primary care provider.  3. Lipid status / Hyperlipidemia /hypertriglyceridemia - Last  Lab Results  Component Value Date   LDLCALC Comment (A) 12/28/2021   -  Continue rosuvastatin 20 mg daily.  Fenofibrate 145 mg daily.  Tillman Sers Riesa Pope daily.  Triglycerides improving.  Diagnoses and all orders for this visit:  Type 2 diabetes mellitus  with other specified complication, with long-term current use of insulin (HCC) -     Hemoglobin A1c; Future -     Basic metabolic panel; Future  HYPERTRIGLYCERIDEMIA -     insulin lispro (HUMALOG KWIKPEN) 100 UNIT/ML KwikPen; INJECT 10 UNITS DAILY WITH BREAKFAST and supper  Type 2 diabetes mellitus with hyperglycemia, with long-term current use of insulin (HCC) -     insulin lispro (HUMALOG KWIKPEN) 100 UNIT/ML KwikPen; INJECT 10 UNITS DAILY WITH BREAKFAST and supper  Other orders -     empagliflozin (JARDIANCE) 10 MG TABS tablet; Take 1 tablet (10 mg total) by mouth daily before breakfast. -     insulin degludec (TRESIBA FLEXTOUCH) 200 UNIT/ML FlexTouch Pen; Inject 50 Units into the skin daily. -     Continuous Glucose Sensor (DEXCOM G7 SENSOR) MISC; Use as directed and change sensor every 10 days.    DISPOSITION Follow up in clinic in 3  months suggested.   All questions answered and patient verbalized understanding of the plan.  [place] Terrilee Dudzik, MD [facility] Endocrinology [facility] [address] [place], [address] [place], [place] [address] Phone # [phone]  At least part of this note was generated using voice recognition software. Inadvertent word errors may have occurred, which were not recognized during the proofreading process.

## 2023-07-30 NOTE — Patient Instructions (Signed)
For

## 2023-08-03 ENCOUNTER — Other Ambulatory Visit (HOSPITAL_COMMUNITY): Payer: Self-pay

## 2023-08-03 ENCOUNTER — Other Ambulatory Visit: Payer: Self-pay

## 2023-08-03 ENCOUNTER — Other Ambulatory Visit (HOSPITAL_BASED_OUTPATIENT_CLINIC_OR_DEPARTMENT_OTHER): Payer: Self-pay

## 2023-10-13 ENCOUNTER — Other Ambulatory Visit (HOSPITAL_COMMUNITY): Payer: Self-pay

## 2023-10-21 ENCOUNTER — Other Ambulatory Visit: Payer: BLUE CROSS/BLUE SHIELD

## 2023-10-21 ENCOUNTER — Telehealth: Payer: Self-pay

## 2023-10-21 DIAGNOSIS — E1169 Type 2 diabetes mellitus with other specified complication: Secondary | ICD-10-CM

## 2023-10-21 NOTE — Telephone Encounter (Signed)
Orders Placed This Encounter  Procedures   Hemoglobin A1c   Basic metabolic panel

## 2023-10-22 LAB — BASIC METABOLIC PANEL
BUN: 12 mg/dL (ref 7–25)
CO2: 21 mmol/L (ref 20–32)
Calcium: 9.3 mg/dL (ref 8.6–10.3)
Chloride: 100 mmol/L (ref 98–110)
Creat: 0.98 mg/dL (ref 0.70–1.30)
Glucose, Bld: 332 mg/dL — ABNORMAL HIGH (ref 65–99)
Potassium: 4.3 mmol/L (ref 3.5–5.3)
Sodium: 134 mmol/L — ABNORMAL LOW (ref 135–146)

## 2023-10-22 LAB — HEMOGLOBIN A1C
Hgb A1c MFr Bld: 10.7 %{Hb} — ABNORMAL HIGH (ref <5.7)
Mean Plasma Glucose: 260 mg/dL
eAG (mmol/L): 14.4 mmol/L

## 2023-10-24 ENCOUNTER — Other Ambulatory Visit: Payer: Managed Care, Other (non HMO)

## 2023-10-25 ENCOUNTER — Encounter: Payer: Self-pay | Admitting: Endocrinology

## 2023-10-31 ENCOUNTER — Encounter: Payer: Self-pay | Admitting: Endocrinology

## 2023-10-31 ENCOUNTER — Ambulatory Visit: Payer: BLUE CROSS/BLUE SHIELD | Admitting: Endocrinology

## 2023-10-31 ENCOUNTER — Other Ambulatory Visit (HOSPITAL_BASED_OUTPATIENT_CLINIC_OR_DEPARTMENT_OTHER): Payer: Self-pay

## 2023-10-31 VITALS — BP 134/80 | HR 81 | Resp 20 | Ht 68.0 in | Wt 202.2 lb

## 2023-10-31 DIAGNOSIS — Z794 Long term (current) use of insulin: Secondary | ICD-10-CM

## 2023-10-31 DIAGNOSIS — E1165 Type 2 diabetes mellitus with hyperglycemia: Secondary | ICD-10-CM

## 2023-10-31 DIAGNOSIS — E781 Pure hyperglyceridemia: Secondary | ICD-10-CM | POA: Diagnosis not present

## 2023-10-31 MED ORDER — DEXCOM G7 SENSOR MISC
1.0000 | 3 refills | Status: AC
  Filled 2023-10-31 – 2023-11-29 (×2): qty 9, 90d supply, fill #0

## 2023-10-31 MED ORDER — INSULIN LISPRO (1 UNIT DIAL) 100 UNIT/ML (KWIKPEN): PEN_INJECTOR | SUBCUTANEOUS | 2 refills | Status: AC

## 2023-10-31 MED ORDER — TRESIBA FLEXTOUCH 200 UNIT/ML ~~LOC~~ SOPN: 50.0000 [IU] | PEN_INJECTOR | Freq: Every day | SUBCUTANEOUS | 3 refills | Status: AC

## 2023-10-31 MED ORDER — EMPAGLIFLOZIN 25 MG PO TABS
25.0000 mg | ORAL_TABLET | Freq: Every day | ORAL | 3 refills | Status: AC
  Filled 2023-10-31 – 2023-11-22 (×2): qty 90, 90d supply, fill #0

## 2023-10-31 NOTE — Progress Notes (Unsigned)
Outpatient Endocrinology Note Tim Larkin Alfred, MD  11/01/23  Patient's Name: Tim Wilson    DOB: 25-Oct-1965    MRN: [id number]                                                    REASON OF VISIT: Follow up of type 2 diabetes mellitus  PCP: Etta Grandchild, MD  HISTORY OF PRESENT ILLNESS:   [name] is a 58 y.o. old male with past medical history listed below, is here for follow up for type 2 diabetes mellitus.   Pertinent Diabetes History: Patient was diagnosed with type 2 diabetes mellitus in 2012.  He was initially on insulin therapy at the time of diagnosis however later regularly started in 2019.  He has uncontrolled type 2 diabetes mellitus hemoglobin A1c in the range of 8 to 14.6%.  Chronic Diabetes Complications : Retinopathy: no. Last ophthalmology exam was done on annually, following with ophthalmology regularly.  Nephropathy: Microalbuminuria present, on ACE/ARB  Peripheral neuropathy: sometimes numbness / tingling. Coronary artery disease: no Stroke: no  Relevant comorbidities and cardiovascular risk factors: Obesity: yes Body mass index is 30.74 kg/m.  Hypertension: Yes  Hyperlipidemia : Yes, on statin / Vasepa/   Current / Home Diabetic regimen includes: Tresiba 50 units daily.  Humalog 10 units 3 times a day. Actos 15 mg daily. Jardiance 10 mg daily.  Prior diabetic medications: Metformin, glimepiride, Trulicity, Rybelsus, Januvia in the past.  Diarrhea with metformin.  Marcelline Deist he stopped was expensive.  Glycemic data:   Forgot to bring glucometer, no glucose data to review. He had used Dexcom in the past.  Hypoglycemia: Patient has no hypoglycemic episodes. Patient has hypoglycemia awareness.  Factors modifying glucose control: 1.  Diabetic diet assessment: 3 meals a day.  2.  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  3.  Medication compliance: compliant most of the time.  Interval  history  Diabetes regimen as noted above.  He reports compliance with insulin regimen.  Hemoglobin A1c worsening 10.7%.  Denies complaints of numbness and ting of the feet.  No vision problem.  No other complaints today.  REVIEW OF SYSTEMS As per history of present illness.   PAST MEDICAL HISTORY: Past Medical History:  Diagnosis Date   Diabetes mellitus    Hyperlipidemia    Hypertension     PAST SURGICAL HISTORY: History reviewed. No pertinent surgical history.  ALLERGIES: Allergies  Allergen Reactions   Metformin And Related Diarrhea   Ramipril     cough    FAMILY HISTORY:  Family History  Problem Relation Age of Onset   Heart disease Father    Heart attack Father 4   Heart disease Brother    Heart attack Brother 93   Diabetes Mother    Heart attack Mother    Breast cancer Cousin    Heart attack Paternal Uncle    Heart attack Paternal Uncle    Stroke Paternal Uncle    Heart attack Paternal Uncle 80       5 HA in the past    Colon cancer Neg Hx    Stomach cancer Neg Hx    Esophageal cancer Neg Hx     SOCIAL HISTORY: Social History   Socioeconomic History   Marital status: Married  Spouse name: Not on file   Number of children: 5   Years of education: Not on file   Highest education level: Not on file  Occupational History   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  Tobacco Use   Smoking status: Never   Smokeless tobacco: Never  Vaping Use   Vaping status: Never Used  Substance and Sexual Activity   Alcohol use: Yes    Alcohol/week: 2.0 standard drinks of alcohol    Types: 2 Cans of beer per week    Comment: on occasion, not in excess   Drug use: No   Sexual activity: Yes  Other Topics Concern   Not on file  Social History Narrative   Regular exercise-yes   Social Drivers of Health   Financial Resource Strain: Not on file  Food Insecurity: Not on file   Transportation Needs: Not on file  Physical Activity: Not on file  Stress: Not on file  Social Connections: Not on file    MEDICATIONS:  Current Outpatient Medications  Medication Sig Dispense Refill   Accu-Chek Softclix Lancets lancets Use to check blood sugar twice a day 200 each 3   aspirin EC 325 MG tablet Take by mouth.     Cholecalciferol 1.25 MG (50000 UT) capsule Take 1 capsule (50,000 Units total) by mouth once a week. 12 capsule 0   Continuous Glucose Sensor (DEXCOM G7 SENSOR) MISC Use as directed and change sensor every 10 days. 9 each 3   Continuous Glucose Sensor (DEXCOM G7 SENSOR) MISC Use as directed for continuous glucose monitoring. 9 each 3   dapagliflozin propanediol (FARXIGA) 10 MG TABS tablet Take 1 tablet (10 mg total) by mouth daily before breakfast. 90 tablet 1   empagliflozin (JARDIANCE) 25 MG TABS tablet Take 1 tablet (25 mg total) by mouth daily before breakfast. 90 tablet 3   fenofibrate (TRICOR) 145 MG tablet Take 1 tablet (145 mg total) by mouth daily. 90 tablet 1   glucose blood (ACCU-CHEK GUIDE) test strip Use to check blood sugar 2 (two) times daily. 200 each 3   Insulin Pen Needle (TECHLITE PEN NEEDLES) 32G X 6 MM MISC Inject into the skin 4 (four) times daily. 300 each 1   olmesartan (BENICAR) 20 MG tablet Take 1 tablet (20 mg total) by mouth daily. 90 tablet 0   omega-3 acid ethyl esters (LOVAZA) 1 g capsule Take 2 capsules (2 g total) by mouth 2 (two) times daily. 360 capsule 1   pioglitazone (ACTOS) 15 MG tablet Take 1 tablet (15 mg total) by mouth daily. 90 tablet 1   rosuvastatin (CRESTOR) 20 MG tablet Take 1 tablet (20 mg total) by mouth daily. 90 tablet 0   triamterene-hydrochlorothiazide (DYAZIDE) 37.5-25 MG capsule Take 1 each (1 capsule total) by mouth daily. 90 capsule 0   insulin degludec (TRESIBA FLEXTOUCH) 200 UNIT/ML FlexTouch Pen Inject 50 Units into the skin daily. 45 mL 3   insulin lispro (HUMALOG KWIKPEN) 100 UNIT/ML KwikPen INJECT 10  UNITS WITH BREAKFAST, 10 units with lunch and 15 units with supper 30 mL 2   metoprolol succinate (TOPROL-XL) 25 MG 24 hr tablet Take 1 tablet (25 mg total) by mouth daily. 90 tablet 0   No current facility-administered medications for this visit.    PHYSICAL EXAM: Vitals:   10/31/23 0956  BP: 134/80  Pulse: 81  Resp: 20  SpO2: 97%  Weight: 202 lb  3.2 oz (91.7 kg)  Height: 5\' 8"  (1.727 m)   Body mass index is 30.74 kg/m.  Wt Readings from Last 3 Encounters:  10/31/23 202 lb 3.2 oz (91.7 kg)  07/29/23 208 lb 12.8 oz (94.7 kg)  04/28/23 200 lb 3.2 oz (90.8 kg)    General: Well developed, well nourished male in no apparent distress.  HEENT: AT/[place], no external lesions.  Eyes: Conjunctiva clear and no icterus. Neck: Neck supple  Lungs: Respirations not labored Neurologic: Alert, oriented, normal speech Extremities / Skin: Dry.   Psychiatric: Does not appear depressed or anxious  Diabetic Foot Exam - Simple   Simple Foot Form Diabetic Foot exam was performed with the following findings: Yes 10/31/2023 10:23 AM  Visual Inspection No deformities, no ulcerations, no other skin breakdown bilaterally: Yes Sensation Testing Intact to touch and monofilament testing bilaterally: Yes Pulse Check Posterior Tibialis and Dorsalis pulse intact bilaterally: Yes Comments    LABS Reviewed Lab Results  Component Value Date   HGBA1C 10.7 (H) 10/21/2023   HGBA1C 8.5 (H) 07/25/2023   HGBA1C 10.2 (H) 04/11/2023   Lab Results  Component Value Date   FRUCTOSAMINE 359 (H) 03/04/2023   FRUCTOSAMINE 262 07/26/2022   FRUCTOSAMINE 266 09/19/2015   Lab Results  Component Value Date   CHOL 215 (H) 07/25/2023   HDL 32.60 (L) 07/25/2023   LDLCALC Comment (A) 12/28/2021   LDLDIRECT 97.0 07/25/2023   TRIG (H) 07/25/2023    624.0 Triglyceride is over 400; calculations on Lipids are invalid.   CHOLHDL 7 07/25/2023   Lab Results  Component Value Date   MICRALBCREAT 14.6 07/25/2023    MICRALBCREAT 74.6 (H) 04/11/2023   Lab Results  Component Value Date   CREATININE 0.98 10/21/2023   Lab Results  Component Value Date   GFR 87.69 07/25/2023    ASSESSMENT / PLAN  1. Type 2 diabetes mellitus with hyperglycemia, with long-term current use of insulin (HCC)   2. HYPERTRIGLYCERIDEMIA      Diabetes Mellitus type 2, complicated by microalbuminuria/neuropathy. - Diabetic status / severity: Uncontrolled, improving.  Lab Results  Component Value Date   HGBA1C 10.7 (H) 10/21/2023    - Hemoglobin A1c goal : <7%  He has history of acute pancreatitis in 2019 will avoid GLP-1 receptor agonist.  Discussed about compliance with diabetes regimen.  Discussed about proper intake of Humalog.  - Medications:  Continue Tresiba 50 units daily.  Advised to take Humalog 10 units with meals continuing significant amount of carbohydrate, not with snacks or a small meal.   Continue Actos 15 mg daily. Increase Jardiance from 10 mg to 25 mg daily.  - Home glucose testing: Before meals and at bedtime.  Represcribed Dexcom G7 CGM. - Discussed/ Gave Hypoglycemia treatment plan.  # Consult : not required at this time.   # Annual urine for microalbuminuria/ creatinine ratio, no microalbuminuria currently, continue ACE/ARB /olmesartan. Last  Lab Results  Component Value Date   MICRALBCREAT 14.6 07/25/2023    # Foot check nightly / neuropathy.  # Annual dilated diabetic eye exams.   - Diet: Eat reasonable portion sizes to promote a healthy weight - Life style / activity / exercise: Discussed.  2. Blood pressure  -  BP Readings from Last 1 Encounters:  10/31/23 134/80    - Control is not in target.  - No change in current plans.  Discussed about importance of blood pressure control.  Patient will talk with primary care provider.  3. Lipid status /  Hyperlipidemia /hypertriglyceridemia - Last  Lab Results  Component Value Date   LDLCALC Comment (A) 12/28/2021   -  Continue rosuvastatin 20 mg daily.  Fenofibrate 145 mg daily.  Tillman Sers Riesa Pope daily.    Diagnoses and all orders for this visit:  Type 2 diabetes mellitus with hyperglycemia, with long-term current use of insulin (HCC) -     insulin degludec (TRESIBA FLEXTOUCH) 200 UNIT/ML FlexTouch Pen; Inject 50 Units into the skin daily. -     insulin lispro (HUMALOG KWIKPEN) 100 UNIT/ML KwikPen; INJECT 10 UNITS WITH BREAKFAST, 10 units with lunch and 15 units with supper -     Continuous Glucose Sensor (DEXCOM G7 SENSOR) MISC; Use as directed for continuous glucose monitoring.  HYPERTRIGLYCERIDEMIA -     insulin lispro (HUMALOG KWIKPEN) 100 UNIT/ML KwikPen; INJECT 10 UNITS WITH BREAKFAST, 10 units with lunch and 15 units with supper  Other orders -     empagliflozin (JARDIANCE) 25 MG TABS tablet; Take 1 tablet (25 mg total) by mouth daily before breakfast.     DISPOSITION Follow up in clinic in 3  months suggested.   All questions answered and patient verbalized understanding of the plan.  Tim Ilai Hiller, MD [facility] Endocrinology [facility] [address] [place], [address] [place], [place] [address] Phone # [phone]  At least part of this note was generated using voice recognition software. Inadvertent word errors may have occurred, which were not recognized during the proofreading process.

## 2023-11-01 ENCOUNTER — Other Ambulatory Visit (HOSPITAL_BASED_OUTPATIENT_CLINIC_OR_DEPARTMENT_OTHER): Payer: Self-pay

## 2023-11-01 ENCOUNTER — Encounter: Payer: Self-pay | Admitting: Endocrinology

## 2023-11-02 ENCOUNTER — Other Ambulatory Visit (HOSPITAL_BASED_OUTPATIENT_CLINIC_OR_DEPARTMENT_OTHER): Payer: Self-pay

## 2023-11-03 ENCOUNTER — Other Ambulatory Visit (HOSPITAL_BASED_OUTPATIENT_CLINIC_OR_DEPARTMENT_OTHER): Payer: Self-pay

## 2023-11-04 ENCOUNTER — Other Ambulatory Visit (HOSPITAL_BASED_OUTPATIENT_CLINIC_OR_DEPARTMENT_OTHER): Payer: Self-pay

## 2023-11-07 ENCOUNTER — Other Ambulatory Visit (HOSPITAL_BASED_OUTPATIENT_CLINIC_OR_DEPARTMENT_OTHER): Payer: Self-pay

## 2023-11-09 ENCOUNTER — Other Ambulatory Visit (HOSPITAL_BASED_OUTPATIENT_CLINIC_OR_DEPARTMENT_OTHER): Payer: Self-pay

## 2023-11-10 ENCOUNTER — Other Ambulatory Visit (HOSPITAL_BASED_OUTPATIENT_CLINIC_OR_DEPARTMENT_OTHER): Payer: Self-pay

## 2023-11-11 ENCOUNTER — Other Ambulatory Visit (HOSPITAL_BASED_OUTPATIENT_CLINIC_OR_DEPARTMENT_OTHER): Payer: Self-pay

## 2023-11-14 ENCOUNTER — Other Ambulatory Visit (HOSPITAL_BASED_OUTPATIENT_CLINIC_OR_DEPARTMENT_OTHER): Payer: Self-pay

## 2023-11-15 ENCOUNTER — Other Ambulatory Visit (HOSPITAL_BASED_OUTPATIENT_CLINIC_OR_DEPARTMENT_OTHER): Payer: Self-pay

## 2023-11-16 ENCOUNTER — Other Ambulatory Visit (HOSPITAL_BASED_OUTPATIENT_CLINIC_OR_DEPARTMENT_OTHER): Payer: Self-pay

## 2023-11-17 ENCOUNTER — Other Ambulatory Visit (HOSPITAL_BASED_OUTPATIENT_CLINIC_OR_DEPARTMENT_OTHER): Payer: Self-pay

## 2023-11-18 ENCOUNTER — Other Ambulatory Visit (HOSPITAL_BASED_OUTPATIENT_CLINIC_OR_DEPARTMENT_OTHER): Payer: Self-pay

## 2023-11-21 ENCOUNTER — Other Ambulatory Visit (HOSPITAL_BASED_OUTPATIENT_CLINIC_OR_DEPARTMENT_OTHER): Payer: Self-pay

## 2023-11-22 ENCOUNTER — Other Ambulatory Visit (HOSPITAL_BASED_OUTPATIENT_CLINIC_OR_DEPARTMENT_OTHER): Payer: Self-pay

## 2023-11-23 ENCOUNTER — Other Ambulatory Visit (HOSPITAL_BASED_OUTPATIENT_CLINIC_OR_DEPARTMENT_OTHER): Payer: Self-pay

## 2023-11-29 ENCOUNTER — Other Ambulatory Visit (HOSPITAL_BASED_OUTPATIENT_CLINIC_OR_DEPARTMENT_OTHER): Payer: Self-pay

## 2023-11-29 ENCOUNTER — Other Ambulatory Visit (HOSPITAL_COMMUNITY): Payer: Self-pay

## 2023-11-29 ENCOUNTER — Telehealth: Payer: Self-pay | Admitting: Pharmacy Technician

## 2023-11-29 NOTE — Telephone Encounter (Signed)
 Pharmacy Patient Advocate Encounter   Received notification from CoverMyMeds that prior authorization for Dexcom G7 sensor is required/requested.   Insurance verification completed.   The patient is insured through [facility] .   Per test claim: PA required; PA submitted to above mentioned insurance via CoverMyMeds Key/confirmation #/EOC [id number] Status is pending

## 2023-12-02 ENCOUNTER — Other Ambulatory Visit (HOSPITAL_BASED_OUTPATIENT_CLINIC_OR_DEPARTMENT_OTHER): Payer: Self-pay

## 2023-12-12 ENCOUNTER — Other Ambulatory Visit (HOSPITAL_COMMUNITY): Payer: Self-pay

## 2023-12-12 NOTE — Telephone Encounter (Signed)
 Pharmacy Patient Advocate Encounter  Received notification from [facility] that Prior Authorization for Dexcom G7 sensor has been APPROVED from 11/29/23 to 11/28/24 RX is ready to pick up at the pharmacy, but it's over $1000  PA #/Case ID/Reference #: PA Case ID #: [id number]

## 2023-12-14 ENCOUNTER — Other Ambulatory Visit (HOSPITAL_BASED_OUTPATIENT_CLINIC_OR_DEPARTMENT_OTHER): Payer: Self-pay

## 2024-01-31 ENCOUNTER — Ambulatory Visit: Payer: BLUE CROSS/BLUE SHIELD | Admitting: Endocrinology

## 2024-03-02 ENCOUNTER — Other Ambulatory Visit: Payer: Self-pay

## 2024-03-02 ENCOUNTER — Other Ambulatory Visit (HOSPITAL_COMMUNITY): Payer: Self-pay

## 2024-03-02 ENCOUNTER — Other Ambulatory Visit (HOSPITAL_BASED_OUTPATIENT_CLINIC_OR_DEPARTMENT_OTHER): Payer: Self-pay

## 2024-03-02 ENCOUNTER — Telehealth: Payer: Self-pay

## 2024-03-02 ENCOUNTER — Emergency Department (HOSPITAL_COMMUNITY)
Admission: EM | Admit: 2024-03-02 | Discharge: 2024-03-02 | Disposition: A | Discharge status: Home / Self Care | Payer: Self-pay | Attending: Emergency Medicine | Admitting: Emergency Medicine

## 2024-03-02 DIAGNOSIS — R202 Paresthesia of skin: Secondary | ICD-10-CM | POA: Diagnosis present

## 2024-03-02 DIAGNOSIS — Z794 Long term (current) use of insulin: Secondary | ICD-10-CM | POA: Diagnosis not present

## 2024-03-02 DIAGNOSIS — Z7982 Long term (current) use of aspirin: Secondary | ICD-10-CM | POA: Insufficient documentation

## 2024-03-02 LAB — CBG MONITORING, ED: Glucose-Capillary: 342 mg/dL — ABNORMAL HIGH (ref 70–99)

## 2024-03-02 MED ORDER — KETOROLAC TROMETHAMINE 15 MG/ML IJ SOLN
15.0000 mg | Freq: Once | INTRAMUSCULAR | Status: AC
  Administered 2024-03-02: 15 mg via INTRAMUSCULAR
  Filled 2024-03-02: qty 1

## 2024-03-02 MED ORDER — METHOCARBAMOL 500 MG PO TABS
500.0000 mg | ORAL_TABLET | Freq: Three times a day (TID) | ORAL | 0 refills | Status: AC | PRN
  Filled 2024-03-02: qty 8, 3d supply, fill #0

## 2024-03-02 MED ORDER — METHOCARBAMOL 500 MG PO TABS
500.0000 mg | ORAL_TABLET | Freq: Once | ORAL | Status: AC
  Administered 2024-03-02: 500 mg via ORAL
  Filled 2024-03-02: qty 1

## 2024-03-02 MED ORDER — METHOCARBAMOL 500 MG PO TABS
500.0000 mg | ORAL_TABLET | Freq: Three times a day (TID) | ORAL | 0 refills | Status: DC | PRN
  Filled 2024-03-02: qty 8, 3d supply, fill #0

## 2024-03-02 NOTE — Discharge Instructions (Signed)
 The numbness in her hand is likely coming down from your shoulder area.  The muscle relaxers and Motrin or Tylenol  should help.  Follow-up with your doctor if needed.  Watch for changing of symptoms.  Take care of your sugars at home.

## 2024-03-02 NOTE — ED Provider Notes (Signed)
 [place] EMERGENCY DEPARTMENT AT [facility] Provider Note   CSN: [id number] Arrival date & time: 03/02/24  7829     History  Chief Complaint  Patient presents with   Numbness    Tim Wilson is a 58 y.o. male.  HPI Patient with paresthesias to right upper extremity.  Had episode yesterday continuing today.  Pain in the right shoulder.  Has not had episodes like this before.  No injury.  Is diabetic.  Sugars 300 at home.  No headache.  No weakness.    Home Medications Prior to Admission medications   Medication Sig Start Date End Date Taking? Authorizing Provider  Accu-Chek Softclix Lancets lancets Use to check blood sugar twice a day 07/24/21   Gwyndolyn Lerner, MD  aspirin  EC 325 MG tablet Take by mouth.    [provider]  Cholecalciferol  1.25 MG (50000 UT) capsule Take 1 capsule (50,000 Units total) by mouth once a week. 05/04/22   Arcadio Knuckles, MD  Continuous Glucose Sensor (DEXCOM G7 SENSOR) MISC Use as directed and change sensor every 10 days. 07/29/23   Thapa, [place], MD  Continuous Glucose Sensor (DEXCOM G7 SENSOR) MISC Use as directed for continuous glucose monitoring. 10/31/23   Thapa, [place], MD  dapagliflozin  propanediol (FARXIGA ) 10 MG TABS tablet Take 1 tablet (10 mg total) by mouth daily before breakfast. 04/28/23   Lajean Pike, MD  empagliflozin  (JARDIANCE ) 25 MG TABS tablet Take 1 tablet (25 mg total) by mouth daily before breakfast. 10/31/23   Thapa, [place], MD  fenofibrate  (TRICOR ) 145 MG tablet Take 1 tablet (145 mg total) by mouth daily. 04/28/23 04/27/24  Lajean Pike, MD  glucose blood (ACCU-CHEK GUIDE) test strip Use to check blood sugar 2 (two) times daily. 01/06/23   Lajean Pike, MD  insulin  degludec (TRESIBA  FLEXTOUCH) 200 UNIT/ML FlexTouch Pen Inject 50 Units into the skin daily. 10/31/23   Thapa, [place], MD  insulin  lispro (HUMALOG  KWIKPEN) 100 UNIT/ML KwikPen INJECT 10 UNITS WITH BREAKFAST, 10 units with lunch and 15 units with supper 10/31/23    Thapa, [place], MD  Insulin  Pen Needle (TECHLITE PEN NEEDLES) 32G X 6 MM MISC Inject into the skin 4 (four) times daily. 04/11/23   Arcadio Knuckles, MD  methocarbamol (ROBAXIN) 500 MG tablet Take 1 tablet (500 mg total) by mouth every 8 (eight) hours as needed. 03/02/24   Mozell Arias, MD  metoprolol  succinate (TOPROL -XL) 25 MG 24 hr tablet Take 1 tablet (25 mg total) by mouth daily. 04/12/23 07/11/23  Arcadio Knuckles, MD  olmesartan  (BENICAR ) 20 MG tablet Take 1 tablet (20 mg total) by mouth daily. 11/12/22   Arcadio Knuckles, MD  omega-3 acid ethyl esters (LOVAZA ) 1 g capsule Take 2 capsules (2 g total) by mouth 2 (two) times daily. 04/28/23   Lajean Pike, MD  pioglitazone  (ACTOS ) 15 MG tablet Take 1 tablet (15 mg total) by mouth daily. 04/28/23   Lajean Pike, MD  rosuvastatin  (CRESTOR ) 20 MG tablet Take 1 tablet (20 mg total) by mouth daily. 01/06/23 01/06/24  Arcadio Knuckles, MD  triamterene -hydrochlorothiazide  (DYAZIDE ) 37.5-25 MG capsule Take 1 each (1 capsule total) by mouth daily. 04/12/23   Arcadio Knuckles, MD      Allergies    Metformin  and related and Ramipril     Review of Systems   Review of Systems  Physical Exam Updated Vital Signs BP (!) 152/84 (BP Location: Right Arm)   Pulse 70   Temp 97.8 F (36.6 C) (  Oral)   Resp 16   Ht 5\' 8"  (1.727 m)   Wt 90.7 kg   SpO2 97%   BMI 30.41 kg/m  Physical Exam Vitals and nursing note reviewed.  Cardiovascular:     Rate and Rhythm: Normal rate.  Musculoskeletal:     Comments: Paresthesias over right hand.  Involves the whole hand.  Strong radial pulse.  Good strength in radial median and ulnar distribution of hand.  Good range of motion right shoulder.  Does have tenderness over trapezius.  No midline tenderness of cervical spine.  No pain down arm with axial loading of the spine.  Neurological:     Mental Status: He is alert and oriented to person, place, and time.     ED Results / Procedures / Treatments   Labs (all labs ordered are  listed, but only abnormal results are displayed) Labs Reviewed  CBG MONITORING, ED - Abnormal; Notable for the following components:      Result Value   Glucose-Capillary 342 (*)    All other components within normal limits    EKG EKG Interpretation Date/Time:  Friday Mar 02 2024 08:43:02 EDT Ventricular Rate:  64 PR Interval:  182 QRS Duration:  108 QT Interval:  388 QTC Calculation: 400 R Axis:   -52  Text Interpretation: Normal sinus rhythm Left axis deviation Abnormal ECG No previous ECGs available Confirmed by Mozell Arias [phone]) on 03/02/2024 9:06:41 AM  Radiology No results found.  Procedures Procedures    Medications Ordered in ED Medications  ketorolac (TORADOL) 15 MG/ML injection 15 mg (15 mg Intramuscular Given 03/02/24 0940)  methocarbamol (ROBAXIN) tablet 500 mg (500 mg Oral Given 03/02/24 0940)    ED Course/ Medical Decision Making/ A&P                                 Medical Decision Making Risk Prescription drug management.   Patient with paresthesias down the right hand.  Began yesterday.  Appears to be more of a peripheral neuropathy.  Potentially could be of some cervical or shoulder area origin.  Does have tenderness over trapezius.  Sugars been a little elevated at home.  Will treat symptomatically and check CBG.  CBG is elevated.  Vitals reassuring and patient can manage with this insulin  home.  Feeling somewhat better after treatment.  Will discharge home.  Treat with muscle laxer.         Final Clinical Impression(s) / ED Diagnoses Final diagnoses:  Paresthesia    Rx / DC Orders ED Discharge Orders          Ordered    methocarbamol (ROBAXIN) 500 MG tablet  Every 8 hours PRN,   Status:  Discontinued        03/02/24 1012    methocarbamol (ROBAXIN) 500 MG tablet  Every 8 hours PRN        03/02/24 1013              Mozell Arias, MD 03/02/24 1013

## 2024-03-02 NOTE — Transitions of Care (Post Inpatient/ED Visit) (Signed)
   03/02/2024  Name: LENWOOD BALSAM MRN: [id number] DOB: 1966-01-09  Today's TOC FU Call Status:    Attempted to reach the patient regarding the most recent Inpatient/ED visit.  Follow Up Plan: Additional outreach attempts will be made to reach the patient to complete the Transitions of Care (Post Inpatient/ED visit) call.   Signature Quron Ruddy,CMA

## 2024-03-02 NOTE — ED Triage Notes (Signed)
 Pt. Stated, I started having numbness in left shoulder and down my arm  and my neck. This started yesterday. This is something new.

## 2024-03-05 NOTE — Telephone Encounter (Signed)
 Copied from CRM [id number]. Topic: Clinical - Medication Question >> Mar 02, 2024  3:17 PM Tim Wilson wrote: Reason for CRM: Patient received a call from his PCP's office as a follow up to his ER visit this morning.

## 2024-03-06 ENCOUNTER — Other Ambulatory Visit: Payer: Self-pay | Admitting: Internal Medicine

## 2024-03-08 ENCOUNTER — Telehealth: Payer: Self-pay

## 2024-03-08 ENCOUNTER — Other Ambulatory Visit (HOSPITAL_COMMUNITY): Payer: Self-pay

## 2024-03-08 ENCOUNTER — Encounter (HOSPITAL_COMMUNITY): Payer: Self-pay

## 2024-03-08 NOTE — Transitions of Care (Post Inpatient/ED Visit) (Signed)
   03/08/2024  Name: Tim Wilson MRN: [id number] DOB: [birth year]  Today's TOC FU Call Status: Today's TOC FU Call Status:: Unsuccessful Call (2nd Attempt) Unsuccessful Call (2nd Attempt) Date: 03/08/24  Attempted to reach the patient regarding the most recent Inpatient/ED visit.  Follow Up Plan: No further outreach attempts will be made at this time. We have been unable to contact the patient.  Signature Heidee Audi,CMA

## 2024-03-08 NOTE — Telephone Encounter (Signed)
 2nd attempt to contact patient and was unavailable
